# Patient Record
Sex: Female | Born: 1945
Health system: Southern US, Community
[De-identification: ages and names within clinical notes are randomized; demographics above are authoritative.]

## PROBLEM LIST (undated history)

## (undated) DIAGNOSIS — N952 Postmenopausal atrophic vaginitis: Secondary | ICD-10-CM

## (undated) DIAGNOSIS — R8281 Pyuria: Secondary | ICD-10-CM

## (undated) DIAGNOSIS — I10 Essential (primary) hypertension: Secondary | ICD-10-CM

## (undated) DIAGNOSIS — R002 Palpitations: Secondary | ICD-10-CM

## (undated) DIAGNOSIS — D1803 Hemangioma of intra-abdominal structures: Secondary | ICD-10-CM

## (undated) DIAGNOSIS — Z8739 Personal history of other diseases of the musculoskeletal system and connective tissue: Secondary | ICD-10-CM

## (undated) DIAGNOSIS — G4700 Insomnia, unspecified: Secondary | ICD-10-CM

## (undated) DIAGNOSIS — IMO0001 Reserved for inherently not codable concepts without codable children: Secondary | ICD-10-CM

## (undated) DIAGNOSIS — K5790 Diverticulosis of intestine, part unspecified, without perforation or abscess without bleeding: Secondary | ICD-10-CM

## (undated) DIAGNOSIS — N390 Urinary tract infection, site not specified: Secondary | ICD-10-CM

## (undated) DIAGNOSIS — I499 Cardiac arrhythmia, unspecified: Secondary | ICD-10-CM

## (undated) DIAGNOSIS — Z8249 Family history of ischemic heart disease and other diseases of the circulatory system: Secondary | ICD-10-CM

## (undated) DIAGNOSIS — E785 Hyperlipidemia, unspecified: Secondary | ICD-10-CM

## (undated) DIAGNOSIS — I341 Nonrheumatic mitral (valve) prolapse: Secondary | ICD-10-CM

## (undated) DIAGNOSIS — R011 Cardiac murmur, unspecified: Secondary | ICD-10-CM

## (undated) DIAGNOSIS — M1990 Unspecified osteoarthritis, unspecified site: Secondary | ICD-10-CM

## (undated) HISTORY — DX: Palpitations: R00.2

## (undated) HISTORY — DX: Insomnia, unspecified: G47.00

## (undated) HISTORY — DX: Hyperlipidemia, unspecified: E78.5

## (undated) HISTORY — DX: Cardiac arrhythmia, unspecified: I49.9

## (undated) HISTORY — DX: Reserved for inherently not codable concepts without codable children: IMO0001

## (undated) HISTORY — PX: ABDOMINAL HYSTERECTOMY: SHX81

## (undated) HISTORY — DX: Personal history of other diseases of the musculoskeletal system and connective tissue: Z87.39

## (undated) HISTORY — DX: Essential (primary) hypertension: I10

## (undated) HISTORY — PX: PARTIAL HYSTERECTOMY: SHX80

## (undated) HISTORY — DX: Postmenopausal atrophic vaginitis: N95.2

## (undated) HISTORY — DX: Pyuria: R82.81

## (undated) HISTORY — DX: Cardiac murmur, unspecified: R01.1

## (undated) HISTORY — DX: Family history of ischemic heart disease and other diseases of the circulatory system: Z82.49

## (undated) HISTORY — PX: TONSILLECTOMY: SHX5217

## (undated) HISTORY — DX: Nonrheumatic mitral (valve) prolapse: I34.1

## (undated) HISTORY — DX: Hemangioma of intra-abdominal structures: D18.03

## (undated) HISTORY — DX: Urinary tract infection, site not specified: N39.0

---

## 1998-06-07 DIAGNOSIS — N952 Postmenopausal atrophic vaginitis: Secondary | ICD-10-CM | POA: Insufficient documentation

## 1998-07-02 ENCOUNTER — Encounter: Admission: RE | Admit: 1998-07-02 | Discharge: 1998-07-02 | Payer: Self-pay | Admitting: Infectious Diseases

## 1998-07-05 ENCOUNTER — Ambulatory Visit (HOSPITAL_COMMUNITY): Admission: RE | Admit: 1998-07-05 | Discharge: 1998-07-05 | Payer: Self-pay | Admitting: Internal Medicine

## 1998-07-16 ENCOUNTER — Encounter: Admission: RE | Admit: 1998-07-16 | Discharge: 1998-07-16 | Payer: Self-pay | Admitting: Infectious Diseases

## 1998-07-27 ENCOUNTER — Ambulatory Visit (HOSPITAL_COMMUNITY): Admission: RE | Admit: 1998-07-27 | Discharge: 1998-07-27 | Payer: Self-pay | Admitting: Gastroenterology

## 1998-08-06 ENCOUNTER — Encounter: Admission: RE | Admit: 1998-08-06 | Discharge: 1998-08-06 | Payer: Self-pay | Admitting: Infectious Diseases

## 1999-06-16 ENCOUNTER — Encounter: Payer: Self-pay | Admitting: Gastroenterology

## 1999-06-16 ENCOUNTER — Encounter: Admission: RE | Admit: 1999-06-16 | Discharge: 1999-06-16 | Payer: Self-pay | Admitting: Gastroenterology

## 1999-08-04 ENCOUNTER — Other Ambulatory Visit: Admission: RE | Admit: 1999-08-04 | Discharge: 1999-08-04 | Payer: Self-pay | Admitting: Obstetrics and Gynecology

## 2000-07-09 ENCOUNTER — Encounter: Admission: RE | Admit: 2000-07-09 | Discharge: 2000-07-09 | Payer: Self-pay | Admitting: Gastroenterology

## 2000-07-09 ENCOUNTER — Encounter: Payer: Self-pay | Admitting: Gastroenterology

## 2001-07-12 ENCOUNTER — Encounter: Admission: RE | Admit: 2001-07-12 | Discharge: 2001-07-12 | Payer: Self-pay | Admitting: Gastroenterology

## 2001-07-12 ENCOUNTER — Encounter: Payer: Self-pay | Admitting: Gastroenterology

## 2002-07-09 ENCOUNTER — Other Ambulatory Visit: Admission: RE | Admit: 2002-07-09 | Discharge: 2002-07-09 | Payer: Self-pay | Admitting: Obstetrics and Gynecology

## 2002-07-30 ENCOUNTER — Encounter: Payer: Self-pay | Admitting: Gastroenterology

## 2002-07-30 ENCOUNTER — Encounter: Admission: RE | Admit: 2002-07-30 | Discharge: 2002-07-30 | Payer: Self-pay | Admitting: Gastroenterology

## 2003-08-24 ENCOUNTER — Encounter: Admission: RE | Admit: 2003-08-24 | Discharge: 2003-08-24 | Payer: Self-pay | Admitting: Gastroenterology

## 2004-05-01 DIAGNOSIS — IMO0001 Reserved for inherently not codable concepts without codable children: Secondary | ICD-10-CM

## 2004-05-01 HISTORY — DX: Reserved for inherently not codable concepts without codable children: IMO0001

## 2005-11-15 ENCOUNTER — Encounter: Admission: RE | Admit: 2005-11-15 | Discharge: 2005-11-15 | Payer: Self-pay | Admitting: Gastroenterology

## 2008-01-30 ENCOUNTER — Encounter: Admission: RE | Admit: 2008-01-30 | Discharge: 2008-01-30 | Payer: Self-pay | Admitting: Gastroenterology

## 2009-05-27 ENCOUNTER — Encounter: Payer: Self-pay | Admitting: Cardiology

## 2010-01-24 ENCOUNTER — Ambulatory Visit: Payer: Self-pay | Admitting: Cardiology

## 2010-02-02 ENCOUNTER — Ambulatory Visit: Payer: Self-pay | Admitting: Cardiology

## 2010-03-30 ENCOUNTER — Encounter: Admission: RE | Admit: 2010-03-30 | Discharge: 2010-03-30 | Payer: Self-pay | Admitting: Gastroenterology

## 2010-06-14 ENCOUNTER — Ambulatory Visit (INDEPENDENT_AMBULATORY_CARE_PROVIDER_SITE_OTHER): Payer: Medicare Other | Admitting: Cardiology

## 2010-06-14 DIAGNOSIS — Z79899 Other long term (current) drug therapy: Secondary | ICD-10-CM

## 2010-06-14 DIAGNOSIS — E7800 Pure hypercholesterolemia, unspecified: Secondary | ICD-10-CM

## 2010-09-05 ENCOUNTER — Other Ambulatory Visit: Payer: Self-pay | Admitting: Cardiology

## 2010-09-05 DIAGNOSIS — I059 Rheumatic mitral valve disease, unspecified: Secondary | ICD-10-CM

## 2010-09-05 DIAGNOSIS — Z79899 Other long term (current) drug therapy: Secondary | ICD-10-CM

## 2010-09-05 DIAGNOSIS — E7800 Pure hypercholesterolemia, unspecified: Secondary | ICD-10-CM

## 2010-10-12 ENCOUNTER — Encounter: Payer: Self-pay | Admitting: *Deleted

## 2010-10-12 ENCOUNTER — Other Ambulatory Visit: Payer: Self-pay | Admitting: Cardiology

## 2010-10-12 ENCOUNTER — Other Ambulatory Visit (INDEPENDENT_AMBULATORY_CARE_PROVIDER_SITE_OTHER): Payer: Medicare Other | Admitting: *Deleted

## 2010-10-12 DIAGNOSIS — I059 Rheumatic mitral valve disease, unspecified: Secondary | ICD-10-CM

## 2010-10-12 DIAGNOSIS — Z79899 Other long term (current) drug therapy: Secondary | ICD-10-CM

## 2010-10-12 DIAGNOSIS — E7800 Pure hypercholesterolemia, unspecified: Secondary | ICD-10-CM

## 2010-10-12 LAB — LIPID PANEL
Cholesterol: 259 mg/dL — ABNORMAL HIGH (ref 0–200)
HDL: 65.3 mg/dL (ref >39.00)
Total CHOL/HDL Ratio: 4
VLDL: 24.6 mg/dL (ref 0.0–40.0)

## 2010-10-12 LAB — HEPATIC FUNCTION PANEL
ALT: 15 U/L (ref 0–35)
Albumin: 4.7 g/dL (ref 3.5–5.2)
Total Bilirubin: 1.1 mg/dL (ref 0.3–1.2)

## 2010-10-12 LAB — BASIC METABOLIC PANEL
CO2: 31 mEq/L (ref 19–32)
Chloride: 105 mEq/L (ref 96–112)
GFR: 84.82 mL/min (ref >60.00)
Glucose, Bld: 98 mg/dL (ref 70–99)
Sodium: 141 mEq/L (ref 135–145)

## 2010-10-14 ENCOUNTER — Other Ambulatory Visit: Payer: Medicare Other | Admitting: *Deleted

## 2010-10-14 ENCOUNTER — Other Ambulatory Visit: Payer: Self-pay | Admitting: *Deleted

## 2010-10-14 ENCOUNTER — Encounter: Payer: Self-pay | Admitting: Cardiology

## 2010-10-14 ENCOUNTER — Ambulatory Visit (INDEPENDENT_AMBULATORY_CARE_PROVIDER_SITE_OTHER): Payer: Medicare Other | Admitting: Cardiology

## 2010-10-14 DIAGNOSIS — D1803 Hemangioma of intra-abdominal structures: Secondary | ICD-10-CM

## 2010-10-14 DIAGNOSIS — I059 Rheumatic mitral valve disease, unspecified: Secondary | ICD-10-CM

## 2010-10-14 DIAGNOSIS — E7800 Pure hypercholesterolemia, unspecified: Secondary | ICD-10-CM

## 2010-10-14 DIAGNOSIS — I341 Nonrheumatic mitral (valve) prolapse: Secondary | ICD-10-CM

## 2010-10-14 MED ORDER — ATORVASTATIN CALCIUM 10 MG PO TABS: 10.0000 mg | ORAL_TABLET | Freq: Every day | ORAL | Status: DC

## 2010-10-14 NOTE — Progress Notes (Signed)
[name] Date of Birth:  December 14, 1945 [facility] / [facility] [address]   [address] [place], [place]  [address] [phone]           Fax   [phone]  HPI: This pleasant 65 year old woman is seen for a four-month followup office visit.  She has a history of mitral valve prolapse and hypercholesterolemia.  She has not been expressing any symptoms from her mitral valve prolapse.  She denies any palpitations or chest pain.  Her last echocardiogram was 12/02/03 and showed normal left ventricular function and mild mitral valve prolapse.  She does not have any history of ischemic heart disease.  She had a normal treadmill Cardiolite stress test on 12/23/04.She has a history of hypercholesterolemia.  In the past she had been on Lipitor but decided that she did not want to be on it.  She was concerned about possible long-term sequelae.  However now with her LDL cholesterol above 173 she is willing to go back on low dose of Lipitor.  Current Outpatient Prescriptions  Medication Sig Dispense Refill  . Aspirin Buf,CaCarb-MgCarb-MgO, (BUFFERIN PO) Take 1 tablet by mouth as needed.        Marland Kitchen atenolol (TENORMIN) 50 MG tablet Take 25 mg by mouth daily.        Marland Kitchen CALCIUM PO Take 3 tablets by mouth daily. (  Bone Up )       . cetirizine (ZYRTEC) 10 MG tablet Take 10 mg by mouth daily.        Marland Kitchen estradiol (ESTRING) 2 MG vaginal ring Place 2 mg vaginally every 3 (three) months. follow package directions       . multivitamin (THERAGRAN) per tablet Take 1 tablet by mouth daily.        . Nutritional Supplements (MELATONIN PO) Take 2 mg by mouth at bedtime.        Marland Kitchen atorvastatin (LIPITOR) 10 MG tablet Take 1 tablet (10 mg total) by mouth daily. Take 1/2 tablet daily  45 tablet  3    Allergies  Allergen Reactions  . Ceclor (Cefaclor)   . Erythromycin   . Flagyl (Metronidazole Hcl)   . Levaquin   . Restoril     Patient Active Problem List  Diagnoses  . Mitral valve prolapse  .  Hypercholesterolemia  . Hepatic hemangioma    History  Smoking status  . Never Smoker   Smokeless tobacco  . Never Used    History  Alcohol Use No    Family History  Problem Relation Age of Onset  . Arthritis Father   . Cancer Father   . Arthritis Mother   . Cancer Mother     skin  . Hypertension Mother   . Stroke Mother   . Heart attack Brother   . Hypertension Brother   . Hypertension Brother   . Diabetes Brother   . Coronary artery disease Brother     CABG x1  . Multiple sclerosis Brother   . Osteopenia Mother     DDD  . Ulcers Mother   . Hyperlipidemia Mother     Review of Systems: The patient denies any heat or cold intolerance.  No weight gain or weight loss.  The patient denies headaches or blurry vision.  There is no cough or sputum production.  The patient denies dizziness.  There is no hematuria or hematochezia.  The patient denies any muscle aches or arthritis.  The patient denies any rash.  The patient denies  frequent falling or instability.  There is no history of depression or anxiety.  All other systems were reviewed and are negative.   Physical Exam: Filed Vitals:   10/14/10 1352  BP: 130/70  Pulse: 72   The general appearance feels a well-developed well-nourished woman in no distress.The head and neck exam reveals pupils equal and reactive.  Extraocular movements are full.  There is no scleral icterus.  The mouth and pharynx are normal.  The neck is supple.  The carotids reveal no bruits.  The jugular venous pressure is normal.  The  thyroid is not enlarged.  There is no lymphadenopathy.  The chest is clear to percussion and auscultation.  There are no rales or rhonchi.  Expansion of the chest is symmetrical.  The precordium is quiet.  The first heart sound is normal.  The second heart sound is physiologically split.  There is A soft apical mid systolic click with late systolic murmur grade 1/6.  There is no abnormal lift or heave.  The abdomen is soft  and nontender.  The bowel sounds are normal.  The liver and spleen are not enlarged.  There are no abdominal masses.  There are no abdominal bruits.  Extremities reveal good pedal pulses.  There is no phlebitis or edema.  There is no cyanosis or clubbing.  Strength is normal and symmetrical in all extremities.  There is no lateralizing weakness.  There are no sensory deficits.  The skin is warm and dry.  There is no rash.   Assessment / Plan: Restart statin therapy for hypercholesterolemia in the form of Lipitor 10 mg taking one half tablet daily.

## 2010-10-14 NOTE — Assessment & Plan Note (Signed)
The patient has a long history of mitral valve prolapse.  She has been on long-term beta blocker in the form of atenolol 12.5 mg daily.  Her blood pressure tends to run low he has not been expressing any chest pain.  She does not have any history of ischemic heart disease and she had a normal stress Cardiolite in 12/23/04

## 2010-10-14 NOTE — Telephone Encounter (Signed)
Faxed Rx. For Lipitor to [facility]

## 2010-10-14 NOTE — Assessment & Plan Note (Signed)
The patient is not currently on any statin therapy.  Her lipids this time were disappointingly highWith an LDL of 173.She is already watching her diet and exercise.  Because of her family history of coronary disease we will put her on low dose Lipitor in the form of half of a 10 mg tablet daily

## 2010-10-17 ENCOUNTER — Other Ambulatory Visit: Payer: Self-pay | Admitting: *Deleted

## 2010-10-17 DIAGNOSIS — E7800 Pure hypercholesterolemia, unspecified: Secondary | ICD-10-CM

## 2010-10-17 MED ORDER — ATORVASTATIN CALCIUM 10 MG PO TABS: ORAL_TABLET | ORAL | Status: DC

## 2011-02-02 DIAGNOSIS — N765 Ulceration of vagina: Secondary | ICD-10-CM | POA: Insufficient documentation

## 2011-02-09 ENCOUNTER — Ambulatory Visit (INDEPENDENT_AMBULATORY_CARE_PROVIDER_SITE_OTHER): Payer: Medicare Other | Admitting: *Deleted

## 2011-02-09 DIAGNOSIS — E7800 Pure hypercholesterolemia, unspecified: Secondary | ICD-10-CM

## 2011-02-09 LAB — BASIC METABOLIC PANEL
BUN: 17 mg/dL (ref 6–23)
Chloride: 103 mEq/L (ref 96–112)
GFR: 82.13 mL/min (ref >60.00)
Glucose, Bld: 96 mg/dL (ref 70–99)
Potassium: 3.9 mEq/L (ref 3.5–5.1)
Sodium: 139 mEq/L (ref 135–145)

## 2011-02-09 LAB — LIPID PANEL
Cholesterol: 173 mg/dL (ref 0–200)
HDL: 70.1 mg/dL (ref >39.00)
LDL Cholesterol: 81 mg/dL (ref 0–99)
Triglycerides: 109 mg/dL (ref 0.0–149.0)

## 2011-02-09 LAB — HEPATIC FUNCTION PANEL
Albumin: 4.4 g/dL (ref 3.5–5.2)
Alkaline Phosphatase: 53 U/L (ref 39–117)
Total Bilirubin: 0.8 mg/dL (ref 0.3–1.2)

## 2011-02-13 ENCOUNTER — Ambulatory Visit (INDEPENDENT_AMBULATORY_CARE_PROVIDER_SITE_OTHER): Payer: Medicare Other | Admitting: Nurse Practitioner

## 2011-02-13 ENCOUNTER — Encounter: Payer: Self-pay | Admitting: Nurse Practitioner

## 2011-02-13 VITALS — BP 138/80 | HR 66 | Resp 20 | Ht 64.5 in | Wt 127.0 lb

## 2011-02-13 DIAGNOSIS — E785 Hyperlipidemia, unspecified: Secondary | ICD-10-CM

## 2011-02-13 DIAGNOSIS — E7800 Pure hypercholesterolemia, unspecified: Secondary | ICD-10-CM

## 2011-02-13 DIAGNOSIS — I059 Rheumatic mitral valve disease, unspecified: Secondary | ICD-10-CM

## 2011-02-13 DIAGNOSIS — I341 Nonrheumatic mitral (valve) prolapse: Secondary | ICD-10-CM

## 2011-02-13 NOTE — Assessment & Plan Note (Signed)
Has had some palpitations but now doing better since her stress level has come down. She may use extra Atenolol prn. We will see her back in 4 months. Regular activity is encouraged. Patient is agreeable to this plan and will call if any problems develop in the interim.

## 2011-02-13 NOTE — Patient Instructions (Signed)
Stay on the 5 mg of Lipitor. Exercise is encouraged You may use extra Atenolol as needed for palpitations  Call for any problems  We will see you back in 6 months.

## 2011-02-13 NOTE — Assessment & Plan Note (Signed)
She has had a nice response with low dose Lipitor. She is willing to continue. Will recheck in 6 months.

## 2011-02-13 NOTE — Progress Notes (Signed)
Shannon Brennan Date of Birth: 1946/04/24   History of Present Illness: Shannon Brennan is seen today for her 4 month visit. She is seen for Dr. Patty Sermons. She is doing well. No chest pain or shortness of breath. She had noted more in the way of palpitations but it was stress related. She has had to have 2 vaginal biopsies which caused her some anxiety. She is now doing well. Exercises some. Back on low dose Lipitor and is tolerating without problems. Labs were checked recently and are reviewed with her. She has had a nice response with low dose Lipitor.   Current Outpatient Prescriptions on File Prior to Visit  Medication Sig Dispense Refill  . atenolol (TENORMIN) 50 MG tablet Take by mouth daily. Taking only a half of a 25 mg tablet      . atorvastatin (LIPITOR) 10 MG tablet Take 1/2 tablet daily  45 tablet  3  . CALCIUM PO Take 3 tablets by mouth daily. (  Bone Up )       . cetirizine (ZYRTEC) 10 MG tablet Take 10 mg by mouth daily.        . multivitamin (THERAGRAN) per tablet Take 1 tablet by mouth daily.        . Nutritional Supplements (MELATONIN PO) Take 2 mg by mouth at bedtime.        . Aspirin Buf,CaCarb-MgCarb-MgO, (BUFFERIN PO) Take 1 tablet by mouth as needed.        Marland Kitchen atorvastatin (LIPITOR) 10 MG tablet Take 10 mg by mouth. Take 1/2 tablet daily       . DISCONTD: estradiol (ESTRING) 2 MG vaginal ring Place 2 mg vaginally every 3 (three) months. follow package directions         Allergies  Allergen Reactions  . Ceclor (Cefaclor)   . Erythromycin   . Flagyl (Metronidazole Hcl)   . Levaquin   . Restoril     Past Medical History  Diagnosis Date  . Hyperlipidemia     tolerates low dose Lipitor  . Mitral valve prolapse     last echo in 2005 showing mild MVP with normal LV function  . Hepatic hemangioma     history of -- followe by Dr. Matthias Hughs  . Palpitations   . Family history of cardiovascular disease   . Normal nuclear stress test 2006    Past Surgical History    Procedure Date  . Partial hysterectomy   . Tonsillectomy   . Shoulder surgery     left shoulder, by Dr. Darrelyn Hillock    History  Smoking status  . Never Smoker   Smokeless tobacco  . Never Used    History  Alcohol Use No    Family History  Problem Relation Age of Onset  . Arthritis Father   . Cancer Father   . Arthritis Mother   . Cancer Mother     skin  . Hypertension Mother   . Stroke Mother   . Heart attack Brother   . Hypertension Brother   . Hypertension Brother   . Diabetes Brother   . Coronary artery disease Brother     CABG x1  . Multiple sclerosis Brother   . Osteopenia Mother     DDD  . Ulcers Mother   . Hyperlipidemia Mother     Review of Systems: The review of systems is per the HPI.  All other systems were reviewed and are negative.  Physical Exam: BP 138/80  Pulse 66  Resp  20  Ht 5' 4.5" (1.638 m)  Wt 127 lb (57.607 kg)  BMI 21.46 kg/m2 Patient is very pleasant and in no acute distress. Skin is warm and dry. Color is normal.  HEENT is unremarkable. Normocephalic/atraumatic. PERRL. Sclera are nonicteric. Neck is supple. No masses. No JVD. Lungs are clear. Cardiac exam shows a regular rate and rhythm. Abdomen is soft. Extremities are without edema. Gait and ROM are intact. No gross neurologic deficits noted.   LABORATORY DATA:   Assessment / Plan:

## 2011-03-14 ENCOUNTER — Other Ambulatory Visit: Payer: Self-pay | Admitting: Cardiology

## 2011-06-14 ENCOUNTER — Encounter: Payer: Self-pay | Admitting: Cardiology

## 2011-06-14 ENCOUNTER — Telehealth: Payer: Self-pay | Admitting: *Deleted

## 2011-06-14 ENCOUNTER — Ambulatory Visit (INDEPENDENT_AMBULATORY_CARE_PROVIDER_SITE_OTHER): Payer: Medicare Other | Admitting: Cardiology

## 2011-06-14 VITALS — BP 118/70 | HR 75 | Ht 64.0 in | Wt 127.0 lb

## 2011-06-14 DIAGNOSIS — I059 Rheumatic mitral valve disease, unspecified: Secondary | ICD-10-CM

## 2011-06-14 DIAGNOSIS — I341 Nonrheumatic mitral (valve) prolapse: Secondary | ICD-10-CM

## 2011-06-14 DIAGNOSIS — E7800 Pure hypercholesterolemia, unspecified: Secondary | ICD-10-CM

## 2011-06-14 LAB — BASIC METABOLIC PANEL
BUN: 18 mg/dL (ref 6–23)
Calcium: 9.6 mg/dL (ref 8.4–10.5)
Creatinine, Ser: 0.7 mg/dL (ref 0.4–1.2)
GFR: 83.33 mL/min (ref >60.00)
Glucose, Bld: 97 mg/dL (ref 70–99)
Potassium: 4.4 mEq/L (ref 3.5–5.1)
Sodium: 141 mEq/L (ref 135–145)

## 2011-06-14 LAB — LIPID PANEL
Cholesterol: 170 mg/dL (ref 0–200)
LDL Cholesterol: 84 mg/dL (ref 0–99)
Total CHOL/HDL Ratio: 2
Triglycerides: 76 mg/dL (ref 0.0–149.0)

## 2011-06-14 LAB — HEPATIC FUNCTION PANEL
ALT: 23 U/L (ref 0–35)
Total Bilirubin: 1.1 mg/dL (ref 0.3–1.2)

## 2011-06-14 NOTE — Telephone Encounter (Signed)
Message copied by Burnell Blanks on Wed Jun 14, 2011  5:28 PM ------      Message from: Cassell Clement      Created: Wed Jun 14, 2011  5:24 PM       Please report to patient.  The recent labs are stable. Continue same medication and careful diet.

## 2011-06-14 NOTE — Patient Instructions (Signed)
Will obtain labs today and call you with the results Your physician recommends that you schedule a follow-up appointment in: 4 months with fasting labs (lp/bmet/hfp)

## 2011-06-14 NOTE — Telephone Encounter (Signed)
Advised of labs 

## 2011-06-14 NOTE — Progress Notes (Signed)
Shannon Brennan Date of Birth:  1945/05/16 [facility] [address] [place], [place]  [address] [phone]  Fax   [phone]  HPI: This pleasant 66 year old woman is seen for a scheduled followup office visit.  She has a past history of mitral valve prolapse and palpitations.  She also has a past history of hypercholesterolemia and she has a history of hepatic hemangiomas.  His last visit she has been feeling well.  Her weight has been stable.  She has managed to make it through the winter so far without serious upper respiratory illness.  Current Outpatient Prescriptions  Medication Sig Dispense Refill  . atenolol (TENORMIN) 25 MG tablet TAKE 1 TABLET DAILY OR AS DIRECTED  90 tablet  2  . atorvastatin (LIPITOR) 10 MG tablet Take 1/2 tablet daily  45 tablet  3  . CALCIUM PO Take 3 tablets by mouth daily. (  Bone Up )       . cetirizine (ZYRTEC) 10 MG tablet Take 10 mg by mouth daily.        . Estradiol 10 MCG TABS Place vaginally 2 (two) times a week.        . multivitamin (THERAGRAN) per tablet Take 1 tablet by mouth daily.        . Nutritional Supplements (MELATONIN PO) Take 2 mg by mouth at bedtime.        Marland Kitchen atorvastatin (LIPITOR) 10 MG tablet Take 10 mg by mouth. Take 1/2 tablet daily       . DISCONTD: atenolol (TENORMIN) 50 MG tablet Take by mouth daily. Taking only a half of a 25 mg tablet        Allergies  Allergen Reactions  . Ceclor (Cefaclor)   . Erythromycin   . Flagyl (Metronidazole Hcl)   . Levaquin   . Restoril     Patient Active Problem List  Diagnoses  . Mitral valve prolapse  . Hypercholesterolemia  . Hepatic hemangioma    History  Smoking status  . Never Smoker   Smokeless tobacco  . Never Used    History  Alcohol Use No    Family History  Problem Relation Age of Onset  . Arthritis Father   . Cancer Father   . Arthritis Mother   . Cancer Mother     skin  . Hypertension Mother   . Stroke Mother   . Heart attack  Brother   . Hypertension Brother   . Hypertension Brother   . Diabetes Brother   . Coronary artery disease Brother     CABG x1  . Multiple sclerosis Brother   . Osteopenia Mother     DDD  . Ulcers Mother   . Hyperlipidemia Mother     Review of Systems: The patient denies any heat or cold intolerance.  No weight gain or weight loss.  The patient denies headaches or blurry vision.  There is no cough or sputum production.  The patient denies dizziness.  There is no hematuria or hematochezia.  The patient denies any muscle aches or arthritis.  The patient denies any rash.  The patient denies frequent falling or instability.  There is no history of depression or anxiety.  All other systems were reviewed and are negative.   Physical Exam: Filed Vitals:   06/14/11 0919  BP: 118/70  Pulse: 75   General appearance reveals a well-developed well-nourished woman in no distress.Pupils equal and reactive.   Extraocular Movements are full.  There is no scleral  icterus.  The mouth and pharynx are normal.  The neck is supple.  The carotids reveal no bruits.  The jugular venous pressure is normal.  The thyroid is not enlarged.  There is no lymphadenopathy.  The chest is clear to percussion and auscultation. There are no rales or rhonchi. Expansion of the chest is symmetrical.  The precordium is quiet.  The first heart sound is normal.  The second heart sound is physiologically split.  There is no murmur gallop rub and there is a sharp midsystolic click.  There is no abnormal lift or heave. The abdomen is soft and nontender. Bowel sounds are normal. The liver and spleen are not enlarged. There Are no abdominal masses. There are no bruits.  Extremities show no phlebitis or edema.  Pedal pulses are good.Strength is normal and symmetrical in all extremities.  There is no lateralizing weakness.  There are no sensory deficits.  The skin is warm and dry.  There is no rash.     Assessment /  Plan: Continue same medication.  Blood work today pending.  Recheck in 4 months

## 2011-06-14 NOTE — Assessment & Plan Note (Signed)
The patient has a history of hypercholesterolemia.  She is taking low dose atorvastatin and is not having a significant myalgias.  Blood work today is pending.

## 2011-06-14 NOTE — Assessment & Plan Note (Signed)
She is not having symptoms from her mitral valve prolapse.  She has had no recent palpitations.  Normally she only experiences palpitations if she is extremely fatigued.  She has not been expressing any chest pain or angina.  She had a normal nuclear stress test in 2006 because of the strong family history.

## 2011-06-14 NOTE — Progress Notes (Signed)
Quick Note:  Please report to patient. The recent labs are stable. Continue same medication and careful diet. ______ 

## 2011-06-20 DIAGNOSIS — N765 Ulceration of vagina: Secondary | ICD-10-CM

## 2011-06-20 DIAGNOSIS — N952 Postmenopausal atrophic vaginitis: Secondary | ICD-10-CM

## 2011-08-02 ENCOUNTER — Encounter (INDEPENDENT_AMBULATORY_CARE_PROVIDER_SITE_OTHER): Payer: Medicare Other | Admitting: Obstetrics and Gynecology

## 2011-08-02 DIAGNOSIS — N7689 Other specified inflammation of vagina and vulva: Secondary | ICD-10-CM

## 2011-08-02 DIAGNOSIS — N72 Inflammatory disease of cervix uteri: Secondary | ICD-10-CM

## 2011-08-30 ENCOUNTER — Encounter: Payer: Self-pay | Admitting: Cardiology

## 2011-10-02 ENCOUNTER — Other Ambulatory Visit (INDEPENDENT_AMBULATORY_CARE_PROVIDER_SITE_OTHER): Payer: Medicare Other

## 2011-10-02 DIAGNOSIS — E7800 Pure hypercholesterolemia, unspecified: Secondary | ICD-10-CM

## 2011-10-02 LAB — LIPID PANEL
Cholesterol: 171 mg/dL (ref 0–200)
HDL: 61.5 mg/dL (ref >39.00)
LDL Cholesterol: 81 mg/dL (ref 0–99)
Total CHOL/HDL Ratio: 3
Triglycerides: 145 mg/dL (ref 0.0–149.0)

## 2011-10-02 LAB — HEPATIC FUNCTION PANEL
ALT: 24 U/L (ref 0–35)
Total Protein: 7.1 g/dL (ref 6.0–8.3)

## 2011-10-02 LAB — BASIC METABOLIC PANEL: GFR: 90.25 mL/min (ref >60.00)

## 2011-10-02 NOTE — Progress Notes (Signed)
Quick Note:  Please make copy of labs for patient visit. ______ 

## 2011-10-06 ENCOUNTER — Ambulatory Visit (INDEPENDENT_AMBULATORY_CARE_PROVIDER_SITE_OTHER): Payer: Medicare Other | Admitting: Cardiology

## 2011-10-06 ENCOUNTER — Encounter: Payer: Self-pay | Admitting: Cardiology

## 2011-10-06 VITALS — BP 120/70 | HR 66 | Ht 64.0 in | Wt 127.0 lb

## 2011-10-06 DIAGNOSIS — M5430 Sciatica, unspecified side: Secondary | ICD-10-CM

## 2011-10-06 DIAGNOSIS — E7800 Pure hypercholesterolemia, unspecified: Secondary | ICD-10-CM

## 2011-10-06 DIAGNOSIS — I341 Nonrheumatic mitral (valve) prolapse: Secondary | ICD-10-CM

## 2011-10-06 DIAGNOSIS — I059 Rheumatic mitral valve disease, unspecified: Secondary | ICD-10-CM

## 2011-10-06 MED ORDER — ATORVASTATIN CALCIUM 10 MG PO TABS: ORAL_TABLET | ORAL | Status: DC

## 2011-10-06 MED ORDER — ATENOLOL 25 MG PO TABS: ORAL_TABLET | ORAL | Status: DC

## 2011-10-06 NOTE — Assessment & Plan Note (Signed)
No chest pain or palpitations.  No dizziness or syncope.

## 2011-10-06 NOTE — Assessment & Plan Note (Signed)
Because of her back pain she was unable to exercise for the past several weeks and is just now getting back into regular careful walking.  She is avoiding playing volleyball which she previously had been doing

## 2011-10-06 NOTE — Assessment & Plan Note (Signed)
Her lipids are remaining stable on low-dose Lipitor.  She's not having any myalgias.

## 2011-10-06 NOTE — Patient Instructions (Signed)
Your physician recommends that you continue on your current medications as directed. Please refer to the Current Medication list given to you today. Your physician wants you to follow-up in: 4 months with fasting labs You will receive a reminder letter in the mail two months in advance. If you don't receive a letter, please call our office to schedule the follow-up appointment.  

## 2011-10-06 NOTE — Progress Notes (Signed)
Shannon Brennan Date of Birth:  01-01-1946 [facility] [address] [place], [place]  [address] [phone]  Fax   [phone]  HPI: This pleasant 66 year old woman is seen for a four-month followup office visit.  She has a past history of mitral valve prolapse and a history of hypercholesterolemia.  She also has a history of hepatic hemangiomas.  Since we last saw her she has been having a terrible time with painful sciatica.  He was unable to tolerate Flexeril or prednisone.  She finally started seeing a chiropractor who has helped her.  She has not been having any cardiac symptoms.  Current Outpatient Prescriptions  Medication Sig Dispense Refill  . atenolol (TENORMIN) 25 MG tablet One daily or as directed  90 tablet  2  . atorvastatin (LIPITOR) 10 MG tablet Take 1/2 tablet daily or as directed  90 tablet  3  . CALCIUM PO Take 3 tablets by mouth daily. (  Bone Up )       . cetirizine (ZYRTEC) 10 MG tablet Take 10 mg by mouth daily.        . Estradiol 10 MCG TABS Place vaginally 2 (two) times a week.        . multivitamin (THERAGRAN) per tablet Take 1 tablet by mouth daily.        . Nutritional Supplements (MELATONIN PO) Take 2 mg by mouth at bedtime.        Marland Kitchen DISCONTD: atenolol (TENORMIN) 25 MG tablet TAKE 1 TABLET DAILY OR AS DIRECTED  90 tablet  2  . DISCONTD: atorvastatin (LIPITOR) 10 MG tablet Take 1/2 tablet daily  45 tablet  3  . atorvastatin (LIPITOR) 10 MG tablet Take 10 mg by mouth. Take 1/2 tablet daily         Allergies  Allergen Reactions  . Ceclor (Cefaclor)   . Erythromycin   . Flagyl (Metronidazole Hcl)   . Levofloxacin   . Restoril   . Wellbutrin (Bupropion Hcl)     Patient Active Problem List  Diagnoses  . Mitral valve prolapse  . Hypercholesterolemia  . Hepatic hemangioma  . Vaginal atrophy    History  Smoking status  . Never Smoker   Smokeless tobacco  . Never Used    History  Alcohol Use No    Family History    Problem Relation Age of Onset  . Arthritis Father   . Cancer Father   . Arthritis Mother   . Cancer Mother     skin  . Hypertension Mother   . Stroke Mother   . Heart attack Brother   . Hypertension Brother   . Hypertension Brother   . Diabetes Brother   . Coronary artery disease Brother     CABG x1  . Multiple sclerosis Brother   . Osteopenia Mother     DDD  . Ulcers Mother   . Hyperlipidemia Mother     Review of Systems: The patient denies any heat or cold intolerance.  No weight gain or weight loss.  The patient denies headaches or blurry vision.  There is no cough or sputum production.  The patient denies dizziness.  There is no hematuria or hematochezia.  The patient denies any muscle aches or arthritis.  The patient denies any rash.  The patient denies frequent falling or instability.  There is no history of depression or anxiety.  All other systems were reviewed and are negative.   Physical Exam: Filed Vitals:   10/06/11 4010  BP: 120/70  Pulse: 66   appearance reveals a well-developed well-nourished woman in no distress.The head and neck exam reveals pupils equal and reactive.  Extraocular movements are full.  There is no scleral icterus.  The mouth and pharynx are normal.  The neck is supple.  The carotids reveal no bruits.  The jugular venous pressure is normal.  The  thyroid is not enlarged.  There is no lymphadenopathy.  The chest is clear to percussion and auscultation.  There are no rales or rhonchi.  Expansion of the chest is symmetrical.  The precordium is quiet.  The first heart sound is normal.  The second heart sound is physiologically split.  There is no murmur gallop rub.  There is a sharp midsystolic click and apex  There is no abnormal lift or heave.  The abdomen is soft and nontender.  The bowel sounds are normal.  The liver and spleen are not enlarged.  There are no abdominal masses.  There are no abdominal bruits.  Extremities reveal good pedal pulses.  There  is no phlebitis or edema.  There is no cyanosis or clubbing.  Strength is normal and symmetrical in all extremities.  There is no lateralizing weakness.  There are no sensory deficits.  The skin is warm and dry.  There is no rash.      Assessment / Plan: Continue same medication and be rechecked in 4 months for office visit and lab work.

## 2011-10-26 ENCOUNTER — Ambulatory Visit (INDEPENDENT_AMBULATORY_CARE_PROVIDER_SITE_OTHER): Payer: Medicare Other | Admitting: Obstetrics and Gynecology

## 2011-10-26 ENCOUNTER — Encounter: Payer: Self-pay | Admitting: Obstetrics and Gynecology

## 2011-10-26 VITALS — BP 122/64 | Ht 64.5 in | Wt 128.0 lb

## 2011-10-26 DIAGNOSIS — N952 Postmenopausal atrophic vaginitis: Secondary | ICD-10-CM

## 2011-10-26 DIAGNOSIS — Z719 Counseling, unspecified: Secondary | ICD-10-CM

## 2011-10-26 MED ORDER — ESTRADIOL 10 MCG VA TABS: 2.0000 | ORAL_TABLET | VAGINAL | Status: DC

## 2011-10-26 NOTE — Progress Notes (Signed)
Last Pap: 2004 WNL: Yes Regular Periods:no Contraception: Hysterectomy  Monthly Breast exam:yes Tetanus<22yrs:no Nl.Bladder Function:yes Daily BMs:yes Healthy Diet:yes Calcium:yes Mammogram:yes 07/2011 Exercise:yes Seatbelt: yes Abuse at home: no Stressful work:no Sigmoid-colonoscopy: 06/2007 WNL Bone density: 2009 (followed by Dr. Felipa Eth)  Subjective:    Shannon Brennan is a 66 y.o. female G2P0011 who presents for annual exam.  The patient has no complaints today. Specifically, she denies any vaginal bleeding, abnl vaginal discharge, abdominal pain, nausea or vomiting  The following portions of the patient's history were reviewed and updated as appropriate: allergies, current medications, past family history, past medical history, past social history, past surgical history and problem list.  Review of Systems Pertinent items are noted in HPI. Gastrointestinal:No change in bowel habits, no abdominal pain, no rectal bleeding Genitourinary:negative for dysuria, frequency, hematuria, nocturia and urinary incontinence    Objective:     BP 122/64  Ht 5' 4.5" (1.638 m)  Wt 128 lb (58.06 kg)  BMI 21.63 kg/m2  Weight:  Wt Readings from Last 1 Encounters:  10/26/11 128 lb (58.06 kg)     BMI: Body mass index is 21.63 kg/(m^2). General Appearance: Alert, appropriate appearance for age. No acute distress HEENT: Grossly normal Neck / Thyroid: Supple, no masses, nodes or enlargement Lungs: clear to auscultation bilaterally Back: No CVA tenderness Breast Exam: No masses or nodes.No dimpling, nipple retraction or discharge. Cardiovascular: Regular rate and rhythm. S1, S2, no murmur Gastrointestinal: Soft, non-tender, no masses or organomegaly Pelvic Exam: External genitalia: normal general appearance Vaginal: atrophic mucosa, vaginal vault, well suspended.  atrophy improved and no erosion Cervix: removed surgically Adnexa: normal bimanual exam Uterus: removed  surgically Rectovaginal: normal rectal, no masses Lymphatic Exam: Non-palpable nodes in neck, clavicular, axillary, or inguinal regions Skin: no rash or abnormalities Neurologic: Normal gait and speech, no tremor  Psychiatric: Alert and oriented, appropriate affect.    Urinalysis:Not done      Assessment:    Atrpohic vaginitis improved    Plan:    All questions answered. Continue vagifem 2times weekly   Follow-up:  for annual exam

## 2011-11-03 ENCOUNTER — Other Ambulatory Visit: Payer: Self-pay | Admitting: Obstetrics and Gynecology

## 2011-11-07 NOTE — Telephone Encounter (Signed)
vph pt 

## 2011-11-08 ENCOUNTER — Other Ambulatory Visit: Payer: Self-pay | Admitting: Obstetrics and Gynecology

## 2011-11-08 NOTE — Telephone Encounter (Signed)
Chandra/pharm/epic

## 2011-11-08 NOTE — Telephone Encounter (Signed)
Pc to pharm per telephone call. Vagifem 1 pv 2x/weekly #24 with 3rf's verified. Spoke with Kathy(pharm).

## 2011-11-08 NOTE — Telephone Encounter (Signed)
Tc to pt per telephone call. Pt informed rx verified for Vagifem 1 tablet pv 2x/weekly #24 with 3rf's. Pt voices understanding.

## 2011-12-27 ENCOUNTER — Telehealth: Payer: Self-pay | Admitting: *Deleted

## 2011-12-27 NOTE — Telephone Encounter (Signed)
Requested chart from Onalee Hua

## 2011-12-27 NOTE — Telephone Encounter (Signed)
Last EKG?

## 2011-12-28 ENCOUNTER — Other Ambulatory Visit: Payer: Self-pay | Admitting: Neurological Surgery

## 2012-01-02 NOTE — Pre-Procedure Instructions (Signed)
19 Shannon Brennan  01/02/2012   Your procedure is scheduled on:  Thurs, Sept 5 @ 7:30 AM  Report to [facility] at 5:30 AM.  Call this number if you have problems the morning of surgery: [phone]   Remember:   Do not eat food:After Midnight.  Take these medicines the morning of surgery with A SIP OF WATER: Atenolol(Tenormin),Cetirizine(Zyrtec), and Pain Pill(if needed)   Do not wear jewelry, make-up or nail polish.  Do not wear lotions, powders, or perfumes.   Do not shave 48 hours prior to surgery.   Do not bring valuables to the hospital.  Contacts, dentures or bridgework may not be worn into surgery.  Leave suitcase in the car. After surgery it may be brought to your room.  For patients admitted to the hospital, checkout time is 11:00 AM the day of discharge.   Patients discharged the day of surgery will not be allowed to drive home.  Special Instructions: CHG Shower Use Special Wash: 1/2 bottle night before surgery and 1/2 bottle morning of surgery.   Please read over the following fact sheets that you were given: Pain Booklet, Coughing and Deep Breathing, MRSA Information and Surgical Site Infection Prevention

## 2012-01-03 ENCOUNTER — Encounter (HOSPITAL_COMMUNITY): Payer: Self-pay

## 2012-01-03 ENCOUNTER — Encounter (HOSPITAL_COMMUNITY)
Admission: RE | Admit: 2012-01-03 | Discharge: 2012-01-03 | Disposition: A | Discharge status: Home / Self Care | Payer: Medicare Other | Source: Ambulatory Visit | Attending: Neurological Surgery | Admitting: Neurological Surgery

## 2012-01-03 ENCOUNTER — Ambulatory Visit (HOSPITAL_COMMUNITY)
Admission: RE | Admit: 2012-01-03 | Discharge: 2012-01-03 | Disposition: A | Discharge status: Home / Self Care | Payer: Medicare Other | Source: Ambulatory Visit | Attending: Neurological Surgery | Admitting: Neurological Surgery

## 2012-01-03 HISTORY — DX: Unspecified osteoarthritis, unspecified site: M19.90

## 2012-01-03 HISTORY — DX: Diverticulosis of intestine, part unspecified, without perforation or abscess without bleeding: K57.90

## 2012-01-03 LAB — CBC
HCT: 42.2 % (ref 36.0–46.0)
Platelets: 245 10*3/uL (ref 150–400)
RDW: 13.1 % (ref 11.5–15.5)
WBC: 10.3 10*3/uL (ref 4.0–10.5)

## 2012-01-03 LAB — SURGICAL PCR SCREEN
MRSA, PCR: NEGATIVE
Staphylococcus aureus: NEGATIVE

## 2012-01-03 LAB — BASIC METABOLIC PANEL
Chloride: 103 mEq/L (ref 96–112)
Creatinine, Ser: 0.81 mg/dL (ref 0.50–1.10)
GFR calc Af Amer: 85 mL/min — ABNORMAL LOW (ref >90)

## 2012-01-03 NOTE — Progress Notes (Addendum)
4098  Wednesday.. No orders in at this time.Marland Kitchen...will call office to remind...DA HAVE REQUESTED OV NOTES FROM DR. BRACKBILL'S OFFICE .Marland Kitchen.the patient had stress & echo done "quite some time ago" ...both of those came out normal per the patient.  But will try and get a copy...Marland KitchenDA

## 2012-01-04 ENCOUNTER — Encounter (HOSPITAL_COMMUNITY): Payer: Self-pay | Admitting: Anesthesiology

## 2012-01-04 ENCOUNTER — Encounter (HOSPITAL_COMMUNITY)
Admission: RE | Disposition: A | Discharge status: Home / Self Care | Payer: Self-pay | Source: Ambulatory Visit | Attending: Neurological Surgery

## 2012-01-04 ENCOUNTER — Inpatient Hospital Stay (HOSPITAL_COMMUNITY)
Admission: RE | Admit: 2012-01-04 | Discharge: 2012-01-04 | DRG: 491 | Disposition: A | Discharge status: Home / Self Care | Payer: Medicare Other | Source: Ambulatory Visit | Attending: Neurological Surgery | Admitting: Neurological Surgery

## 2012-01-04 ENCOUNTER — Encounter (HOSPITAL_COMMUNITY): Payer: Self-pay | Admitting: *Deleted

## 2012-01-04 ENCOUNTER — Inpatient Hospital Stay (HOSPITAL_COMMUNITY): Payer: Medicare Other | Admitting: Anesthesiology

## 2012-01-04 ENCOUNTER — Inpatient Hospital Stay (HOSPITAL_COMMUNITY): Payer: Medicare Other

## 2012-01-04 DIAGNOSIS — Z888 Allergy status to other drugs, medicaments and biological substances status: Secondary | ICD-10-CM

## 2012-01-04 DIAGNOSIS — I059 Rheumatic mitral valve disease, unspecified: Secondary | ICD-10-CM | POA: Diagnosis present

## 2012-01-04 DIAGNOSIS — M47817 Spondylosis without myelopathy or radiculopathy, lumbosacral region: Secondary | ICD-10-CM | POA: Diagnosis present

## 2012-01-04 DIAGNOSIS — Z01812 Encounter for preprocedural laboratory examination: Secondary | ICD-10-CM

## 2012-01-04 DIAGNOSIS — E7800 Pure hypercholesterolemia, unspecified: Secondary | ICD-10-CM

## 2012-01-04 DIAGNOSIS — Z881 Allergy status to other antibiotic agents status: Secondary | ICD-10-CM

## 2012-01-04 DIAGNOSIS — N952 Postmenopausal atrophic vaginitis: Secondary | ICD-10-CM

## 2012-01-04 DIAGNOSIS — M5126 Other intervertebral disc displacement, lumbar region: Principal | ICD-10-CM | POA: Diagnosis present

## 2012-01-04 DIAGNOSIS — M5127 Other intervertebral disc displacement, lumbosacral region: Secondary | ICD-10-CM

## 2012-01-04 HISTORY — PX: LUMBAR LAMINECTOMY/DECOMPRESSION MICRODISCECTOMY: SHX5026

## 2012-01-04 SURGERY — LUMBAR LAMINECTOMY/DECOMPRESSION MICRODISCECTOMY 1 LEVEL
Anesthesia: General | Site: Back | Laterality: Left | Wound class: Clean

## 2012-01-04 MED ORDER — MIDAZOLAM HCL 5 MG/5ML IJ SOLN
INTRAMUSCULAR | Status: DC | PRN
  Administered 2012-01-04: 2 mg via INTRAVENOUS

## 2012-01-04 MED ORDER — ATENOLOL 12.5 MG HALF TABLET: 12.5000 mg | ORAL_TABLET | Freq: Every day | ORAL | Status: DC

## 2012-01-04 MED ORDER — PHENYLEPHRINE HCL 10 MG/ML IJ SOLN
INTRAMUSCULAR | Status: DC | PRN
  Administered 2012-01-04 (×3): 80 ug via INTRAVENOUS
  Administered 2012-01-04: 40 ug via INTRAVENOUS
  Administered 2012-01-04: 80 ug via INTRAVENOUS

## 2012-01-04 MED ORDER — MORPHINE SULFATE 2 MG/ML IJ SOLN: 1.0000 mg | INTRAMUSCULAR | Status: DC | PRN

## 2012-01-04 MED ORDER — GLYCOPYRROLATE 0.2 MG/ML IJ SOLN
INTRAMUSCULAR | Status: DC | PRN
  Administered 2012-01-04: .4 mg via INTRAVENOUS

## 2012-01-04 MED ORDER — SODIUM CHLORIDE 0.9 % IV SOLN: 250.0000 mL | INTRAVENOUS | Status: DC

## 2012-01-04 MED ORDER — ONDANSETRON HCL 4 MG/2ML IJ SOLN
INTRAMUSCULAR | Status: DC | PRN
  Administered 2012-01-04: 4 mg via INTRAVENOUS

## 2012-01-04 MED ORDER — ACETAMINOPHEN 10 MG/ML IV SOLN
INTRAVENOUS | Status: AC
  Administered 2012-01-04: 1000 mg via INTRAVENOUS
  Filled 2012-01-04: qty 100

## 2012-01-04 MED ORDER — LIDOCAINE-EPINEPHRINE 1 %-1:100000 IJ SOLN
INTRAMUSCULAR | Status: DC | PRN
  Administered 2012-01-04: 5 mL via INTRADERMAL

## 2012-01-04 MED ORDER — SODIUM CHLORIDE 0.9 % IV SOLN
INTRAVENOUS | Status: AC
  Filled 2012-01-04: qty 500

## 2012-01-04 MED ORDER — HEMOSTATIC AGENTS (NO CHARGE) OPTIME
TOPICAL | Status: DC | PRN
  Administered 2012-01-04: 1 via TOPICAL

## 2012-01-04 MED ORDER — ACETAMINOPHEN 650 MG RE SUPP: 650.0000 mg | RECTAL | Status: DC | PRN

## 2012-01-04 MED ORDER — LACTATED RINGERS IV SOLN
INTRAVENOUS | Status: DC | PRN
  Administered 2012-01-04 (×2): via INTRAVENOUS

## 2012-01-04 MED ORDER — THROMBIN 5000 UNITS EX SOLR
CUTANEOUS | Status: DC | PRN
  Administered 2012-01-04 (×2): 5000 [IU] via TOPICAL

## 2012-01-04 MED ORDER — PROPOFOL 10 MG/ML IV EMUL
INTRAVENOUS | Status: DC | PRN
  Administered 2012-01-04: 160 mg via INTRAVENOUS

## 2012-01-04 MED ORDER — HYDROMORPHONE HCL PF 1 MG/ML IJ SOLN: 0.2500 mg | INTRAMUSCULAR | Status: DC | PRN

## 2012-01-04 MED ORDER — MENTHOL 3 MG MT LOZG
1.0000 | LOZENGE | OROMUCOSAL | Status: DC | PRN
  Filled 2012-01-04: qty 9

## 2012-01-04 MED ORDER — FENTANYL CITRATE 0.05 MG/ML IJ SOLN
INTRAMUSCULAR | Status: DC | PRN
  Administered 2012-01-04: 150 ug via INTRAVENOUS

## 2012-01-04 MED ORDER — BACITRACIN 50000 UNITS IM SOLR
INTRAMUSCULAR | Status: AC
  Filled 2012-01-04: qty 1

## 2012-01-04 MED ORDER — 0.9 % SODIUM CHLORIDE (POUR BTL) OPTIME
TOPICAL | Status: DC | PRN
  Administered 2012-01-04: 1000 mL

## 2012-01-04 MED ORDER — PHENOL 1.4 % MT LIQD: 1.0000 | OROMUCOSAL | Status: DC | PRN

## 2012-01-04 MED ORDER — ONDANSETRON HCL 4 MG/2ML IJ SOLN: 4.0000 mg | INTRAMUSCULAR | Status: DC | PRN

## 2012-01-04 MED ORDER — BUPIVACAINE HCL (PF) 0.5 % IJ SOLN
INTRAMUSCULAR | Status: DC | PRN
  Administered 2012-01-04: 5 mL
  Administered 2012-01-04: 10 mL

## 2012-01-04 MED ORDER — ROCURONIUM BROMIDE 100 MG/10ML IV SOLN
INTRAVENOUS | Status: DC | PRN
  Administered 2012-01-04: 40 mg via INTRAVENOUS

## 2012-01-04 MED ORDER — ACETAMINOPHEN 325 MG PO TABS: 650.0000 mg | ORAL_TABLET | ORAL | Status: DC | PRN

## 2012-01-04 MED ORDER — VANCOMYCIN HCL IN DEXTROSE 1-5 GM/200ML-% IV SOLN
INTRAVENOUS | Status: AC
  Administered 2012-01-04: 1000 mg via INTRAVENOUS
  Filled 2012-01-04: qty 200

## 2012-01-04 MED ORDER — HYDROCODONE-ACETAMINOPHEN 5-325 MG PO TABS
1.0000 | ORAL_TABLET | ORAL | Status: DC | PRN
  Administered 2012-01-04: 1 via ORAL
  Filled 2012-01-04: qty 1

## 2012-01-04 MED ORDER — SODIUM CHLORIDE 0.9 % IR SOLN
Status: DC | PRN
  Administered 2012-01-04: 09:00:00

## 2012-01-04 MED ORDER — ATORVASTATIN CALCIUM 10 MG PO TABS
5.0000 mg | ORAL_TABLET | Freq: Every day | ORAL | Status: DC
  Filled 2012-01-04: qty 0.5

## 2012-01-04 MED ORDER — NEOSTIGMINE METHYLSULFATE 1 MG/ML IJ SOLN
INTRAMUSCULAR | Status: DC | PRN
  Administered 2012-01-04: 3 mg via INTRAVENOUS

## 2012-01-04 MED ORDER — DIAZEPAM 5 MG PO TABS: 5.0000 mg | ORAL_TABLET | Freq: Four times a day (QID) | ORAL | Status: AC | PRN

## 2012-01-04 MED ORDER — SODIUM CHLORIDE 0.9 % IJ SOLN: 3.0000 mL | INTRAMUSCULAR | Status: DC | PRN

## 2012-01-04 MED ORDER — KETOROLAC TROMETHAMINE 15 MG/ML IJ SOLN
15.0000 mg | Freq: Four times a day (QID) | INTRAMUSCULAR | Status: DC
  Administered 2012-01-04: 15 mg via INTRAVENOUS
  Filled 2012-01-04: qty 1

## 2012-01-04 MED ORDER — SODIUM CHLORIDE 0.9 % IJ SOLN
3.0000 mL | Freq: Two times a day (BID) | INTRAMUSCULAR | Status: DC
  Administered 2012-01-04: 3 mL via INTRAVENOUS

## 2012-01-04 MED ORDER — LIDOCAINE HCL (CARDIAC) 20 MG/ML IV SOLN
INTRAVENOUS | Status: DC | PRN
  Administered 2012-01-04: 80 mg via INTRAVENOUS

## 2012-01-04 MED ORDER — DOCUSATE SODIUM 100 MG PO CAPS
100.0000 mg | ORAL_CAPSULE | Freq: Two times a day (BID) | ORAL | Status: DC
  Administered 2012-01-04: 100 mg via ORAL
  Filled 2012-01-04: qty 1

## 2012-01-04 MED ORDER — EPHEDRINE SULFATE 50 MG/ML IJ SOLN
INTRAMUSCULAR | Status: DC | PRN
  Administered 2012-01-04: 10 mg via INTRAVENOUS

## 2012-01-04 MED ORDER — DIAZEPAM 5 MG PO TABS: 5.0000 mg | ORAL_TABLET | Freq: Four times a day (QID) | ORAL | Status: DC | PRN

## 2012-01-04 SURGICAL SUPPLY — 52 items
ADH SKN CLS APL DERMABOND .7 (GAUZE/BANDAGES/DRESSINGS) ×1
BAG DECANTER FOR FLEXI CONT (MISCELLANEOUS) ×2 IMPLANT
BLADE SURG ROTATE 9660 (MISCELLANEOUS) IMPLANT
BUR ACORN 6.0 (BURR) IMPLANT
BUR MATCHSTICK NEURO 3.0 LAGG (BURR) ×2 IMPLANT
CANISTER SUCTION 2500CC (MISCELLANEOUS) ×2 IMPLANT
CLOTH BEACON ORANGE TIMEOUT ST (SAFETY) ×2 IMPLANT
CONT SPEC 4OZ CLIKSEAL STRL BL (MISCELLANEOUS) ×2 IMPLANT
DECANTER SPIKE VIAL GLASS SM (MISCELLANEOUS) ×2 IMPLANT
DERMABOND ADVANCED (GAUZE/BANDAGES/DRESSINGS) ×1
DERMABOND ADVANCED .7 DNX12 (GAUZE/BANDAGES/DRESSINGS) ×1 IMPLANT
DRAPE MICROSCOPE LEICA (MISCELLANEOUS) ×1 IMPLANT
DRAPE PED LAPAROTOMY (DRAPES) ×2 IMPLANT
DRAPE POUCH INSTRU U-SHP 10X18 (DRAPES) ×2 IMPLANT
DRAPE PROXIMA HALF (DRAPES) ×1 IMPLANT
DURAPREP 26ML APPLICATOR (WOUND CARE) ×2 IMPLANT
ELECT REM PT RETURN 9FT ADLT (ELECTROSURGICAL) ×2
ELECTRODE REM PT RTRN 9FT ADLT (ELECTROSURGICAL) ×1 IMPLANT
GAUZE SPONGE 4X4 16PLY XRAY LF (GAUZE/BANDAGES/DRESSINGS) IMPLANT
GLOVE BIOGEL PI IND STRL 8.5 (GLOVE) ×1 IMPLANT
GLOVE BIOGEL PI INDICATOR 8.5 (GLOVE) ×2
GLOVE ECLIPSE 8.5 STRL (GLOVE) ×2 IMPLANT
GLOVE EXAM NITRILE LRG STRL (GLOVE) IMPLANT
GLOVE EXAM NITRILE MD LF STRL (GLOVE) IMPLANT
GLOVE EXAM NITRILE XL STR (GLOVE) IMPLANT
GLOVE EXAM NITRILE XS STR PU (GLOVE) IMPLANT
GLOVE SS BIOGEL STRL SZ 8 (GLOVE) IMPLANT
GLOVE SUPERSENSE BIOGEL SZ 8 (GLOVE) ×1
GLOVE SURG SS PI 8.0 STRL IVOR (GLOVE) ×1 IMPLANT
GOWN BRE IMP SLV AUR LG STRL (GOWN DISPOSABLE) IMPLANT
GOWN BRE IMP SLV AUR XL STRL (GOWN DISPOSABLE) ×1 IMPLANT
GOWN STRL REIN 2XL LVL4 (GOWN DISPOSABLE) ×3 IMPLANT
KIT BASIN OR (CUSTOM PROCEDURE TRAY) ×2 IMPLANT
KIT ROOM TURNOVER OR (KITS) ×2 IMPLANT
NDL SPNL 20GX3.5 QUINCKE YW (NEEDLE) IMPLANT
NEEDLE HYPO 22GX1.5 SAFETY (NEEDLE) ×2 IMPLANT
NEEDLE SPNL 20GX3.5 QUINCKE YW (NEEDLE) ×2 IMPLANT
NS IRRIG 1000ML POUR BTL (IV SOLUTION) ×2 IMPLANT
PACK LAMINECTOMY NEURO (CUSTOM PROCEDURE TRAY) ×2 IMPLANT
PAD ARMBOARD 7.5X6 YLW CONV (MISCELLANEOUS) ×6 IMPLANT
PATTIES SURGICAL .5 X1 (DISPOSABLE) ×2 IMPLANT
RUBBERBAND STERILE (MISCELLANEOUS) ×2 IMPLANT
SPONGE GAUZE 4X4 12PLY (GAUZE/BANDAGES/DRESSINGS) ×2 IMPLANT
SPONGE SURGIFOAM ABS GEL SZ50 (HEMOSTASIS) ×2 IMPLANT
SUT VIC AB 1 CT1 18XBRD ANBCTR (SUTURE) ×1 IMPLANT
SUT VIC AB 1 CT1 8-18 (SUTURE) ×2
SUT VIC AB 2-0 CP2 18 (SUTURE) ×2 IMPLANT
SUT VIC AB 3-0 SH 8-18 (SUTURE) ×2 IMPLANT
SYR 20ML ECCENTRIC (SYRINGE) ×2 IMPLANT
TOWEL OR 17X24 6PK STRL BLUE (TOWEL DISPOSABLE) ×2 IMPLANT
TOWEL OR 17X26 10 PK STRL BLUE (TOWEL DISPOSABLE) ×2 IMPLANT
WATER STERILE IRR 1000ML POUR (IV SOLUTION) ×2 IMPLANT

## 2012-01-04 NOTE — Progress Notes (Signed)
Patient ID: Shannon Brennan, female   DOB: 1946/02/02, 66 y.o.   MRN: [id number] Incision is clean and dry patient feels well. Motor strength appears normal and distal left lower extremity both and gastroc and tibialis anterior. Patient notes some residual discomfort in the buttock and thigh on left side. She has been ambulatory and is voiding. Plan discharge home.

## 2012-01-04 NOTE — Progress Notes (Signed)
Pt given D/C instructions with Rx, verbal understanding. Pt D/C'd home @ 1740 per MD order. Rema Fendt, RN

## 2012-01-04 NOTE — Preoperative (Signed)
Beta Blockers   Reason not to administer Beta Blockers:Not Applicable 

## 2012-01-04 NOTE — Op Note (Signed)
Preoperative diagnosis: L5-S1 herniated nucleus pulposus left with left lumbar radiculopathy, and spondylolisthesis L5 Postoperative diagnosis: L5-S1 herniated nucleus pulposus with left lumbar radiculopathy, and spondylolisthesis L5 Procedure: Record discectomy L5-S1 left with decompression of left L5 and S1 nerve root using operating microscope microdissection technique Surgeon: Barnett Abu M.D. Assistant: Delma Officer M.D. Indications: Patient is a 66 year old individual who has had significant stent severe left ocular leg pain for the past couple of months it started acutely. The patient has not had previous significant back problems. She has evidence of a herniated nucleus pulposus L5-S1 on the left side and she's been advised regarding surgical decompression.  Procedure: The patient was brought to the operating room supine on a stretcher. After the smooth induction of general endotracheal anesthesia patient was carefully turned to the prone position with the bony prominences being appropriately padded and protected. The lower lumbar spine was prepped with alcohol and DuraPrep and then draped in a sterile fashion. The needle was used to localize the area of L5-S1 and a radiograph was obtained demonstrating a needle at the level of L5-S1. Skin was infiltrated with 10 cc of lidocaine 1-100,000 epinephrine mixed 50-50 with half percent Marcaine. An incision was made and carried down to the lumbar dorsal fascia the fascia was opened on the left side of the midline and a subperiosteal dissection was performed in the interlaminar space of L5-S1. The loose laminar arch of L5 was encountered identifying this area positively. A self-retaining retractor was then placed into the wound. The yellow ligament was taken up from the interlaminar space at 5 S1 and the inferior margin of the lamina of L5 was removed and a partial facetectomy was performed at L5-S1. The common dural tube was explored and the take off of  the S1 nerve root was identified and gradually mobilized. A substantial amount of free fragment was encountered at L5 S1 and this was removed. The disc space was noted to be significantly closed. He could not be entered easily. Once the S1 nerve root was freed attention was turned to the lateral recess the path of the L5 nerve root was also found to be free and clear.   After adequate decompression was obtained hemostasis and the soft tissues was verified retractor was removed the operating microscope was removed from the field and the lumbar dorsal fascia was closed with #1 and 2-0 Vicryl in interrupted fashion. 3-0 Vicryl was used to close subcuticular skin. Blood loss was estimated at less than 20 cc.

## 2012-01-04 NOTE — H&P (Signed)
CHIEF COMPLAINT:   Left buttock and left pain which started acutely in April.  HISTORY OF PRESENT ILLNESS:  Shannon Brennan is a 66 year old right-handed individual who noted that rather suddenly in April she started to develop pain in the buttock and left lower extremity.  She was hoping that it would go away but after several weeks it got so severe that she saw Dr. Vicente Males physician assistant and ultimately had some plain x-rays and was told she had a spondylolisthesis.  She continued to have pain radiating down to the foot and ultimately she was told to see an orthopedist but prior to this, she had seen Dr. Stephannie Peters, a chiropractor here in town.  She had treatments on a daily basis for several weeks and though she got some relief with the treatments, relief was not long lasting.  She ultimately saw Dr. Magnus Ivan and an MRI was ordered.  This was performed on 12/13/2011 and is reviewed in the office.  The study demonstrates the patient has a Grade 1 spondylolisthesis at the L5-S1 level and is approximately 3 mm in slip.  She had evidence of a herniated nucleus pulposus on the left side at L5-S1 which elevates and compresses the S1 nerve root.  She has been dealing with the pain and notes that recently she was started on Hydrocodone for the past three weeks and she has been using this in increasing amounts as she notes that the initial pain relief was about six hours in duration and now is only about 3 hours in duration.  In addition, she has been using about 9-12 Ibuprofen a day and notes that this is barely containing the pain.    The patient notes she has not had any weakness in the leg although initially she had been walking with a limp.  She feels now that she is walking somewhat better.  When the pain is more severe, she feels that she may indeed have a limp.    PAST MEDICAL HISTORY:  Her general health has been excellent.  She reports no significant medical issues other than a mitral valve prolapse.  She takes  some Atenolol 12.5/25 mg. q.d.  She is on Atorvastatin 5 mg. q.d., Vagifem, Zyrtec, Melatonin, Shaklee Vitamin Supplements and mineral supplements.  She has been using Hydrocodone and Ibuprofen for pain control and also uses a stool softener to combat some mild constipation.    ALLERGIES:    She notes intolerances of a number of medications including Flagyl, Levaquin, Erythromycin and Wellbutrin.    PREVIOUS SURGERIES:   Tonsillectomy, hysterectomy, breast biopsy and C-section in the past.  SOCIAL HISTORY:    She does not smoke.  She drinks alcohol on rare social occasions.  Height and weight have been stable at 5' 4.5" and 129 pounds.  REVIEW OF SYSTEMS:   Notable for wearing glasses, irregular pulse, heart murmur, high cholesterol, a number of hemangioma of the liver noted, mild change in bowel habits, history of back pain and leg pain and some nasal and inhalant allergies.     PHYSICAL EXAMINATION:  Her physical exam reveals that she stands straight and erect. She can flex forward a maximum of about 30 degrees before she experiences significant pulling in the back and left leg.  Her motor strength appears intact in the iliopsoas, quadriceps, tibialis anterior and gastrocs. On confrontational testing, her DTR's are 2+ in the patellae, trace in the left Achilles, 2+ in the right Achilles.  Straight leg raising is markedly positive at 30  degrees in the seated position.    IMPRESSION:    The patient has evidence of Grade 1 spondylolisthesis.  Today in the office to further her workup, I obtained flexion and extension and lateral lumbar spine x-rays.  This demonstrates that she has approximately 3 mm of anterolisthesis at L5 on S1 but does not appear to change between flexion and extension.    She has evidence of a herniated nucleus pulposus at L5-S1 and the vertebrae that has spondylolisthesis.  I indicated that because of the stability of the process at L5-S1, I suggest that we do a simple diskectomy  to decompress the existing S1 nerve root.  I indicated that this should help alleviate the worst of the radicular pain.  Concerns about a simple microdiskectomy are that it does not stabilize the joint which has some proneness to instability.  However at this point with the patient being 66 years old with congenital spondylolisthesis, I note she has very little to no movement between flexion and extension, I am hopeful that we can do the surgery without disrupting the joint and causing any further instability.  Concern is that over time, she could rerupture the disc.  I indicated that the chance of this occurring is about 15%.  This is regardless of whether the patient has surgery.  I noted that the major risks of the surgery include the potential for infection, the possibility of spinal cord leakage, and the fact that this nerve has to heal itself after surgery.  On rare occasions we find that patient actually do have exacerbation of pain but most times I find that they get fairly prompt relief.  Recovery from surgery takes a total of about 6-8 weeks.  During this time, however, the patient should be ambulatory and able to be up and about.  I am hopeful that she will do well with a simple diskectomy and given the duration of her symptoms and lack of improvement despite the passage of time and conservative efforts, I do not believe that she will respond well to an epidural steroid injection.  I would advise simply decompressing this process at L5-S1 on the left side.  We will plan on schedule her at the earliest convenience.

## 2012-01-04 NOTE — Transfer of Care (Signed)
Immediate Anesthesia Transfer of Care Note  Patient: Shannon Brennan  Procedure(s) Performed: Procedure(s) (LRB) with comments: LUMBAR LAMINECTOMY/DECOMPRESSION MICRODISCECTOMY 1 LEVEL (Left) - Left Lumbar Five-Sacral One Microdiskectomy  Patient Location: PACU  Anesthesia Type: General  Level of Consciousness: awake, alert  and oriented  Airway & Oxygen Therapy: Patient Spontanous Breathing and Patient connected to face mask oxygen  Post-op Assessment: Report given to PACU RN  Post vital signs: Reviewed and stable  Complications: No apparent anesthesia complications

## 2012-01-04 NOTE — Discharge Summary (Signed)
  Admitting diagnosis: Herniated nucleus pulposus L5-S1 left with left lumbar radiculopathy, spondylolysis Discharge and final diagnosis: Herniated nucleus pulposus L5-S1 left with left lumbar radiculopathy, spondylolisis Condition on discharge: Improved Prescriptions: Valium 5 mg #30 no refills Major operation: Microdiscectomy L5-S1 left with operating microscope microdissection technique

## 2012-01-04 NOTE — Anesthesia Postprocedure Evaluation (Signed)
  Anesthesia Post-op Note  Patient: Shannon Brennan  Procedure(s) Performed: Procedure(s) (LRB) with comments: LUMBAR LAMINECTOMY/DECOMPRESSION MICRODISCECTOMY 1 LEVEL (Left) - Left Lumbar Five-Sacral One Microdiskectomy  Patient Location: PACU  Anesthesia Type: General  Level of Consciousness: awake  Airway and Oxygen Therapy: Patient Spontanous Breathing  Post-op Pain: mild  Post-op Assessment: Post-op Vital signs reviewed  Post-op Vital Signs: Reviewed  Complications: No apparent anesthesia complications

## 2012-01-04 NOTE — Anesthesia Preprocedure Evaluation (Addendum)
Anesthesia Evaluation  Patient identified by MRN, date of birth, ID band Patient awake    Reviewed: Allergy & Precautions, H&P , NPO status , Patient's Chart, lab work & pertinent test results, reviewed documented beta blocker date and time   Airway Mallampati: II TM Distance: >3 FB     Dental  (+) Teeth Intact   Pulmonary neg pulmonary ROS,    Pulmonary exam normal       Cardiovascular hypertension, Pt. on medications and Pt. on home beta blockers  History of mitral valve prolapse. Work up negative in the past. CE   Neuro/Psych    GI/Hepatic negative GI ROS, Neg liver ROS,   Endo/Other    Renal/GU negative Renal ROS     Musculoskeletal   Abdominal Normal abdominal exam  (+)   Peds  Hematology negative hematology ROS (+)   Anesthesia Other Findings   Reproductive/Obstetrics                         Anesthesia Physical Anesthesia Plan  ASA: II  Anesthesia Plan: General   Post-op Pain Management:    Induction: Intravenous  Airway Management Planned: Oral ETT  Additional Equipment:   Intra-op Plan:   Post-operative Plan: Extubation in OR  Informed Consent: I have reviewed the patients History and Physical, chart, labs and discussed the procedure including the risks, benefits and alternatives for the proposed anesthesia with the patient or authorized representative who has indicated his/her understanding and acceptance.   Dental advisory given  Plan Discussed with: CRNA, Anesthesiologist and Surgeon  Anesthesia Plan Comments:         Anesthesia Quick Evaluation

## 2012-01-04 NOTE — Progress Notes (Signed)
Vitals done @ 1200 (noon)

## 2012-01-05 ENCOUNTER — Encounter (HOSPITAL_COMMUNITY): Payer: Self-pay | Admitting: Neurological Surgery

## 2012-01-05 NOTE — Telephone Encounter (Signed)
Requested chart again.

## 2012-01-12 NOTE — Telephone Encounter (Signed)
Left message to call back  

## 2012-01-15 NOTE — Telephone Encounter (Signed)
Spoke with patient and she has already had procedure

## 2012-01-23 ENCOUNTER — Encounter: Payer: Self-pay | Admitting: Cardiology

## 2012-01-24 ENCOUNTER — Encounter: Payer: Self-pay | Admitting: Cardiology

## 2012-02-01 ENCOUNTER — Other Ambulatory Visit (INDEPENDENT_AMBULATORY_CARE_PROVIDER_SITE_OTHER): Payer: Medicare Other

## 2012-02-01 ENCOUNTER — Ambulatory Visit (INDEPENDENT_AMBULATORY_CARE_PROVIDER_SITE_OTHER): Payer: Medicare Other | Admitting: Cardiology

## 2012-02-01 ENCOUNTER — Encounter: Payer: Self-pay | Admitting: Cardiology

## 2012-02-01 VITALS — BP 115/66 | HR 63 | Ht 64.0 in | Wt 129.1 lb

## 2012-02-01 DIAGNOSIS — E7800 Pure hypercholesterolemia, unspecified: Secondary | ICD-10-CM

## 2012-02-01 DIAGNOSIS — I059 Rheumatic mitral valve disease, unspecified: Secondary | ICD-10-CM

## 2012-02-01 DIAGNOSIS — I341 Nonrheumatic mitral (valve) prolapse: Secondary | ICD-10-CM

## 2012-02-01 DIAGNOSIS — D1803 Hemangioma of intra-abdominal structures: Secondary | ICD-10-CM

## 2012-02-01 LAB — HEPATIC FUNCTION PANEL
Alkaline Phosphatase: 53 U/L (ref 39–117)
Bilirubin, Direct: 0.1 mg/dL (ref 0.0–0.3)
Total Bilirubin: 1 mg/dL (ref 0.3–1.2)

## 2012-02-01 LAB — BASIC METABOLIC PANEL
BUN: 18 mg/dL (ref 6–23)
CO2: 29 mEq/L (ref 19–32)
Chloride: 106 mEq/L (ref 96–112)
Creatinine, Ser: 0.7 mg/dL (ref 0.4–1.2)

## 2012-02-01 LAB — LIPID PANEL
LDL Cholesterol: 84 mg/dL (ref 0–99)
Total CHOL/HDL Ratio: 3

## 2012-02-01 NOTE — Progress Notes (Signed)
Quick Note:  Please report to patient. The recent labs are stable. Continue same medication and careful diet. ______ 

## 2012-02-01 NOTE — Progress Notes (Signed)
[name] Date of Birth:  Feb 22, 1946 [facility] [address] [place], [place]  [address] [phone]  Fax   [phone]  HPI: This pleasant 66 year old woman is seen for a four-month followup office visit. She has a past history of mitral valve prolapse and a history of hypercholesterolemia. She also has a history of hepatic hemangiomas.  Since we last saw her she had successful surgery for a ruptured disc which was causing painful sciatica.  Dr. Danielle Dess is her neurosurgeon.  After 4 months of severe pain she was able to have her surgery and to walk unaided out of the hospital later that same afternoon.   Current Outpatient Prescriptions  Medication Sig Dispense Refill  . atenolol (TENORMIN) 25 MG tablet Take 12.5-25 mg by mouth daily.      Marland Kitchen atorvastatin (LIPITOR) 10 MG tablet Take 1/2 tablet daily or as directed  90 tablet  3  . CALCIUM PO Take 1-2 tablets by mouth 2 (two) times daily. Bone Up. Takes 1 tablet in am and 2 tablets in pm      . cetirizine (ZYRTEC) 10 MG tablet Take 5 mg by mouth daily.       . Estradiol 10 MCG TABS Place 2 tablets (20 mcg total) vaginally 2 (two) times a week.  24 tablet  3  . Nutritional Supplements (MELATONIN PO) Take 3 mg by mouth at bedtime.       Marland Kitchen OVER THE COUNTER MEDICATION Take 1 tablet by mouth 2 (two) times daily. Takes Nutri-Feron      . OVER THE COUNTER MEDICATION Take 1 tablet by mouth daily. Takes Vita-Lea Gold, Vitamin K formula      . PREBIOTIC PRODUCT PO Take 1 scoop by mouth daily. Mixes with 4 ounces of H2O      . DISCONTD: atorvastatin (LIPITOR) 10 MG tablet Take 5 mg by mouth daily.         Allergies  Allergen Reactions  . Erythromycin Diarrhea  . Flagyl (Metronidazole Hcl) Nausea Only  . Levofloxacin Other (See Comments)    Headache  . Wellbutrin (Bupropion Hcl) Other (See Comments)    Dizziness  . Restoril Palpitations    Patient Active Problem List  Diagnosis  . Mitral valve prolapse  .  Hypercholesterolemia  . Hepatic hemangioma  . Vaginal atrophy  . Sciatica    History  Smoking status  . Never Smoker   Smokeless tobacco  . Never Used    History  Alcohol Use No    Family History  Problem Relation Age of Onset  . Arthritis Father   . Cancer Father   . Arthritis Mother   . Cancer Mother     skin  . Hypertension Mother   . Stroke Mother   . Heart attack Brother   . Hypertension Brother   . Hypertension Brother   . Diabetes Brother   . Coronary artery disease Brother     CABG x1  . Multiple sclerosis Brother   . Osteopenia Mother     DDD  . Ulcers Mother   . Hyperlipidemia Mother     Review of Systems: The patient denies any heat or cold intolerance.  No weight gain or weight loss.  The patient denies headaches or blurry vision.  There is no cough or sputum production.  The patient denies dizziness.  There is no hematuria or hematochezia.  The patient denies any muscle aches or arthritis.  The patient denies any rash.  The patient  denies frequent falling or instability.  There is no history of depression or anxiety.  All other systems were reviewed and are negative.   Physical Exam: Filed Vitals:   02/01/12 0847  BP: 115/66  Pulse: 63   the general appearance reveals a well-developed well-nourished woman in no distress.The head and neck exam reveals pupils equal and reactive.  Extraocular movements are full.  There is no scleral icterus.  The mouth and pharynx are normal.  The neck is supple.  The carotids reveal no bruits.  The jugular venous pressure is normal.  The  thyroid is not enlarged.  There is no lymphadenopathy.  The chest is clear to percussion and auscultation.  There are no rales or rhonchi.  Expansion of the chest is symmetrical.  The precordium is quiet.  The first heart sound is normal.  The second heart sound is physiologically split.  There is a faint systolic murmur along the left sternal edge.  No diastolic murmur  There is no  abnormal lift or heave.  The abdomen is soft and nontender.  The bowel sounds are normal.  The liver and spleen are not enlarged.  There are no abdominal masses.  There are no abdominal bruits.  Extremities reveal good pedal pulses.  There is no phlebitis or edema.  There is no cyanosis or clubbing.  Strength is normal and symmetrical in all extremities.  There is no lateralizing weakness.  There are no sensory deficits.  The skin is warm and dry.  There is no rash.      Assessment / Plan: Continue on same medication.  Lab work today.  Recheck in 4 months for office visit and fasting lab work.

## 2012-02-01 NOTE — Assessment & Plan Note (Signed)
The patient has a history of hypercholesterolemia.  She has a deceased brother died of sudden cardiac death from coronary disease.  The patient is on Lipitor 10 mg daily and is tolerating it without side effects.

## 2012-02-01 NOTE — Assessment & Plan Note (Signed)
The patient has a past history of mitral valve prolapse.  She has not been experiencing any palpitations or chest pain.  She does not have any history of ischemic heart disease.  She has had a remote nuclear stress test which was negative.

## 2012-02-01 NOTE — Assessment & Plan Note (Signed)
The patient is not having any significant right upper quadrant discomfort from her known hepatic hemangioma

## 2012-02-01 NOTE — Patient Instructions (Addendum)
Fasting lab work today   Your physician recommends that you schedule a follow-up appointment in: 4 months    Continue same medication

## 2012-05-07 ENCOUNTER — Telehealth: Payer: Self-pay | Admitting: Cardiology

## 2012-05-07 MED ORDER — ATENOLOL 25 MG PO TABS: 12.5000 mg | ORAL_TABLET | Freq: Every day | ORAL | Status: DC

## 2012-05-07 NOTE — Telephone Encounter (Signed)
Pt needs Atenolol 25 mg 90 day supply called to [facility]

## 2012-05-07 NOTE — Telephone Encounter (Signed)
Refill completed.

## 2012-05-29 ENCOUNTER — Other Ambulatory Visit: Payer: Self-pay | Admitting: Gastroenterology

## 2012-05-29 DIAGNOSIS — K769 Liver disease, unspecified: Secondary | ICD-10-CM

## 2012-06-03 ENCOUNTER — Other Ambulatory Visit (INDEPENDENT_AMBULATORY_CARE_PROVIDER_SITE_OTHER): Payer: Medicare Other

## 2012-06-03 DIAGNOSIS — E7800 Pure hypercholesterolemia, unspecified: Secondary | ICD-10-CM

## 2012-06-03 DIAGNOSIS — I341 Nonrheumatic mitral (valve) prolapse: Secondary | ICD-10-CM

## 2012-06-03 DIAGNOSIS — I059 Rheumatic mitral valve disease, unspecified: Secondary | ICD-10-CM

## 2012-06-03 LAB — BASIC METABOLIC PANEL
BUN: 15 mg/dL (ref 6–23)
CO2: 31 mEq/L (ref 19–32)
Calcium: 9.5 mg/dL (ref 8.4–10.5)
GFR: 73.8 mL/min (ref >60.00)
Glucose, Bld: 100 mg/dL — ABNORMAL HIGH (ref 70–99)

## 2012-06-03 LAB — LIPID PANEL
Total CHOL/HDL Ratio: 3
VLDL: 19.4 mg/dL (ref 0.0–40.0)

## 2012-06-03 LAB — HEPATIC FUNCTION PANEL
ALT: 19 U/L (ref 0–35)
Bilirubin, Direct: 0 mg/dL (ref 0.0–0.3)
Total Bilirubin: 0.7 mg/dL (ref 0.3–1.2)

## 2012-06-03 NOTE — Progress Notes (Signed)
Quick Note:  Please make copy of labs for patient visit. ______ 

## 2012-06-05 ENCOUNTER — Encounter: Payer: Self-pay | Admitting: Cardiology

## 2012-06-05 ENCOUNTER — Ambulatory Visit (INDEPENDENT_AMBULATORY_CARE_PROVIDER_SITE_OTHER): Payer: Medicare Other | Admitting: Cardiology

## 2012-06-05 VITALS — BP 116/70 | HR 60 | Ht 64.5 in | Wt 127.0 lb

## 2012-06-05 DIAGNOSIS — I341 Nonrheumatic mitral (valve) prolapse: Secondary | ICD-10-CM

## 2012-06-05 DIAGNOSIS — I059 Rheumatic mitral valve disease, unspecified: Secondary | ICD-10-CM

## 2012-06-05 DIAGNOSIS — E7800 Pure hypercholesterolemia, unspecified: Secondary | ICD-10-CM

## 2012-06-05 NOTE — Patient Instructions (Addendum)
Your physician recommends that you continue on your current medications as directed. Please refer to the Current Medication list given to you today.  Your physician recommends that you schedule a follow-up appointment in: 4 months with fasting labs (lp/bmet/hfp)  

## 2012-06-05 NOTE — Assessment & Plan Note (Signed)
Blood work is satisfactory on current therapy.  She brought up the pros and cons of statin therapy.  Because of her strong family history of sudden cardiac death I think that she should continue to take her low dose of Lipitor.  She is tolerating it without any symptoms.

## 2012-06-05 NOTE — Assessment & Plan Note (Signed)
The patient has not been having any symptoms referable to her mitral valve prolapse.  No chest pain shortness of breath or palpitations.

## 2012-06-05 NOTE — Progress Notes (Signed)
Shannon Brennan Date of Birth:  May 22, 1945 [facility] [address] [place], [place]  [address] [phone]  Fax   [phone]  HPI: This pleasant 67 year old woman is seen for a four-month followup office visit. She has a past history of mitral valve prolapse and a history of hypercholesterolemia. She also has a history of hepatic hemangiomas. Since we last saw her she had successful surgery for a ruptured disc which was causing painful sciatica. Dr. Danielle Dess is her neurosurgeon. After 4 months of severe pain she was able to have her surgery and to walk unaided out of the hospital later that same afternoon.  Her back continues to do well.  She has not been walking as much however because of the bad weather.   Current Outpatient Prescriptions  Medication Sig Dispense Refill  . atenolol (TENORMIN) 25 MG tablet Take 0.5-1 tablets (12.5-25 mg total) by mouth daily.  90 tablet  3  . atorvastatin (LIPITOR) 10 MG tablet Take 1/2 tablet daily or as directed  90 tablet  3  . CALCIUM PO Take 1-2 tablets by mouth 2 (two) times daily. Bone Up. Takes 1 tablet in am and 2 tablets in pm      . cetirizine (ZYRTEC) 10 MG tablet Take 5 mg by mouth daily.       . Estradiol 10 MCG TABS Place 1 tablet vaginally 2 (two) times a week.      . Nutritional Supplements (MELATONIN PO) Take 3 mg by mouth at bedtime.       Marland Kitchen OVER THE COUNTER MEDICATION Take 1 tablet by mouth 2 (two) times daily. Takes Nutri-Feron      . OVER THE COUNTER MEDICATION Take 1 tablet by mouth daily. Takes Vita-Lea Gold, Vitamin K formula      . PREBIOTIC PRODUCT PO Take 1 scoop by mouth daily. Mixes with 4 ounces of H2O        Allergies  Allergen Reactions  . Erythromycin Diarrhea  . Flagyl (Metronidazole Hcl) Nausea Only  . Levofloxacin Other (See Comments)    Headache  . Wellbutrin (Bupropion Hcl) Other (See Comments)    Dizziness  . Restoril Palpitations    Patient Active Problem List  Diagnosis  . Mitral  valve prolapse  . Hypercholesterolemia  . Hepatic hemangioma  . Vaginal atrophy  . Sciatica    History  Smoking status  . Never Smoker   Smokeless tobacco  . Never Used    History  Alcohol Use No    Family History  Problem Relation Age of Onset  . Arthritis Father   . Cancer Father   . Arthritis Mother   . Cancer Mother     skin  . Hypertension Mother   . Stroke Mother   . Heart attack Brother   . Hypertension Brother   . Hypertension Brother   . Diabetes Brother   . Coronary artery disease Brother     CABG x1  . Multiple sclerosis Brother   . Osteopenia Mother     DDD  . Ulcers Mother   . Hyperlipidemia Mother     Review of Systems: The patient denies any heat or cold intolerance.  No weight gain or weight loss.  The patient denies headaches or blurry vision.  There is no cough or sputum production.  The patient denies dizziness.  There is no hematuria or hematochezia.  The patient denies any muscle aches or arthritis.  The patient denies any rash.  The patient denies  frequent falling or instability.  There is no history of depression or anxiety.  All other systems were reviewed and are negative.   Physical Exam: Filed Vitals:   06/05/12 0932  BP: 116/70  Pulse: 60   the general appearance reveals a well-developed well-nourished woman in no distress.The head and neck exam reveals pupils equal and reactive.  Extraocular movements are full.  There is no scleral icterus.  The mouth and pharynx are normal.  The neck is supple.  The carotids reveal no bruits.  The jugular venous pressure is normal.  The  thyroid is not enlarged.  There is no lymphadenopathy.  The chest is clear to percussion and auscultation.  There are no rales or rhonchi.  Expansion of the chest is symmetrical.  The precordium is quiet.  The first heart sound is normal.  The second heart sound is physiologically split.  There is no murmur gallop rub.  There is a sharp midsystolic click. There is no  abnormal lift or heave.  The abdomen is soft and nontender.  The bowel sounds are normal.  The liver and spleen are not enlarged.  There are no abdominal masses.  There are no abdominal bruits.  Extremities reveal good pedal pulses.  There is no phlebitis or edema.  There is no cyanosis or clubbing.  Strength is normal and symmetrical in all extremities.  There is no lateralizing weakness.  There are no sensory deficits.  The skin is warm and dry.  There is no rash.      Assessment / Plan: Continue same medication.  Recheck in 4 months for followup office visit lipid panel hepatic function panel and basal metabolic panel.  Continue careful diet.  Her weight is down 2 pounds since last visit.

## 2012-06-19 ENCOUNTER — Ambulatory Visit
Admission: RE | Admit: 2012-06-19 | Discharge: 2012-06-19 | Disposition: A | Discharge status: Home / Self Care | Payer: Medicare Other | Source: Ambulatory Visit | Attending: Gastroenterology | Admitting: Gastroenterology

## 2012-06-19 DIAGNOSIS — K769 Liver disease, unspecified: Secondary | ICD-10-CM

## 2012-10-03 ENCOUNTER — Other Ambulatory Visit (INDEPENDENT_AMBULATORY_CARE_PROVIDER_SITE_OTHER): Payer: Medicare Other

## 2012-10-03 DIAGNOSIS — E7800 Pure hypercholesterolemia, unspecified: Secondary | ICD-10-CM

## 2012-10-03 LAB — LIPID PANEL
LDL Cholesterol: 86 mg/dL (ref 0–99)
Total CHOL/HDL Ratio: 3
Triglycerides: 95 mg/dL (ref 0.0–149.0)
VLDL: 19 mg/dL (ref 0.0–40.0)

## 2012-10-03 LAB — HEPATIC FUNCTION PANEL
Albumin: 4.2 g/dL (ref 3.5–5.2)
Alkaline Phosphatase: 50 U/L (ref 39–117)
Total Protein: 6.9 g/dL (ref 6.0–8.3)

## 2012-10-03 LAB — BASIC METABOLIC PANEL
CO2: 27 mEq/L (ref 19–32)
Chloride: 105 mEq/L (ref 96–112)
Creatinine, Ser: 0.8 mg/dL (ref 0.4–1.2)
Potassium: 3.9 mEq/L (ref 3.5–5.1)
Sodium: 140 mEq/L (ref 135–145)

## 2012-10-08 ENCOUNTER — Ambulatory Visit (INDEPENDENT_AMBULATORY_CARE_PROVIDER_SITE_OTHER): Payer: Medicare Other | Admitting: Cardiology

## 2012-10-08 ENCOUNTER — Encounter: Payer: Self-pay | Admitting: Cardiology

## 2012-10-08 VITALS — BP 104/68 | HR 64 | Ht 64.0 in | Wt 127.8 lb

## 2012-10-08 DIAGNOSIS — I341 Nonrheumatic mitral (valve) prolapse: Secondary | ICD-10-CM

## 2012-10-08 DIAGNOSIS — E7800 Pure hypercholesterolemia, unspecified: Secondary | ICD-10-CM

## 2012-10-08 DIAGNOSIS — I059 Rheumatic mitral valve disease, unspecified: Secondary | ICD-10-CM

## 2012-10-08 DIAGNOSIS — I951 Orthostatic hypotension: Secondary | ICD-10-CM

## 2012-10-08 MED ORDER — METOPROLOL SUCCINATE ER 25 MG PO TB24: ORAL_TABLET | ORAL | Status: DC

## 2012-10-08 NOTE — Assessment & Plan Note (Signed)
The patient has low blood pressure in the 80s or 90s at times.  This often occurs shortly after she takes her morning dose of atenolol.  We are going to stop atenolol and switch her to long-acting generic Toprol 25 mg taking one half tablet daily.  If that dose is too strong she will cut it into fourths.  She should also increase dietary salt.

## 2012-10-08 NOTE — Patient Instructions (Signed)
START TOPROL XL 25 MG 1/2 TABLET DAILY   STOP ATENOLOL   Increase dietary salt slightly   Your physician wants you to follow-up in: 4 months with fasting labs (lp/bmet/hfp) and EKG You will receive a reminder letter in the mail two months in advance. If you don't receive a letter, please call our office to schedule the follow-up appointment.

## 2012-10-08 NOTE — Progress Notes (Signed)
Shannon Brennan Date of Birth:  1946/03/29 [facility] [address] [place], [place]  [address] [phone]  Fax   [phone]  HPI: This pleasant 67 year old woman is seen for a four-month followup office visit. She has a past history of mitral valve prolapse and a history of hypercholesterolemia. She also has a history of hepatic hemangiomas. Since we last saw her she had successful surgery for a ruptured disc which was causing painful sciatica. Dr. Danielle Dess is her neurosurgeon. After 4 months of severe pain she was able to have her surgery and to walk unaided out of the hospital later that same afternoon. Her back continues to do well.  Since last visit she has not felt quite as well.  Her blood pressure has been too low at times.  Occasionally she will feel her heart racing into the 80s or 90s.   Current Outpatient Prescriptions  Medication Sig Dispense Refill  . atorvastatin (LIPITOR) 10 MG tablet Take 1/2 tablet daily or as directed  90 tablet  3  . CALCIUM PO Take 1-2 tablets by mouth 2 (two) times daily. Bone Up. Takes 1 tablet in am and 2 tablets in pm      . cetirizine (ZYRTEC) 10 MG tablet Take 5 mg by mouth daily.       . Estradiol 10 MCG TABS Place 1 tablet vaginally 2 (two) times a week.      . Nutritional Supplements (MELATONIN PO) Take 3 mg by mouth at bedtime.       Marland Kitchen OVER THE COUNTER MEDICATION Take 1 tablet by mouth 2 (two) times daily. Takes Nutri-Feron      . OVER THE COUNTER MEDICATION Take 1 tablet by mouth daily. Takes Vita-Lea Gold, Vitamin K formula      . OVER THE COUNTER MEDICATION Doculax 1 tab every night      . PREBIOTIC PRODUCT PO Take 1 scoop by mouth daily. Mixes with 4 ounces of H2O      . metoprolol succinate (TOPROL XL) 25 MG 24 hr tablet 1/2 tablet daily  30 tablet  5   No current facility-administered medications for this visit.    Allergies  Allergen Reactions  . Erythromycin Diarrhea  . Flagyl (Metronidazole Hcl) Nausea  Only  . Levofloxacin Other (See Comments)    Headache  . Wellbutrin (Bupropion Hcl) Other (See Comments)    Dizziness  . Restoril Palpitations    Patient Active Problem List   Diagnosis Date Noted  . Sciatica 10/06/2011  . Mitral valve prolapse 10/14/2010  . Hypercholesterolemia 10/14/2010  . Hepatic hemangioma 10/14/2010  . Vaginal atrophy 06/07/1998    Class: History of    History  Smoking status  . Never Smoker   Smokeless tobacco  . Never Used    History  Alcohol Use No    Family History  Problem Relation Age of Onset  . Arthritis Father   . Cancer Father   . Arthritis Mother   . Cancer Mother     skin  . Hypertension Mother   . Stroke Mother   . Heart attack Brother   . Hypertension Brother   . Hypertension Brother   . Diabetes Brother   . Coronary artery disease Brother     CABG x1  . Multiple sclerosis Brother   . Osteopenia Mother     DDD  . Ulcers Mother   . Hyperlipidemia Mother     Review of Systems: The patient denies any heat or cold  intolerance.  No weight gain or weight loss.  The patient denies headaches or blurry vision.  There is no cough or sputum production.  The patient denies dizziness.  There is no hematuria or hematochezia.  The patient denies any muscle aches or arthritis.  The patient denies any rash.  The patient denies frequent falling or instability.  There is no history of depression or anxiety.  All other systems were reviewed and are negative.   Physical Exam: Filed Vitals:   10/08/12 0914  BP: 104/68  Pulse: 64   general appearance feels a well-developed well-nourished woman in no distress.The head and neck exam reveals pupils equal and reactive.  Extraocular movements are full.  There is no scleral icterus.  The mouth and pharynx are normal.  The neck is supple.  The carotids reveal no bruits.  The jugular venous pressure is normal.  The  thyroid is not enlarged.  There is no lymphadenopathy.  The chest is clear to  percussion and auscultation.  There are no rales or rhonchi.  Expansion of the chest is symmetrical.  The precordium is quiet.  The first heart sound is normal.  The second heart sound is physiologically split.  There is no murmur gallop rub and there is isolated midsystolic click.  There is no abnormal lift or heave.  The abdomen is soft and nontender.  The bowel sounds are normal.  The liver and spleen are not enlarged.  There are no abdominal masses.  There are no abdominal bruits.  Extremities reveal good pedal pulses.  There is no phlebitis or edema.  There is no cyanosis or clubbing.  Strength is normal and symmetrical in all extremities.  There is no lateralizing weakness.  There are no sensory deficits.  The skin is warm and dry.  There is no rash.     Assessment / Plan: Switch to Toprol 12.5 mg daily.  Increase dietary salt. Recheck in 4 months for followup office visit EKG lipid panel hepatic function panel and basal metabolic panel

## 2012-10-08 NOTE — Assessment & Plan Note (Signed)
The patient has mitral valve prolapse.  She has been on a very small dose of atenolol 12.5 mg tablet daily.  At times this dose appears to be too low.

## 2012-10-08 NOTE — Assessment & Plan Note (Signed)
The patient has a history of hypercholesterolemia.  She is on low-dose atorvastatin 5 mg daily.  Blood work is satisfactory and she is not having a myalgias.  Liver function studies remain normal

## 2012-10-18 ENCOUNTER — Other Ambulatory Visit: Payer: Self-pay | Admitting: *Deleted

## 2012-10-18 ENCOUNTER — Telehealth: Payer: Self-pay | Admitting: Cardiology

## 2012-10-18 MED ORDER — METOPROLOL SUCCINATE ER 25 MG PO TB24: 25.0000 mg | ORAL_TABLET | Freq: Every day | ORAL | Status: DC

## 2012-10-18 NOTE — Telephone Encounter (Signed)
New problem   Pt is not adjusting very well to metropolol extended release 25 mg 1/2 tablet 1x daily- pt states her heart seems to beat faster all the time-more like pounding when she walks up stairs

## 2012-10-18 NOTE — Telephone Encounter (Signed)
Pt states that since stopping atenolol she feels her heart racing more often and her bp has been elevated.  Per Norma Fredrickson, pt advised to take a whole 25mg  metoprolol at bedtime daily.  Pt agreed to plan.

## 2012-10-23 ENCOUNTER — Telehealth: Payer: Self-pay | Admitting: Cardiology

## 2012-10-23 NOTE — Telephone Encounter (Signed)
Takes the Metoprolol 25 mg at night and heart rate goes down into the 70's but when she is up moving around it goes into the 90's. Patient states she is just not feeling as well on this dose. Patient blood pressure has gone up to 120's-130's systolic. Discussed with Lawson Fiscal NP and will have her go back on her Atenolol 25 mg  1/2 tablet daily and discuss further with  Dr. Patty Sermons next week. Patient will continue to monitor her blood pressure

## 2012-10-23 NOTE — Telephone Encounter (Signed)
Follow-up:    Patient is calling to follow-up on her call from 10/18/12.  Patient states that she still does not feel good despite the suggested medication change and would like to know how to proceed. Please call back.

## 2012-10-27 NOTE — Telephone Encounter (Signed)
Agree with advice given.  We will see how she is doing back on the atenolol.

## 2012-10-28 NOTE — Telephone Encounter (Signed)
Left message to call back  

## 2012-10-28 NOTE — Telephone Encounter (Signed)
Spoke with patient and she is feeling much better, lowest blood pressure systolic once was 103 all other times in the 120's and heart rate back to 60's-70's. Patient will continue to monitor and call is systolic less than 100

## 2012-12-09 ENCOUNTER — Encounter: Payer: Self-pay | Admitting: Cardiology

## 2013-02-04 ENCOUNTER — Ambulatory Visit: Payer: Medicare Other | Admitting: Cardiology

## 2013-02-05 ENCOUNTER — Encounter: Payer: Self-pay | Admitting: Cardiology

## 2013-02-05 ENCOUNTER — Ambulatory Visit (INDEPENDENT_AMBULATORY_CARE_PROVIDER_SITE_OTHER): Payer: Medicare Other | Admitting: Cardiology

## 2013-02-05 VITALS — BP 128/82 | HR 64 | Ht 64.0 in | Wt 121.8 lb

## 2013-02-05 DIAGNOSIS — I341 Nonrheumatic mitral (valve) prolapse: Secondary | ICD-10-CM

## 2013-02-05 DIAGNOSIS — E7800 Pure hypercholesterolemia, unspecified: Secondary | ICD-10-CM

## 2013-02-05 DIAGNOSIS — I059 Rheumatic mitral valve disease, unspecified: Secondary | ICD-10-CM

## 2013-02-05 DIAGNOSIS — G4700 Insomnia, unspecified: Secondary | ICD-10-CM

## 2013-02-05 LAB — BASIC METABOLIC PANEL
BUN: 17 mg/dL (ref 6–23)
CO2: 28 mEq/L (ref 19–32)
Chloride: 102 mEq/L (ref 96–112)
Creatinine, Ser: 0.8 mg/dL (ref 0.4–1.2)
GFR: 81.64 mL/min (ref >60.00)
Glucose, Bld: 90 mg/dL (ref 70–99)
Sodium: 140 mEq/L (ref 135–145)

## 2013-02-05 LAB — HEPATIC FUNCTION PANEL
ALT: 23 U/L (ref 0–35)
AST: 18 U/L (ref 0–37)
Albumin: 4.7 g/dL (ref 3.5–5.2)
Alkaline Phosphatase: 49 U/L (ref 39–117)
Bilirubin, Direct: 0.1 mg/dL (ref 0.0–0.3)
Total Bilirubin: 1.6 mg/dL — ABNORMAL HIGH (ref 0.3–1.2)
Total Protein: 7.4 g/dL (ref 6.0–8.3)

## 2013-02-05 LAB — LIPID PANEL
Cholesterol: 205 mg/dL — ABNORMAL HIGH (ref 0–200)
HDL: 62.9 mg/dL
Total CHOL/HDL Ratio: 3
Triglycerides: 78 mg/dL (ref 0.0–149.0)
VLDL: 15.6 mg/dL (ref 0.0–40.0)

## 2013-02-05 MED ORDER — SERTRALINE HCL 25 MG PO TABS: 25.0000 mg | ORAL_TABLET | Freq: Every day | ORAL | Status: DC

## 2013-02-05 NOTE — Assessment & Plan Note (Signed)
The patient has a history of mitral valve prolapse.  She has not been having any recent palpitations or chest pain.  She remains on atenolol 25 mg daily

## 2013-02-05 NOTE — Patient Instructions (Signed)
Will obtain labs today and call you with the results (lp/bmet/hfp)  RX FOR ZOLOFT 25 MG AT BEDTIME HAS BEEN SENT TO [facility]  Your physician wants you to follow-up in: 4 months with fasting labs (lp/bmet/hfp)  You will receive a reminder letter in the mail two months in advance. If you don't receive a letter, please call our office to schedule the follow-up appointment.

## 2013-02-05 NOTE — Progress Notes (Signed)
Shannon Brennan Date of Birth:  [birth year] [facility] [address] [place], [place]  [address] [phone]  Fax   [phone]  HPI: This pleasant 67 year old woman is seen for a four-month followup office visit. She has a past history of mitral valve prolapse and a history of hypercholesterolemia. She also has a history of hepatic hemangiomas.  Previously she has had successful surgery for a ruptured disc which was causing painful sciatica. Dr. Danielle Dess is her neurosurgeon. After 4 months of severe pain she was able to have her surgery and to walk unaided out of the hospital later that same afternoon. Her back continues to do well.  Since last visit she has not been doing well.  She has been under a lot more stress regarding family problems.  She is not sleeping well at night.  Current Outpatient Prescriptions  Medication Sig Dispense Refill  . atenolol (TENORMIN) 25 MG tablet Take 25 mg by mouth daily.      Marland Kitchen CALCIUM PO Take 1-2 tablets by mouth 2 (two) times daily. Bone Up. Takes 1 tablet in am and 2 tablets in pm      . cetirizine (ZYRTEC) 10 MG tablet Take 5 mg by mouth as needed.       . Estradiol 10 MCG TABS Place 1 tablet vaginally 2 (two) times a week.      . NON FORMULARY ISAGENIX      . Nutritional Supplements (MELATONIN PO) Take 3 mg by mouth at bedtime.       Marland Kitchen PREBIOTIC PRODUCT PO Take 1 scoop by mouth daily. Mixes with 4 ounces of H2O      . sertraline (ZOLOFT) 25 MG tablet Take 1 tablet (25 mg total) by mouth at bedtime.  30 tablet  5   No current facility-administered medications for this visit.    Allergies  Allergen Reactions  . Erythromycin Diarrhea  . Flagyl [Metronidazole Hcl] Nausea Only  . Levofloxacin Other (See Comments)    Headache  . Wellbutrin [Bupropion Hcl] Other (See Comments)    Dizziness  . Restoril Palpitations    Patient Active Problem List   Diagnosis Date Noted  . Orthostatic hypotension 10/08/2012  . Sciatica  10/06/2011  . Mitral valve prolapse 10/14/2010  . Hypercholesterolemia 10/14/2010  . Hepatic hemangioma 10/14/2010  . Vaginal atrophy 06/07/1998    Class: History of    History  Smoking status  . Never Smoker   Smokeless tobacco  . Never Used    History  Alcohol Use No    Family History  Problem Relation Age of Onset  . Arthritis Father   . Cancer Father   . Arthritis Mother   . Cancer Mother     skin  . Hypertension Mother   . Stroke Mother   . Heart attack Brother   . Hypertension Brother   . Hypertension Brother   . Diabetes Brother   . Coronary artery disease Brother     CABG x1  . Multiple sclerosis Brother   . Osteopenia Mother     DDD  . Ulcers Mother   . Hyperlipidemia Mother     Review of Systems: The patient denies any heat or cold intolerance.  No weight gain or weight loss.  The patient denies headaches or blurry vision.  There is no cough or sputum production.  The patient denies dizziness.  There is no hematuria or hematochezia.  The patient denies any muscle aches or arthritis.  The patient denies  any rash.  The patient denies frequent falling or instability.  There is no history of depression or anxiety.  All other systems were reviewed and are negative.   Physical Exam: Filed Vitals:   02/05/13 1417  BP: 128/82  Pulse: 64   general appearance feels a well-developed well-nourished woman in no distress.The head and neck exam reveals pupils equal and reactive.  Extraocular movements are full.  There is no scleral icterus.  The mouth and pharynx are normal.  The neck is supple.  The carotids reveal no bruits.  The jugular venous pressure is normal.  The  thyroid is not enlarged.  There is no lymphadenopathy.  The chest is clear to percussion and auscultation.  There are no rales or rhonchi.  Expansion of the chest is symmetrical.  The precordium is quiet.  The first heart sound is normal.  The second heart sound is physiologically split.  There is no  murmur gallop rub and there is isolated midsystolic click.  There is no abnormal lift or heave.  The abdomen is soft and nontender.  The bowel sounds are normal.  The liver and spleen are not enlarged.  There are no abdominal masses.  There are no abdominal bruits.  Extremities reveal good pedal pulses.  There is no phlebitis or edema.  There is no cyanosis or clubbing.  Strength is normal and symmetrical in all extremities.  There is no lateralizing weakness.  There are no sensory deficits.  The skin is warm and dry.  There is no rash.  EKG today shows normal sinus rhythm and is within normal limits   Assessment / Plan: Continue same medication.  Trial of Zoloft generic 25 mg at bedtime #30 for insomnia. Lab work today pending. Recheck in 4 months for office visit lipid panel hepatic function panel and basal metabolic panel

## 2013-02-05 NOTE — Assessment & Plan Note (Signed)
Patient has a history of hypercholesterolemia.  She has been concerned about potential statins side effects and so has been off her Lipitor for the past 7 weeks.  We are checking blood work today.  She states she would be willing to go back on a statin if her lipids are bad.  She has lost significant weight since last visit which may help her lipid panel status.

## 2013-02-05 NOTE — Assessment & Plan Note (Signed)
The patient is having difficulty with insomnia.  In the past she has tried various hypnotics which have not helped her.  We talked about alternative such as Ativan or Xanax which she did not wish to try nor did she want to try Ambien.  We will try very low dose Zoloft 25 mg at bedtime and see if this may help.  If it does not help we could also consider alternatives such as trazodone.

## 2013-02-09 NOTE — Progress Notes (Signed)
Quick Note:  Please report to patient. The recent labs are stable. Continue same medication and careful diet.Cholesterol 205 higher but HDL is better so she does not have to go back on statins yet. LFTs okay. ______

## 2013-02-11 ENCOUNTER — Telehealth: Payer: Self-pay | Admitting: *Deleted

## 2013-02-11 NOTE — Telephone Encounter (Signed)
Message copied by Burnell Blanks on Tue Feb 11, 2013 10:42 AM ------      Message from: Cassell Clement      Created: Sun Feb 09, 2013 10:11 PM       Please report to patient.  The recent labs are stable. Continue same medication and careful diet.Cholesterol 205 higher but HDL is better so she does not have to go back on statins yet. LFTs okay. ------

## 2013-02-11 NOTE — Telephone Encounter (Signed)
Advised patient of lab results  

## 2013-02-13 ENCOUNTER — Telehealth: Payer: Self-pay | Admitting: *Deleted

## 2013-02-13 DIAGNOSIS — G4700 Insomnia, unspecified: Secondary | ICD-10-CM

## 2013-02-13 MED ORDER — TRAZODONE HCL 50 MG PO TABS: ORAL_TABLET | ORAL | Status: DC

## 2013-02-13 NOTE — Telephone Encounter (Signed)
Spoke with patient and she did not tolerate zoloft. Discussed with  Dr. Patty Sermons and will have her try Trazodone 25 mg at bedtime as needed. Rx sent to pharmacy, advised patient.

## 2013-04-29 ENCOUNTER — Telehealth: Payer: Self-pay | Admitting: *Deleted

## 2013-04-29 NOTE — Telephone Encounter (Signed)
Message copied by Carmela Hurt on Tue Apr 29, 2013  8:23 AM ------      Message from: Regis Bill B      Created: Wed Apr 23, 2013 12:34 PM      Regarding: RE: lipitor       Meant to respond sooner, sorry. Last labs stated she did not need to go back on statins yet      Shannon Brennan       ----- Message -----         From: Carmela Hurt, RN         Sent: 04/17/2013   3:01 PM           To: Burnell Blanks      Subject: lipitor                                                  isnt on med list but [facility] request refill?       ------

## 2013-06-06 ENCOUNTER — Other Ambulatory Visit (INDEPENDENT_AMBULATORY_CARE_PROVIDER_SITE_OTHER): Payer: Medicare Other

## 2013-06-06 DIAGNOSIS — E7800 Pure hypercholesterolemia, unspecified: Secondary | ICD-10-CM

## 2013-06-09 LAB — LIPID PANEL
CHOL/HDL RATIO: 2
CHOLESTEROL: 166 mg/dL (ref 0–200)
HDL: 69 mg/dL (ref >39.00)
LDL Cholesterol: 82 mg/dL (ref 0–99)
TRIGLYCERIDES: 77 mg/dL (ref 0.0–149.0)
VLDL: 15.4 mg/dL (ref 0.0–40.0)

## 2013-06-09 LAB — BASIC METABOLIC PANEL
BUN: 16 mg/dL (ref 6–23)
CO2: 29 mEq/L (ref 19–32)
CREATININE: 0.7 mg/dL (ref 0.4–1.2)
Calcium: 9.5 mg/dL (ref 8.4–10.5)
Chloride: 105 mEq/L (ref 96–112)
GFR: 86.88 mL/min (ref >60.00)
Glucose, Bld: 91 mg/dL (ref 70–99)
Potassium: 4.3 mEq/L (ref 3.5–5.1)
SODIUM: 141 meq/L (ref 135–145)

## 2013-06-09 LAB — HEPATIC FUNCTION PANEL
ALBUMIN: 4.4 g/dL (ref 3.5–5.2)
ALT: 27 U/L (ref 0–35)
AST: 22 U/L (ref 0–37)
Alkaline Phosphatase: 50 U/L (ref 39–117)
Bilirubin, Direct: 0.1 mg/dL (ref 0.0–0.3)
Total Bilirubin: 1 mg/dL (ref 0.3–1.2)
Total Protein: 7 g/dL (ref 6.0–8.3)

## 2013-06-09 NOTE — Progress Notes (Signed)
Quick Note:  Please make copy of labs for patient visit. ______ 

## 2013-06-11 ENCOUNTER — Encounter (INDEPENDENT_AMBULATORY_CARE_PROVIDER_SITE_OTHER): Payer: Self-pay

## 2013-06-11 ENCOUNTER — Ambulatory Visit (INDEPENDENT_AMBULATORY_CARE_PROVIDER_SITE_OTHER): Payer: Medicare Other | Admitting: Cardiology

## 2013-06-11 ENCOUNTER — Encounter: Payer: Self-pay | Admitting: Cardiology

## 2013-06-11 VITALS — BP 108/76 | HR 64 | Ht 64.0 in | Wt 122.0 lb

## 2013-06-11 DIAGNOSIS — E7800 Pure hypercholesterolemia, unspecified: Secondary | ICD-10-CM

## 2013-06-11 DIAGNOSIS — I341 Nonrheumatic mitral (valve) prolapse: Secondary | ICD-10-CM

## 2013-06-11 DIAGNOSIS — I059 Rheumatic mitral valve disease, unspecified: Secondary | ICD-10-CM

## 2013-06-11 DIAGNOSIS — R609 Edema, unspecified: Secondary | ICD-10-CM

## 2013-06-11 DIAGNOSIS — I951 Orthostatic hypotension: Secondary | ICD-10-CM

## 2013-06-11 MED ORDER — FUROSEMIDE 20 MG PO TABS: 20.0000 mg | ORAL_TABLET | Freq: Every day | ORAL | Status: DC | PRN

## 2013-06-11 NOTE — Progress Notes (Signed)
Shannon Brennan Date of Birth:  Feb 20, 1946 [address] [place], [place]  [address] [phone]  Fax   [phone]  HPI: This pleasant 68 year old woman is seen for a four-month followup office visit. She has a past history of mitral valve prolapse and a history of hypercholesterolemia. She also has a history of hepatic hemangiomas.  Previously she has had successful surgery for a ruptured disc which was causing painful sciatica. Dr. Ellene Route is her neurosurgeon. After 4 months of severe pain she was able to have her surgery and to walk unaided out of the hospital later that same afternoon. Her back continues to do well.  Since last visit she has not been doing well.  She has been under a lot more stress regarding family problems.  She and her husband has now moved in with her mother. She previously had a problem with not sleeping well but this has improved with trazodone 50 mg at bedtime.  Current Outpatient Prescriptions  Medication Sig Dispense Refill  . atenolol (TENORMIN) 25 MG tablet Take 25 mg by mouth daily. Take 1/2 tab      . atorvastatin (LIPITOR) 10 MG tablet Take 10 mg by mouth daily. 1/2 tab at bedtime      . CALCIUM PO Take 1-2 tablets by mouth 2 (two) times daily. Bone Up. Takes 1 tablet in am and 2 tablets in pm      . cetirizine (ZYRTEC) 10 MG tablet Take 5 mg by mouth as needed.       . Estradiol 10 MCG TABS Place 1 tablet vaginally 2 (two) times a week.      . NON FORMULARY ISAGENIX      . Nutritional Supplements (MELATONIN PO) Take 3 mg by mouth at bedtime.       Marland Kitchen PREBIOTIC PRODUCT PO Take 1 scoop by mouth daily. Mixes with 4 ounces of H2O      . traZODone (DESYREL) 50 MG tablet 1 tablet at bedtime as needed      . furosemide (LASIX) 20 MG tablet Take 1 tablet (20 mg total) by mouth daily as needed (for swelling).  30 tablet  3   No current facility-administered medications for this visit.    Allergies  Allergen Reactions  . Erythromycin Diarrhea  .  Flagyl [Metronidazole Hcl] Nausea Only  . Levofloxacin Other (See Comments)    Headache  . Wellbutrin [Bupropion Hcl] Other (See Comments)    Dizziness  . Zoloft [Sertraline Hcl]     [employer]   . Restoril Palpitations    Patient Active Problem List   Diagnosis Date Noted  . Peripheral edema 06/11/2013  . Insomnia 02/05/2013  . Orthostatic hypotension 10/08/2012  . Sciatica 10/06/2011  . Mitral valve prolapse 10/14/2010  . Hypercholesterolemia 10/14/2010  . Hepatic hemangioma 10/14/2010  . Vaginal atrophy 06/07/1998    Class: History of    History  Smoking status  . Never Smoker   Smokeless tobacco  . Never Used    History  Alcohol Use No    Family History  Problem Relation Age of Onset  . Arthritis Father   . Cancer Father   . Arthritis Mother   . Cancer Mother     skin  . Hypertension Mother   . Stroke Mother   . Heart attack Brother   . Hypertension Brother   . Hypertension Brother   . Diabetes Brother   . Coronary artery disease Brother     CABG x1  .  Multiple sclerosis Brother   . Osteopenia Mother     DDD  . Ulcers Mother   . Hyperlipidemia Mother     Review of Systems: The patient denies any heat or cold intolerance.  No weight gain or weight loss.  The patient denies headaches or blurry vision.  There is no cough or sputum production.  The patient denies dizziness.  There is no hematuria or hematochezia.  The patient denies any muscle aches or arthritis.  The patient denies any rash.  The patient denies frequent falling or instability.  There is no history of depression or anxiety.  All other systems were reviewed and are negative.   Physical Exam: Filed Vitals:   06/11/13 0819  BP: 108/76  Pulse: 64   general appearance feels a well-developed well-nourished woman in no distress.The head and neck exam reveals pupils equal and reactive.  Extraocular movements are full.  There is no scleral icterus.  The mouth and pharynx are normal.  The neck is  supple.  The carotids reveal no bruits.  The jugular venous pressure is normal.  The  thyroid is not enlarged.  There is no lymphadenopathy.  The chest is clear to percussion and auscultation.  There are no rales or rhonchi.  Expansion of the chest is symmetrical.  The precordium is quiet.  The first heart sound is normal.  The second heart sound is physiologically split.  There is no murmur gallop rub and there is isolated midsystolic click.  There is no abnormal lift or heave.  The abdomen is soft and nontender.  The bowel sounds are normal.  The liver and spleen are not enlarged.  There are no abdominal masses.  There are no abdominal bruits.  Extremities reveal good pedal pulses.  There is no phlebitis or edema noted today.  There is no cyanosis or clubbing.  Strength is normal and symmetrical in all extremities.  There is no lateralizing weakness.  There are no sensory deficits.  The skin is warm and dry.  There is no rash.     Assessment / Plan: Continue same medication.   Recheck in 4 months for office visit lipid panel hepatic function panel and basal metabolic panel Trial of furosemide 20 mg tablets initially taking just half a tablet at bedtime as needed for occasional peripheral edema.

## 2013-06-11 NOTE — Assessment & Plan Note (Signed)
The patient is back on a small dose of Lipitor 5 mg at bedtime and her lipids are satisfactory today

## 2013-06-11 NOTE — Assessment & Plan Note (Signed)
No chest pain or shortness of breath.  No recent palpitations.  She has been so busy moving that she has not had time to do her regular walking exercise.  She does get exercise walking up and down the stairs at her mother's home

## 2013-06-11 NOTE — Assessment & Plan Note (Signed)
The patient has not had any recent problems with orthostatic hypotension.  She has had some problems with peripheral edema at the end of the day.  We will give her a small dose of Lasix to use occasionally.  We've prescribed Lasix 20 mg tablets and she will start with just a half tablet in order to avoid orthostatic hypotension

## 2013-06-13 ENCOUNTER — Other Ambulatory Visit: Payer: Self-pay

## 2013-06-13 MED ORDER — ATORVASTATIN CALCIUM 10 MG PO TABS: ORAL_TABLET | ORAL | Status: DC

## 2013-10-09 ENCOUNTER — Ambulatory Visit (INDEPENDENT_AMBULATORY_CARE_PROVIDER_SITE_OTHER): Payer: Medicare Other | Admitting: Cardiology

## 2013-10-09 ENCOUNTER — Encounter: Payer: Self-pay | Admitting: Cardiology

## 2013-10-09 VITALS — BP 116/64 | HR 59 | Ht 64.0 in | Wt 124.0 lb

## 2013-10-09 DIAGNOSIS — I341 Nonrheumatic mitral (valve) prolapse: Secondary | ICD-10-CM

## 2013-10-09 DIAGNOSIS — I951 Orthostatic hypotension: Secondary | ICD-10-CM

## 2013-10-09 DIAGNOSIS — E7800 Pure hypercholesterolemia, unspecified: Secondary | ICD-10-CM

## 2013-10-09 DIAGNOSIS — G4700 Insomnia, unspecified: Secondary | ICD-10-CM

## 2013-10-09 DIAGNOSIS — I059 Rheumatic mitral valve disease, unspecified: Secondary | ICD-10-CM

## 2013-10-09 NOTE — Progress Notes (Signed)
Shannon Brennan Date of Birth:  [birth year] [facility] [address] [place], [place]  [address] [phone]  Fax   [phone]  HPI: This pleasant 68 year old woman is seen for a four-month followup office visit. She has a past history of mitral valve prolapse and a history of hypercholesterolemia. She also has a history of hepatic hemangiomas. Previously she has had successful surgery for a ruptured disc which was causing painful sciatica. Dr. Ellene Route is her neurosurgeon. After 4 months of severe pain she was able to have her surgery and to walk unaided out of the hospital later that same afternoon. Her back continues to do well. Since last visit she has not been doing well. She has been under a lot more stress regarding family problems. She and her husband has now moved in with her mother. She previously had a problem with not sleeping well but this has improved with trazodone 50 mg at bedtime.    Current Outpatient Prescriptions  Medication Sig Dispense Refill  . atenolol (TENORMIN) 25 MG tablet Take 25 mg by mouth daily. Take 1/2 tab      . atorvastatin (LIPITOR) 10 MG tablet 1/2 tab at bedtime  90 tablet  3  . CALCIUM PO Take 1-2 tablets by mouth 2 (two) times daily. Bone Up. Takes 1 tablet in am and 2 tablets in pm      . cetirizine (ZYRTEC) 10 MG tablet Take 5 mg by mouth as needed.       . Estradiol 10 MCG TABS Place 1 tablet vaginally 2 (two) times a week.      . furosemide (LASIX) 20 MG tablet Take 1 tablet (20 mg total) by mouth daily as needed (for swelling).  30 tablet  3  . NON FORMULARY ISAGENIX      . Nutritional Supplements (MELATONIN PO) Take 3 mg by mouth at bedtime.       Marland Kitchen PREBIOTIC PRODUCT PO Take 1 scoop by mouth daily. Mixes with 4 ounces of H2O      . traZODone (DESYREL) 50 MG tablet 1 tablet at bedtime as needed       No current facility-administered medications for this visit.    Allergies  Allergen Reactions  . Erythromycin Diarrhea    . Flagyl [Metronidazole Hcl] Nausea Only  . Levofloxacin Other (See Comments)    Headache  . Wellbutrin [Bupropion Hcl] Other (See Comments)    Dizziness  . Zoloft [Sertraline Hcl]     [employer]   . Restoril Palpitations    Patient Active Problem List   Diagnosis Date Noted  . Peripheral edema 06/11/2013  . Insomnia 02/05/2013  . Orthostatic hypotension 10/08/2012  . Sciatica 10/06/2011  . Mitral valve prolapse 10/14/2010  . Hypercholesterolemia 10/14/2010  . Hepatic hemangioma 10/14/2010  . Vaginal atrophy 06/07/1998    Class: History of    History  Smoking status  . Never Smoker   Smokeless tobacco  . Never Used    History  Alcohol Use No    Family History  Problem Relation Age of Onset  . Arthritis Father   . Cancer Father   . Arthritis Mother   . Cancer Mother     skin  . Hypertension Mother   . Stroke Mother   . Heart attack Brother   . Hypertension Brother   . Hypertension Brother   . Diabetes Brother   . Coronary artery disease Brother     CABG x1  . Multiple  sclerosis Brother   . Osteopenia Mother     DDD  . Ulcers Mother   . Hyperlipidemia Mother     Review of Systems: The patient denies any heat or cold intolerance.  No weight gain or weight loss.  The patient denies headaches or blurry vision.  There is no cough or sputum production.  The patient denies dizziness.  There is no hematuria or hematochezia.  The patient denies any muscle aches or arthritis.  The patient denies any rash.  The patient denies frequent falling or instability.  There is no history of depression or anxiety.  All other systems were reviewed and are negative.   Physical Exam: Filed Vitals:   10/09/13 0901  BP: 116/64  Pulse: 59   the general appearance reveals a well-developed well-nourished woman in no distress.The head and neck exam reveals pupils equal and reactive.  Extraocular movements are full.  There is no scleral icterus.  The mouth and pharynx are normal.   The neck is supple.  The carotids reveal no bruits.  The jugular venous pressure is normal.  The  thyroid is not enlarged.  There is no lymphadenopathy.  The chest is clear to percussion and auscultation.  There are no rales or rhonchi.  Expansion of the chest is symmetrical.  The precordium is quiet.  The first heart sound is normal.  The second heart sound is physiologically split.  There is a short midsystolic click.  There is no abnormal lift or heave.  The abdomen is soft and nontender.  The bowel sounds are normal.  The liver and spleen are not enlarged.  There are no abdominal masses.  There are no abdominal bruits.  Extremities reveal good pedal pulses.  There is no phlebitis or edema.  There is no cyanosis or clubbing.  Strength is normal and symmetrical in all extremities.  There is no lateralizing weakness.  There are no sensory deficits.  The skin is warm and dry.  There is no rash.     Assessment / Plan:  1.  Mitral valve prolapse 2. Hypercholesterolemia 3. orthostatic hypotension, improved 4. history of hepatic hemangiomas, stable  Plan: Continue same medication.  Recheck in 4 months for office visit lipid panel hepatic function panel and basal metabolic panel.

## 2013-10-09 NOTE — Assessment & Plan Note (Signed)
She has not been having any recent symptoms of orthostatic hypotension

## 2013-10-09 NOTE — Patient Instructions (Signed)

## 2013-10-09 NOTE — Assessment & Plan Note (Signed)
The patient has had no problems with her mitral valve prolapse.  No palpitations.  No chest pain or shortness of breath.  She has not been getting as much regular walking exercise.

## 2013-10-09 NOTE — Assessment & Plan Note (Signed)
She has a history of hypercholesterolemia.  We are checking lab work today.

## 2013-10-10 ENCOUNTER — Other Ambulatory Visit (INDEPENDENT_AMBULATORY_CARE_PROVIDER_SITE_OTHER): Payer: Medicare Other

## 2013-10-10 DIAGNOSIS — E7800 Pure hypercholesterolemia, unspecified: Secondary | ICD-10-CM

## 2013-10-10 LAB — LIPID PANEL
CHOL/HDL RATIO: 2
Cholesterol: 159 mg/dL (ref 0–200)
HDL: 67.7 mg/dL (ref >39.00)
LDL Cholesterol: 77 mg/dL (ref 0–99)
NONHDL: 91.3
Triglycerides: 70 mg/dL (ref 0.0–149.0)
VLDL: 14 mg/dL (ref 0.0–40.0)

## 2013-10-10 LAB — BASIC METABOLIC PANEL
BUN: 19 mg/dL (ref 6–23)
CHLORIDE: 106 meq/L (ref 96–112)
CO2: 29 mEq/L (ref 19–32)
Calcium: 9.4 mg/dL (ref 8.4–10.5)
Creatinine, Ser: 0.8 mg/dL (ref 0.4–1.2)
GFR: 75.63 mL/min (ref >60.00)
GLUCOSE: 96 mg/dL (ref 70–99)
POTASSIUM: 4 meq/L (ref 3.5–5.1)
Sodium: 141 mEq/L (ref 135–145)

## 2013-10-10 LAB — HEPATIC FUNCTION PANEL
ALT: 29 U/L (ref 0–35)
AST: 23 U/L (ref 0–37)
Albumin: 4.2 g/dL (ref 3.5–5.2)
Alkaline Phosphatase: 46 U/L (ref 39–117)
BILIRUBIN DIRECT: 0.1 mg/dL (ref 0.0–0.3)
BILIRUBIN TOTAL: 1 mg/dL (ref 0.2–1.2)
Total Protein: 6.9 g/dL (ref 6.0–8.3)

## 2013-10-10 NOTE — Progress Notes (Signed)
Quick Note:  Please report to patient. The recent labs are stable. Continue same medication and careful diet. ______ 

## 2013-12-04 ENCOUNTER — Other Ambulatory Visit: Payer: Self-pay | Admitting: *Deleted

## 2013-12-04 MED ORDER — ATENOLOL 25 MG PO TABS: ORAL_TABLET | ORAL | Status: DC

## 2014-02-05 ENCOUNTER — Other Ambulatory Visit (INDEPENDENT_AMBULATORY_CARE_PROVIDER_SITE_OTHER): Payer: Medicare Other | Admitting: *Deleted

## 2014-02-05 DIAGNOSIS — E7800 Pure hypercholesterolemia, unspecified: Secondary | ICD-10-CM

## 2014-02-05 LAB — BASIC METABOLIC PANEL
BUN: 17 mg/dL (ref 6–23)
CALCIUM: 9.3 mg/dL (ref 8.4–10.5)
CO2: 32 meq/L (ref 19–32)
Chloride: 103 mEq/L (ref 96–112)
Creatinine, Ser: 0.8 mg/dL (ref 0.4–1.2)
GFR: 72.42 mL/min (ref >60.00)
Glucose, Bld: 95 mg/dL (ref 70–99)
Potassium: 4 mEq/L (ref 3.5–5.1)
SODIUM: 139 meq/L (ref 135–145)

## 2014-02-05 LAB — HEPATIC FUNCTION PANEL
ALT: 23 U/L (ref 0–35)
AST: 19 U/L (ref 0–37)
Albumin: 3.9 g/dL (ref 3.5–5.2)
Alkaline Phosphatase: 46 U/L (ref 39–117)
Bilirubin, Direct: 0.1 mg/dL (ref 0.0–0.3)
Total Bilirubin: 1 mg/dL (ref 0.2–1.2)
Total Protein: 7.1 g/dL (ref 6.0–8.3)

## 2014-02-05 LAB — LIPID PANEL
Cholesterol: 158 mg/dL (ref 0–200)
HDL: 60.6 mg/dL (ref >39.00)
LDL Cholesterol: 83 mg/dL (ref 0–99)
NONHDL: 97.4
Total CHOL/HDL Ratio: 3
Triglycerides: 72 mg/dL (ref 0.0–149.0)
VLDL: 14.4 mg/dL (ref 0.0–40.0)

## 2014-02-05 NOTE — Progress Notes (Signed)
Quick Note:  Please make copy of labs for patient visit. ______ 

## 2014-02-10 ENCOUNTER — Encounter: Payer: Self-pay | Admitting: Cardiology

## 2014-02-10 ENCOUNTER — Ambulatory Visit (INDEPENDENT_AMBULATORY_CARE_PROVIDER_SITE_OTHER): Payer: Medicare Other | Admitting: Cardiology

## 2014-02-10 VITALS — BP 132/68 | HR 60 | Ht 64.0 in | Wt 126.0 lb

## 2014-02-10 DIAGNOSIS — I951 Orthostatic hypotension: Secondary | ICD-10-CM

## 2014-02-10 DIAGNOSIS — I341 Nonrheumatic mitral (valve) prolapse: Secondary | ICD-10-CM

## 2014-02-10 DIAGNOSIS — E7800 Pure hypercholesterolemia, unspecified: Secondary | ICD-10-CM

## 2014-02-10 DIAGNOSIS — G4700 Insomnia, unspecified: Secondary | ICD-10-CM

## 2014-02-10 NOTE — Progress Notes (Signed)
Shannon Brennan Date of Birth:  June 28, 1945 [facility] [address] [place], [place]  [address] [phone]  Fax   [phone]  HPI: This pleasant 68 year old woman is seen for a four-month followup office visit. She has a past history of mitral valve prolapse and a history of hypercholesterolemia. She also has a history of hepatic hemangiomas. Previously she has had successful surgery for a ruptured disc which was causing painful sciatica. Dr. Ellene Brennan is her neurosurgeon. After 4 months of severe pain she was able to have her surgery and to walk unaided out of the hospital later that same afternoon. Her back continues to do well. Since last visit she has not been doing well. She has been under a lot more stress regarding family problems. She and her husband has now moved in with her mother. She previously had a problem with not sleeping well but this has improved with trazodone 50 mg at bedtime.  She also uses a melatonin spray   Current Outpatient Prescriptions  Medication Sig Dispense Refill  . atenolol (TENORMIN) 25 MG tablet Take one tablet daily or as directed  90 tablet  0  . atorvastatin (LIPITOR) 10 MG tablet 1/2 tab at bedtime  90 tablet  3  . CALCIUM PO Take 1-2 tablets by mouth 2 (two) times daily. Bone Up. Takes 1 tablet in am and 2 tablets in pm      . cetirizine (ZYRTEC) 10 MG tablet Take 5 mg by mouth as needed.       . Estradiol 10 MCG TABS Place 1 tablet vaginally 2 (two) times a week.      . furosemide (LASIX) 20 MG tablet Take 1 tablet (20 mg total) by mouth daily as needed (for swelling).  30 tablet  3  . NON FORMULARY ISAGENIX      . Nutritional Supplements (MELATONIN PO) Take 3 mg by mouth at bedtime.       Marland Kitchen PREBIOTIC PRODUCT PO Take 1 scoop by mouth daily. Mixes with 4 ounces of H2O      . traZODone (DESYREL) 50 MG tablet 1 tablet at bedtime as needed       No current facility-administered medications for this visit.    Allergies    Allergen Reactions  . Erythromycin Diarrhea  . Flagyl [Metronidazole Hcl] Nausea Only  . Levofloxacin Other (See Comments)    Headache  . Wellbutrin [Bupropion Hcl] Other (See Comments)    Dizziness  . Zoloft [Sertraline Hcl]     [employer]   . Restoril Palpitations    Patient Active Problem List   Diagnosis Date Noted  . Peripheral edema 06/11/2013  . Insomnia 02/05/2013  . Orthostatic hypotension 10/08/2012  . Sciatica 10/06/2011  . Mitral valve prolapse 10/14/2010  . Hypercholesterolemia 10/14/2010  . Hepatic hemangioma 10/14/2010  . Vaginal atrophy 06/07/1998    Class: History of    History  Smoking status  . Never Smoker   Smokeless tobacco  . Never Used    History  Alcohol Use No    Family History  Problem Relation Age of Onset  . Arthritis Father   . Cancer Father   . Arthritis Mother   . Cancer Mother     skin  . Hypertension Mother   . Stroke Mother   . Heart attack Brother   . Hypertension Brother   . Hypertension Brother   . Diabetes Brother   . Coronary artery disease Brother  CABG x1  . Multiple sclerosis Brother   . Osteopenia Mother     DDD  . Ulcers Mother   . Hyperlipidemia Mother     Review of Systems: The patient denies any heat or cold intolerance.  No weight gain or weight loss.  The patient denies headaches or blurry vision.  There is no cough or sputum production.  The patient denies dizziness.  There is no hematuria or hematochezia.  The patient denies any muscle aches or arthritis.  The patient denies any rash.  The patient denies frequent falling or instability.  There is no history of depression or anxiety.  All other systems were reviewed and are negative.   Physical Exam: Filed Vitals:   02/10/14 1506  BP: 132/68  Pulse: 60   the general appearance reveals a well-developed well-nourished woman in no distress.The head and neck exam reveals pupils equal and reactive.  Extraocular movements are full.  There is no scleral  icterus.  The mouth and pharynx are normal.  The neck is supple.  The carotids reveal no bruits.  The jugular venous pressure is normal.  The  thyroid is not enlarged.  There is no lymphadenopathy.  The chest is clear to percussion and auscultation.  There are no rales or rhonchi.  Expansion of the chest is symmetrical.  The precordium is quiet.  The first heart sound is normal.  The second heart sound is physiologically split.  There is a short midsystolic click.  There is no abnormal lift or heave.  The abdomen is soft and nontender.  The bowel sounds are normal.  The liver and spleen are not enlarged.  There are no abdominal masses.  There are no abdominal bruits.  Extremities reveal good pedal pulses.  There is no phlebitis or edema.  There is no cyanosis or clubbing.  Strength is normal and symmetrical in all extremities.  There is no lateralizing weakness.  There are no sensory deficits.  The skin is warm and dry.  There is no rash.  EKG shows normal sinus rhythm with occasional PAC  Assessment / Plan:  1.  Mitral valve prolapse 2. Hypercholesterolemia 3. orthostatic hypotension, improved 4. history of hepatic hemangiomas, stable 5. premature atrial contraction  Plan: Continue same medication.  Recheck in 4 months for office visit lipid panel hepatic function panel and basal metabolic panel.

## 2014-02-10 NOTE — Assessment & Plan Note (Signed)
She has not been having any symptoms of orthostatic hypotension

## 2014-02-10 NOTE — Patient Instructions (Signed)
Your physician recommends that you continue on your current medications as directed. Please refer to the Current Medication list given to you today.  Your physician wants you to follow-up in: 4 months with fasting labs (lp/bmet/hfp) You will receive a reminder letter in the mail two months in advance. If you don't receive a letter, please call our office to schedule the follow-up appointment.  

## 2014-02-10 NOTE — Assessment & Plan Note (Signed)
The patient continues to tolerate low-dose atorvastatin for hypercholesterolemia.  No myalgias.

## 2014-02-10 NOTE — Assessment & Plan Note (Signed)
She has occasional fleeting chest pain.  Nothing to suggest angina pectoris.  She does have mitral valve prolapse.

## 2014-03-02 ENCOUNTER — Encounter: Payer: Self-pay | Admitting: Cardiology

## 2014-04-15 ENCOUNTER — Encounter: Payer: Self-pay | Admitting: Cardiology

## 2014-04-27 ENCOUNTER — Telehealth: Payer: Self-pay | Admitting: Cardiology

## 2014-04-27 NOTE — Telephone Encounter (Signed)
Records received on 12.23.15 from [facility] for an appointment with Dr. Mare Ferrari on 2.11.16 at 9:45 am: Given to the CPT on 12.28.15:djc

## 2014-06-05 ENCOUNTER — Other Ambulatory Visit (INDEPENDENT_AMBULATORY_CARE_PROVIDER_SITE_OTHER): Payer: Medicare Other | Admitting: *Deleted

## 2014-06-05 DIAGNOSIS — E7800 Pure hypercholesterolemia, unspecified: Secondary | ICD-10-CM

## 2014-06-05 LAB — HEPATIC FUNCTION PANEL
ALT: 18 U/L (ref 0–35)
AST: 18 U/L (ref 0–37)
Albumin: 4.3 g/dL (ref 3.5–5.2)
Alkaline Phosphatase: 50 U/L (ref 39–117)
Bilirubin, Direct: 0.2 mg/dL (ref 0.0–0.3)
TOTAL PROTEIN: 6.8 g/dL (ref 6.0–8.3)
Total Bilirubin: 0.8 mg/dL (ref 0.2–1.2)

## 2014-06-05 LAB — LIPID PANEL
Cholesterol: 145 mg/dL (ref 0–200)
HDL: 64.1 mg/dL (ref >39.00)
LDL CALC: 65 mg/dL (ref 0–99)
NONHDL: 80.9
Total CHOL/HDL Ratio: 2
Triglycerides: 79 mg/dL (ref 0.0–149.0)
VLDL: 15.8 mg/dL (ref 0.0–40.0)

## 2014-06-05 LAB — BASIC METABOLIC PANEL
BUN: 20 mg/dL (ref 6–23)
CALCIUM: 9.5 mg/dL (ref 8.4–10.5)
CO2: 30 mEq/L (ref 19–32)
Chloride: 105 mEq/L (ref 96–112)
Creatinine, Ser: 0.85 mg/dL (ref 0.40–1.20)
GFR: 70.39 mL/min (ref >60.00)
Glucose, Bld: 98 mg/dL (ref 70–99)
Potassium: 3.8 mEq/L (ref 3.5–5.1)
Sodium: 140 mEq/L (ref 135–145)

## 2014-06-05 NOTE — Progress Notes (Signed)
Quick Note:  Please make copy of labs for patient visit. ______ 

## 2014-06-08 ENCOUNTER — Encounter: Payer: Self-pay | Admitting: Cardiology

## 2014-06-08 ENCOUNTER — Ambulatory Visit (INDEPENDENT_AMBULATORY_CARE_PROVIDER_SITE_OTHER): Payer: Medicare Other | Admitting: Cardiology

## 2014-06-08 VITALS — BP 122/70 | HR 70 | Ht 64.0 in | Wt 127.8 lb

## 2014-06-08 DIAGNOSIS — I341 Nonrheumatic mitral (valve) prolapse: Secondary | ICD-10-CM

## 2014-06-08 DIAGNOSIS — E7800 Pure hypercholesterolemia, unspecified: Secondary | ICD-10-CM

## 2014-06-08 DIAGNOSIS — R609 Edema, unspecified: Secondary | ICD-10-CM

## 2014-06-08 DIAGNOSIS — G4700 Insomnia, unspecified: Secondary | ICD-10-CM

## 2014-06-08 NOTE — Progress Notes (Signed)
Cardiology Office Note   Date:  06/08/2014   ID:  Shannon, Brennan 02/15/1946, MRN [id number]  PCP:  Tivis Ringer, MD  Cardiologist:   Darlin Coco, MD   No chief complaint on file.     History of Present Illness: Shannon Brennan is a 69 y.o. female who presents for follow-up office visit. This pleasant 69 year old woman is seen for a four-month followup office visit. She has a past history of mitral valve prolapse and a history of hypercholesterolemia. She also has a history of hepatic hemangiomas. Previously she has had successful surgery for a ruptured disc which was causing painful sciatica. Dr. Ellene Route is her neurosurgeon. After 4 months of severe pain she was able to have her surgery and to walk unaided out of the hospital later that same afternoon. Her back continues to do well.  She is sleeping better now with a combination of trazodone 75 mg and melatonin. She has not been having any chest pain.  Her PVCs and palpitations have improved. We reviewed her lab work today which is excellent.  Past Medical History  Diagnosis Date  . Hyperlipidemia     tolerates low dose Lipitor  . Mitral valve prolapse     last echo in 2005 showing mild MVP with normal LV function  . Hepatic hemangioma     history of -- followe by Dr. Cristina Gong  . Palpitations   . Family history of cardiovascular disease   . Normal nuclear stress test 2006  . Hx of osteopenia   . Allergic rhinitis   . Insomnia   . Pyuria   . Vaginal atrophy   . UTI (urinary tract infection)   . Diverticulosis   . Arthritis     mild in right thumb  . Hypertension     controlled with atenolol    Past Surgical History  Procedure Laterality Date  . Partial hysterectomy    . Tonsillectomy    . Abdominal hysterectomy    . Lumbar laminectomy/decompression microdiscectomy  01/04/2012    Procedure: LUMBAR LAMINECTOMY/DECOMPRESSION MICRODISCECTOMY 1 LEVEL;  Surgeon: Kristeen Miss, MD;  Location: [facility];   Service: Neurosurgery;  Laterality: Left;  Left Lumbar Five-Sacral One Microdiskectomy     Current Outpatient Prescriptions  Medication Sig Dispense Refill  . atenolol (TENORMIN) 25 MG tablet Take one tablet daily or as directed 90 tablet 0  . atorvastatin (LIPITOR) 10 MG tablet 1/2 tab at bedtime 90 tablet 3  . CALCIUM PO Take 1-2 tablets by mouth 2 (two) times daily. Bone Up. Takes 1 tablet in am and 2 tablets in pm    . cetirizine (ZYRTEC) 10 MG tablet Take 5 mg by mouth as needed.     . Estradiol 10 MCG TABS Place 1 tablet vaginally 2 (two) times a week.    . furosemide (LASIX) 20 MG tablet Take 1 tablet (20 mg total) by mouth daily as needed (for swelling). 30 tablet 3  . NON FORMULARY ISAGENIX    . Nutritional Supplements (MELATONIN PO) Take 3 mg by mouth at bedtime.     Marland Kitchen PREBIOTIC PRODUCT PO Take 1 scoop by mouth daily. Mixes with 4 ounces of H2O    . traZODone (DESYREL) 50 MG tablet 75 mg. 1 tablet at bedtime as needed     No current facility-administered medications for this visit.    Allergies:   Erythromycin; Flagyl; Levofloxacin; Wellbutrin; Zoloft; and Restoril    Social History:  The patient  reports that she  has never smoked. She has never used smokeless tobacco. She reports that she does not drink alcohol or use illicit drugs.   Family History:  The patient's family history includes Arthritis in her father and mother; Cancer in her father and mother; Coronary artery disease in her brother; Diabetes in her brother; Heart attack in her brother; Hyperlipidemia in her mother; Hypertension in her brother, brother, and mother; Multiple sclerosis in her brother; Osteopenia in her mother; Stroke in her mother; Ulcers in her mother.    ROS:  Please see the history of present illness.   Otherwise, review of systems are positive for none.   All other systems are reviewed and negative.    PHYSICAL EXAM: VS:  BP 122/70 mmHg  Pulse 70  Ht 5\' 4"  (1.626 m)  Wt 127 lb 12.8 oz  (57.97 kg)  BMI 21.93 kg/m2 , BMI Body mass index is 21.93 kg/(m^2). GEN: Well nourished, well developed, in no acute distress HEENT: normal Neck: no JVD, carotid bruits, or masses Cardiac: RRR; no murmurs, rubs, or gallops,no edema  Respiratory:  clear to auscultation bilaterally, normal work of breathing GI: soft, nontender, nondistended, + BS MS: no deformity or atrophy Skin: warm and dry, no rash Neuro:  Strength and sensation are intact Psych: euthymic mood, full affect   EKG:  EKG is not ordered today.    Recent Labs: 06/05/2014: ALT 18; BUN 20; Creatinine 0.85; Potassium 3.8; Sodium 140    Lipid Panel    Component Value Date/Time   CHOL 145 06/05/2014 0922   TRIG 79.0 06/05/2014 0922   HDL 64.10 06/05/2014 0922   CHOLHDL 2 06/05/2014 0922   VLDL 15.8 06/05/2014 0922   LDLCALC 65 06/05/2014 0922   LDLDIRECT 133.0 02/05/2013 1512      Wt Readings from Last 3 Encounters:  06/08/14 127 lb 12.8 oz (57.97 kg)  02/10/14 126 lb (57.153 kg)  10/09/13 124 lb (56.246 kg)      ASSESSMENT AND PLAN:  1. Mitral valve prolapse 2. Hypercholesterolemia 3. orthostatic hypotension, improved 4. history of hepatic hemangiomas, stable 5. premature atrial contraction, less frequent, and occurring mainly when she is overtired 6.  Situational stress and insomnia, improved on trazodone and melatonin   Current medicines are reviewed at length with the patient today.  The patient does not have concerns regarding medicines.  The following changes have been made:  no change  Labs/ tests ordered today include: None   Orders Placed This Encounter  Procedures  . Lipid panel  . Hepatic function panel  . Basic metabolic panel     Disposition:   FU with Dr. Mare Ferrari in 4 months for office visit and fasting lab work.   Signed, Darlin Coco, MD  06/08/2014 5:10 PM    [facility] [place], [place], [place]  [address] Phone: [phone]; Fax:  [phone]

## 2014-06-08 NOTE — Patient Instructions (Addendum)
Your physician recommends that you continue on your current medications as directed. Please refer to the Current Medication list given to you today.  Your physician recommends that you schedule a follow-up appointment in: 4 months with fasting labs (lp/bmet/hfp)  

## 2014-06-10 ENCOUNTER — Other Ambulatory Visit: Payer: Self-pay

## 2014-06-10 MED ORDER — ATENOLOL 25 MG PO TABS: ORAL_TABLET | ORAL | Status: DC

## 2014-06-11 ENCOUNTER — Ambulatory Visit: Payer: Medicare Other | Admitting: Cardiology

## 2014-06-12 ENCOUNTER — Ambulatory Visit: Payer: Self-pay | Admitting: Cardiology

## 2014-09-30 ENCOUNTER — Other Ambulatory Visit (INDEPENDENT_AMBULATORY_CARE_PROVIDER_SITE_OTHER): Payer: Medicare Other | Admitting: *Deleted

## 2014-09-30 DIAGNOSIS — E7800 Pure hypercholesterolemia, unspecified: Secondary | ICD-10-CM

## 2014-09-30 LAB — BASIC METABOLIC PANEL
BUN: 17 mg/dL (ref 6–23)
CHLORIDE: 104 meq/L (ref 96–112)
CO2: 29 meq/L (ref 19–32)
Calcium: 9.7 mg/dL (ref 8.4–10.5)
Creatinine, Ser: 0.79 mg/dL (ref 0.40–1.20)
GFR: 76.52 mL/min (ref >60.00)
GLUCOSE: 103 mg/dL — AB (ref 70–99)
Potassium: 4.5 mEq/L (ref 3.5–5.1)
SODIUM: 138 meq/L (ref 135–145)

## 2014-09-30 LAB — HEPATIC FUNCTION PANEL
ALBUMIN: 4.6 g/dL (ref 3.5–5.2)
ALT: 17 U/L (ref 0–35)
AST: 16 U/L (ref 0–37)
Alkaline Phosphatase: 48 U/L (ref 39–117)
BILIRUBIN DIRECT: 0.1 mg/dL (ref 0.0–0.3)
Total Bilirubin: 0.8 mg/dL (ref 0.2–1.2)
Total Protein: 7.2 g/dL (ref 6.0–8.3)

## 2014-09-30 LAB — LIPID PANEL
CHOL/HDL RATIO: 3
Cholesterol: 165 mg/dL (ref 0–200)
HDL: 63.5 mg/dL (ref >39.00)
LDL CALC: 81 mg/dL (ref 0–99)
NonHDL: 101.5
Triglycerides: 103 mg/dL (ref 0.0–149.0)
VLDL: 20.6 mg/dL (ref 0.0–40.0)

## 2014-09-30 NOTE — Addendum Note (Signed)
Addended by: Leanord Asal T on: 09/30/2014 10:32 AM   Modules accepted: Orders

## 2014-10-01 NOTE — Progress Notes (Signed)
Quick Note:  Please make copy of labs for patient visit. ______ 

## 2014-10-07 ENCOUNTER — Ambulatory Visit (INDEPENDENT_AMBULATORY_CARE_PROVIDER_SITE_OTHER): Payer: Medicare Other | Admitting: Cardiology

## 2014-10-07 ENCOUNTER — Encounter: Payer: Self-pay | Admitting: Cardiology

## 2014-10-07 VITALS — BP 120/60 | HR 58 | Ht 64.0 in | Wt 126.4 lb

## 2014-10-07 DIAGNOSIS — I951 Orthostatic hypotension: Secondary | ICD-10-CM

## 2014-10-07 DIAGNOSIS — I341 Nonrheumatic mitral (valve) prolapse: Secondary | ICD-10-CM

## 2014-10-07 DIAGNOSIS — E7800 Pure hypercholesterolemia, unspecified: Secondary | ICD-10-CM

## 2014-10-07 NOTE — Progress Notes (Signed)
Cardiology Office Note   Date:  10/07/2014   ID:  Shannon, Brennan Oct 28, 1945, MRN [id number]  PCP:  Tivis Ringer, MD  Cardiologist: Darlin Coco MD  No chief complaint on file.     History of Present Illness: Shannon Brennan is a 69 y.o. female who presents for scheduled four-month follow-up visit.  This pleasant 69 year old woman is seen for a four-month followup office visit. She has a past history of mitral valve prolapse and a history of hypercholesterolemia. She also has a history of hepatic hemangiomas. Previously she has had successful surgery for a ruptured disc which was causing painful sciatica. Dr. Ellene Route is her neurosurgeon. After 4 months of severe pain she was able to have her surgery and to walk unaided out of the hospital later that same afternoon. Her back continues to do well.  She is still having difficulty falling asleep despite using melatonin and trazodone.  She does not wish to try any Ambien or other medication at this time however.  She just has some many things in her life is so busy that it is hard for her to settle down at night.  He is the primary caregiver for her elderly mother.  Right now she is also babysitting for her grandchildren ages 44 and 29 while the parents are away on vacation. Her blood pressure has been low most of the time and she has had to skip taking her atenolol frequently. She has had some muscle aches and has left off her Lipitor and plans to leave it off for about a month.  She has also had problems with varicose veins in her legs.  Past Medical History  Diagnosis Date  . Hyperlipidemia     tolerates low dose Lipitor  . Mitral valve prolapse     last echo in 2005 showing mild MVP with normal LV function  . Hepatic hemangioma     history of -- followe by Dr. Cristina Gong  . Palpitations   . Family history of cardiovascular disease   . Normal nuclear stress test 2006  . Hx of osteopenia   . Allergic rhinitis   . Insomnia     . Pyuria   . Vaginal atrophy   . UTI (urinary tract infection)   . Diverticulosis   . Arthritis     mild in right thumb  . Hypertension     controlled with atenolol    Past Surgical History  Procedure Laterality Date  . Partial hysterectomy    . Tonsillectomy    . Abdominal hysterectomy    . Lumbar laminectomy/decompression microdiscectomy  01/04/2012    Procedure: LUMBAR LAMINECTOMY/DECOMPRESSION MICRODISCECTOMY 1 LEVEL;  Surgeon: Kristeen Miss, MD;  Location: [facility];  Service: Neurosurgery;  Laterality: Left;  Left Lumbar Five-Sacral One Microdiskectomy     Current Outpatient Prescriptions  Medication Sig Dispense Refill  . atenolol (TENORMIN) 25 MG tablet Take 25 mg by mouth as directed. 1/2 TABLET BY MOUTH AS NEEDED FOR PALPITATIONS    . atorvastatin (LIPITOR) 10 MG tablet 1/2 tab at bedtime (Patient not taking: Reported on 10/07/2014) 90 tablet 3  . CALCIUM PO Take 1-2 tablets by mouth 2 (two) times daily. Bone Up. Takes 1 tablet in am and 2 tablets in pm    . Estradiol 10 MCG TABS Place 1 tablet vaginally 2 (two) times a week.    . furosemide (LASIX) 20 MG tablet Take 1 tablet (20 mg total) by mouth daily as needed (for swelling).  30 tablet 3  . NON FORMULARY ISAGENIX    . Nutritional Supplements (MELATONIN PO) Take 3 mg by mouth at bedtime.     Marland Kitchen PREBIOTIC PRODUCT PO Take 1 scoop by mouth daily. Mixes with 4 ounces of H2O    . traZODone (DESYREL) 50 MG tablet Take 50 mg by mouth daily. 1 tablet at bedtime as needed     No current facility-administered medications for this visit.    Allergies:   Erythromycin; Flagyl; Levofloxacin; Wellbutrin; Zoloft; and Restoril    Social History:  The patient  reports that she has never smoked. She has never used smokeless tobacco. She reports that she does not drink alcohol or use illicit drugs.   Family History:  The patient's family history includes Arthritis in her father and mother; Cancer in her father and mother; Coronary  artery disease in her brother; Diabetes in her brother; Heart attack in her brother; Hyperlipidemia in her mother; Hypertension in her brother, brother, and mother; Multiple sclerosis in her brother; Osteopenia in her mother; Stroke in her mother; Ulcers in her mother.    ROS:  Please see the history of present illness.   Otherwise, review of systems are positive for none.   All other systems are reviewed and negative.    PHYSICAL EXAM: VS:  BP 120/60 mmHg  Pulse 58  Ht 5\' 4"  (1.626 m)  Wt 126 lb 6.4 oz (57.335 kg)  BMI 21.69 kg/m2 , BMI Body mass index is 21.69 kg/(m^2). GEN: Well nourished, well developed, in no acute distress HEENT: normal Neck: no JVD, carotid bruits, or masses Cardiac: RRR; no murmurs, rubs, or gallops,no edema .  There is a prominent midsystolic click Respiratory:  clear to auscultation bilaterally, normal work of breathing GI: soft, nontender, nondistended, + BS MS: no deformity or atrophy Skin: warm and dry, no rash Neuro:  Strength and sensation are intact Psych: euthymic mood, full affect   EKG:  EKG is not ordered today.    Recent Labs: 09/30/2014: ALT 17; BUN 17; Creatinine, Ser 0.79; Potassium 4.5; Sodium 138    Lipid Panel    Component Value Date/Time   CHOL 165 09/30/2014 1033   TRIG 103.0 09/30/2014 1033   HDL 63.50 09/30/2014 1033   CHOLHDL 3 09/30/2014 1033   VLDL 20.6 09/30/2014 1033   LDLCALC 81 09/30/2014 1033   LDLDIRECT 133.0 02/05/2013 1512      Wt Readings from Last 3 Encounters:  10/07/14 126 lb 6.4 oz (57.335 kg)  06/08/14 127 lb 12.8 oz (57.97 kg)  02/10/14 126 lb (57.153 kg)       ASSESSMENT AND PLAN:  1. Mitral valve prolapse 2. Hypercholesterolemia 3.  Low blood pressure 4. history of hepatic hemangiomas, stable 5. premature atrial contraction, less frequent, and occurring mainly when she is overtired 6. Situational stress and insomnia, stable on trazodone and melatonin 7.  Varicose veins worse on the right  leg.  This may be causing some aching in her legs.  Current medicines are reviewed at length with the patient today.  The patient does not have concerns regarding medicines.  The following changes have been made:  We will stop her daily and atenolol.  She does not need it for her blood pressure which is low.  She will have it on hand to use for symptomatic palpitations however.  Labs/ tests ordered today include:  No orders of the defined types were placed in this encounter.    Recheck in 4 months for office  visit lipid panel hepatic function panel and basal metabolic panel.  Berna Spare MD 10/07/2014 10:43 AM    [address], [place], [place]  [address] Phone: [phone]; Fax: [phone]

## 2014-10-07 NOTE — Patient Instructions (Signed)
Medication Instructions:  CHANGE YOUR ATENOLOL TO 1/2 TABLET AS NEED FOR PALPITATIONS  Labwork: NONE  Testing/Procedures: NONE  Follow-Up: Your physician wants you to follow-up in: 4 months with fasting labs (lp/bmet/hfp)  You will receive a reminder letter in the mail two months in advance. If you don't receive a letter, please call our office to schedule the follow-up appointment.

## 2014-11-04 ENCOUNTER — Other Ambulatory Visit: Payer: Self-pay | Admitting: *Deleted

## 2014-11-04 DIAGNOSIS — E7800 Pure hypercholesterolemia, unspecified: Secondary | ICD-10-CM

## 2014-11-27 IMAGING — US US ABDOMEN LIMITED
1 series · 14 of 25 positions shown · non-contrast
Comparison: 03/30/2010.

CLINICAL DATA: Follow up liver lesions.

LIMITED ABDOMINAL ULTRASOUND - RIGHT UPPER QUADRANT

[Series 1: us abdomen limited · 0.20mm/px · 14 of 50 slices shown]
[im 1/50]
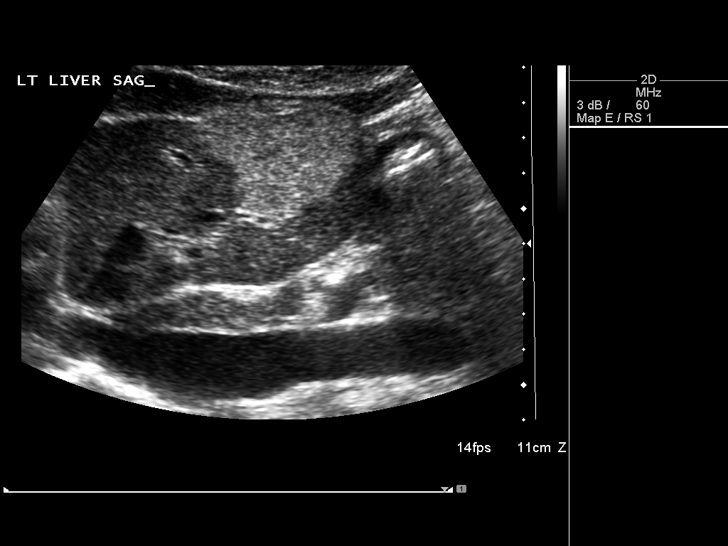
[im 5/50]
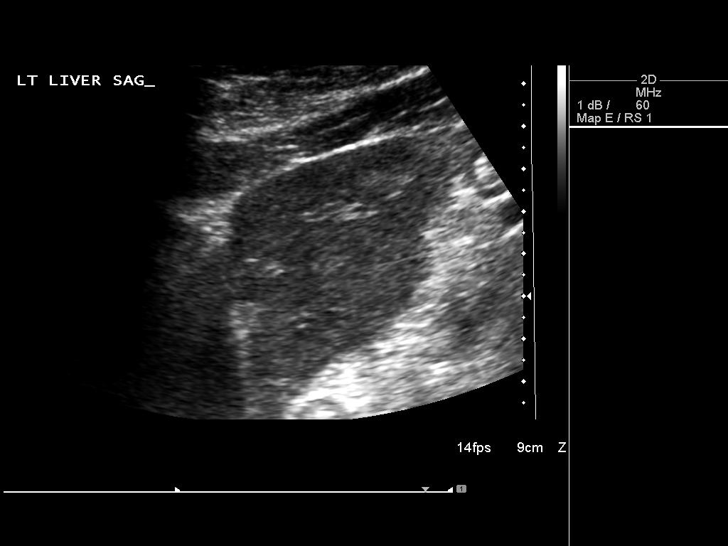
[im 9/50]
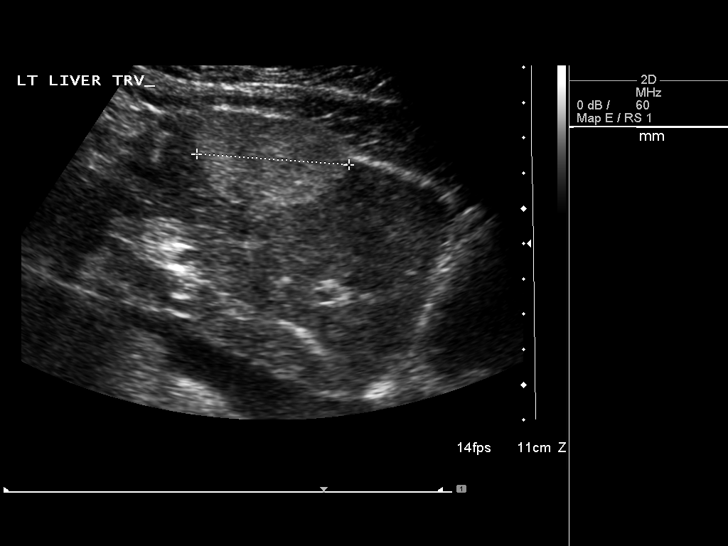
[im 13/50]
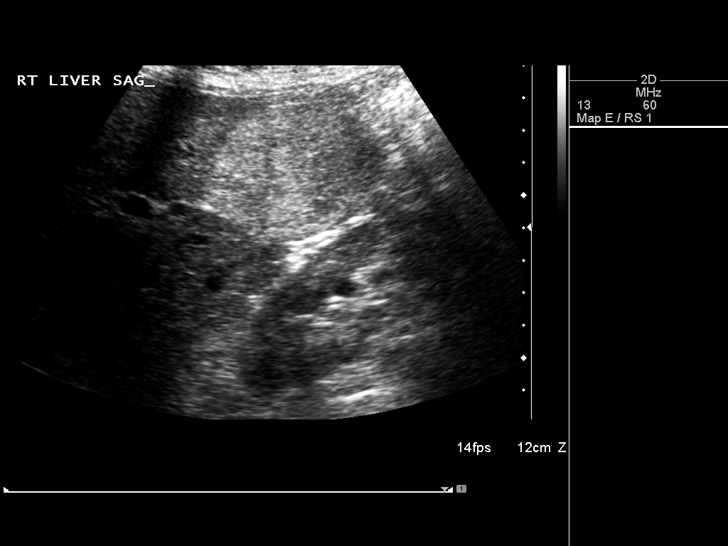
[im 17/50]
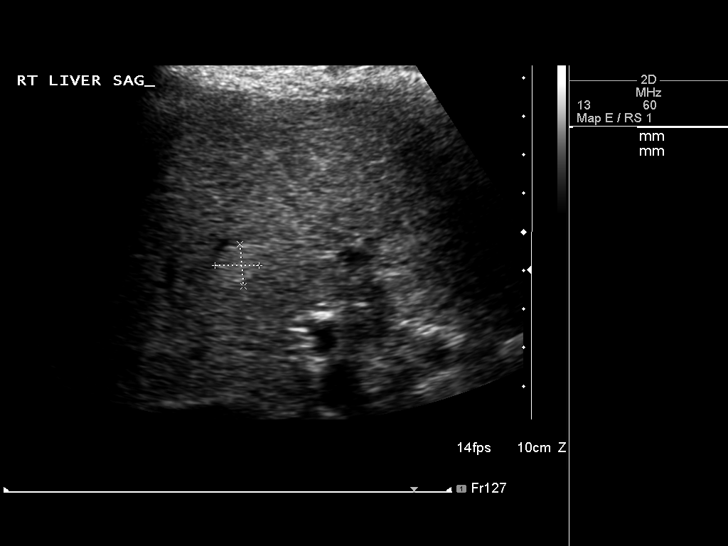
[im 19/50]
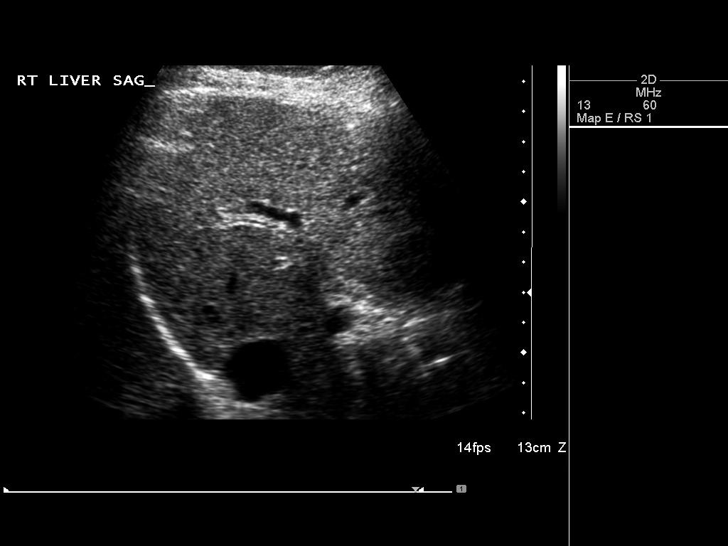
[im 23/50]
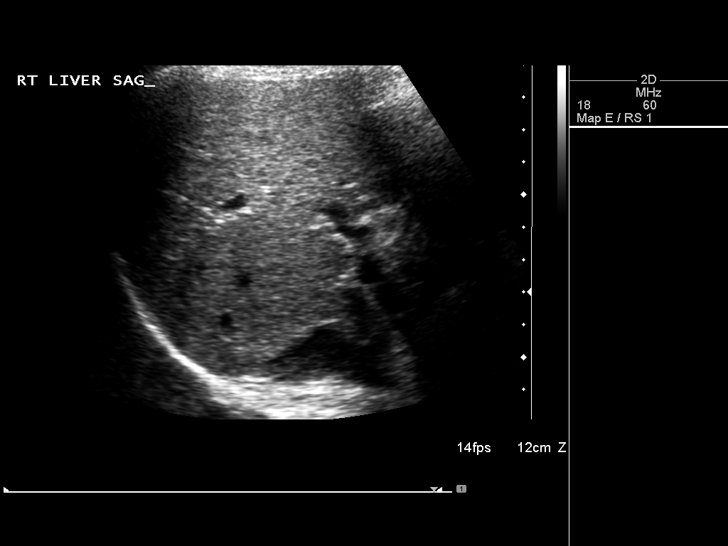
[im 27/50]
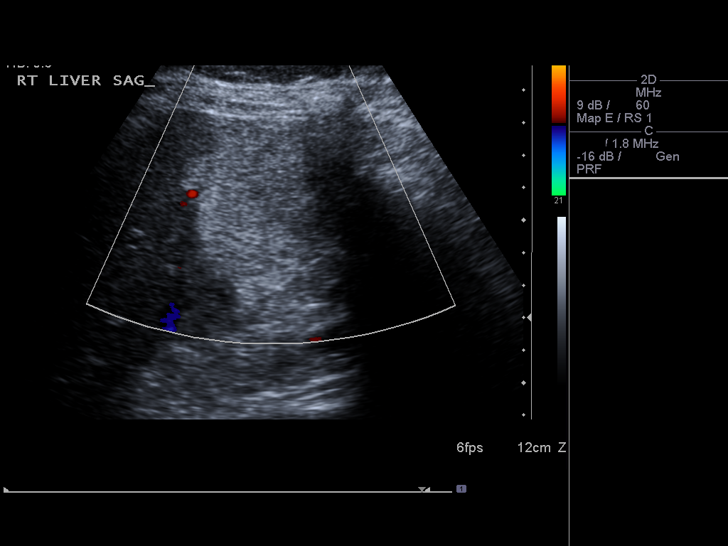
[im 31/50]
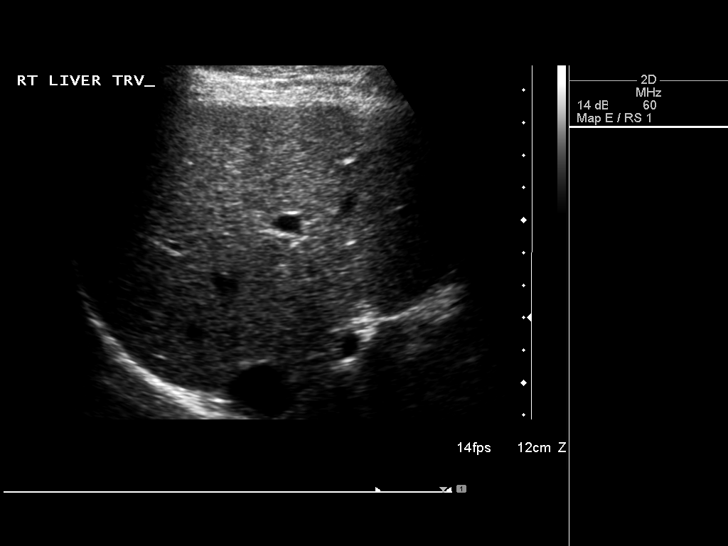
[im 33/50]
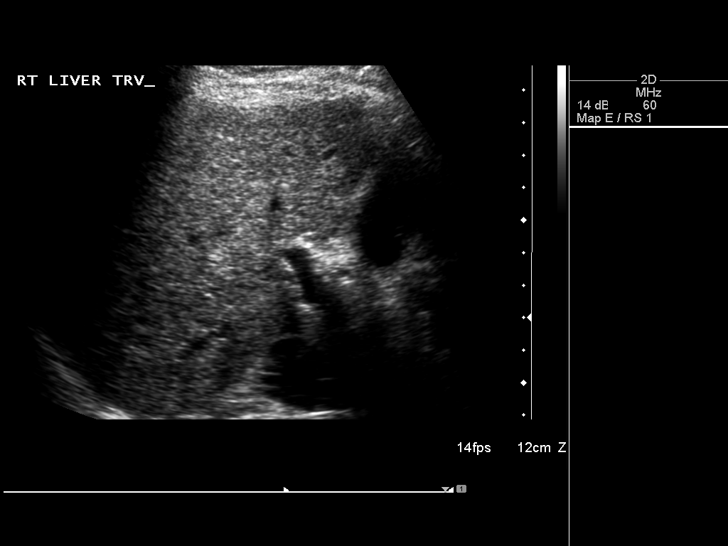
[im 37/50]
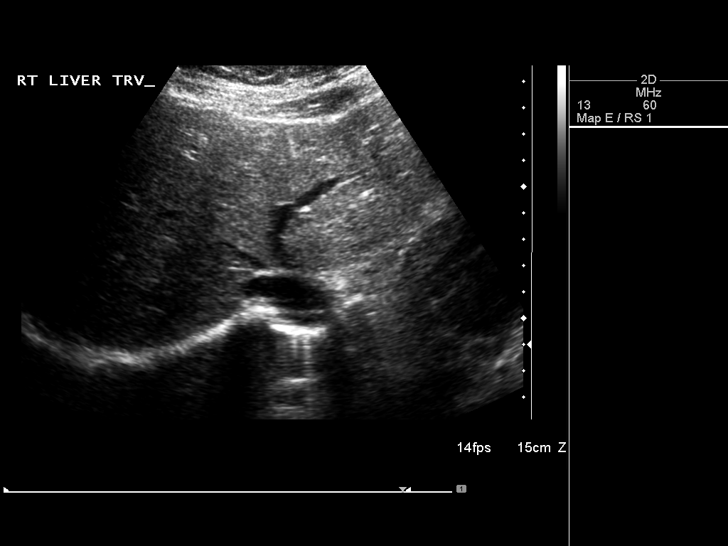
[im 41/50]
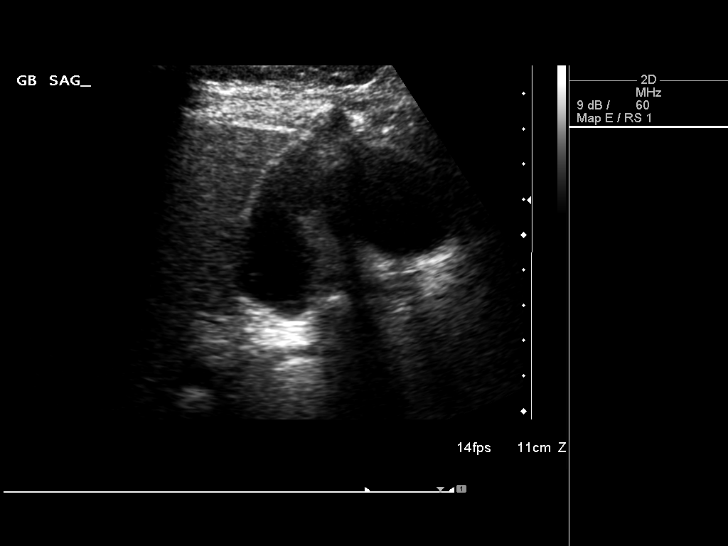
[im 45/50]
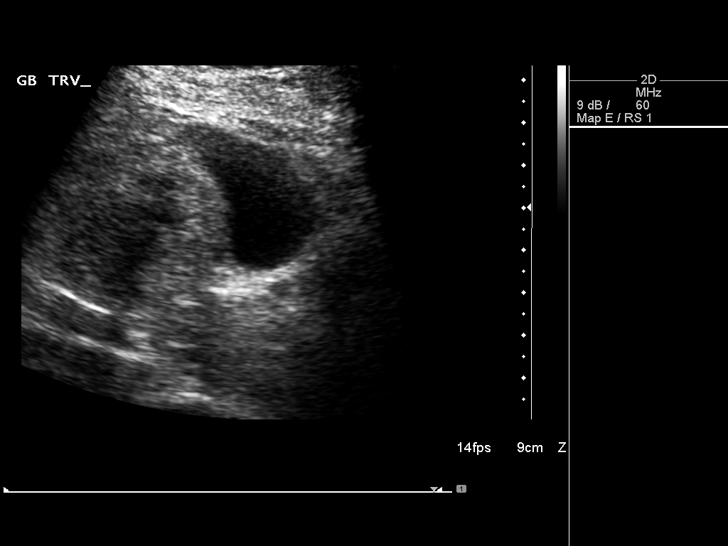
[im 50/50]
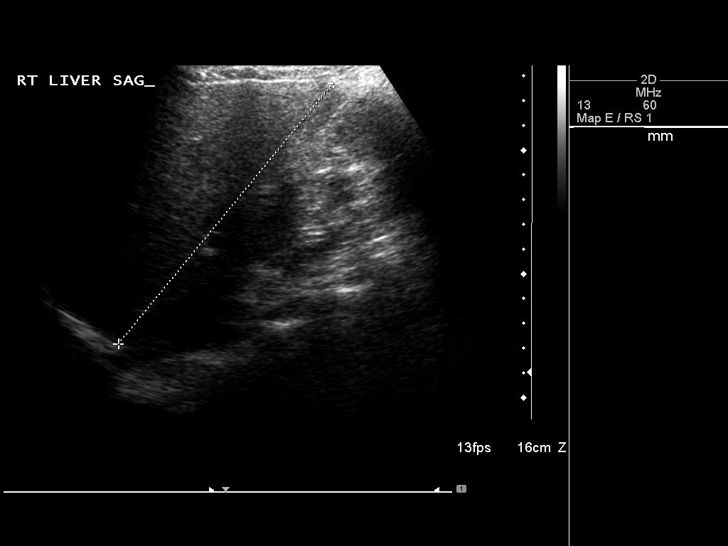

[14 of 25 positions shown; findings below may reference images not displayed]

FINDINGS: Gallbladder:  Normal.  No stones, sludge, pericholecystic fluid or
wall thickening.  No sonographic Murphy's sign.

Common bile duct:  Normal at 5 mm.

Liver:  Several hyperechoic liver lesions are present which appears
similar to the prior exam.
1.  46 mm x 35 mm x 43 mm hyper lesion is present in the left
hepatic lobe.
2.  6 mm x 8 mm x 5 mm hyperechoic lesion in the left hepatic lobe.
3.  56 mm x 44 mm x 48 mm hyperechoic lesion in the right hepatic
lobe.
4.  11 mm x 11 mm x 11 mm hyperechoic lesion in the right hepatic
lobe.
IMPRESSION: Stable hepatic hyperechoic lesions, consistent with a benign
hepatic hemangiomata.

## 2015-01-27 ENCOUNTER — Other Ambulatory Visit: Payer: Medicare Other

## 2015-01-29 ENCOUNTER — Other Ambulatory Visit (INDEPENDENT_AMBULATORY_CARE_PROVIDER_SITE_OTHER): Payer: Medicare Other | Admitting: *Deleted

## 2015-01-29 DIAGNOSIS — E7800 Pure hypercholesterolemia, unspecified: Secondary | ICD-10-CM

## 2015-01-29 LAB — LIPID PANEL
CHOLESTEROL: 182 mg/dL (ref 0–200)
HDL: 52.6 mg/dL (ref >39.00)
LDL CALC: 102 mg/dL — AB (ref 0–99)
NonHDL: 129.09
Total CHOL/HDL Ratio: 3
Triglycerides: 137 mg/dL (ref 0.0–149.0)
VLDL: 27.4 mg/dL (ref 0.0–40.0)

## 2015-01-29 LAB — BASIC METABOLIC PANEL
BUN: 14 mg/dL (ref 6–23)
CALCIUM: 9.5 mg/dL (ref 8.4–10.5)
CO2: 30 mEq/L (ref 19–32)
Chloride: 103 mEq/L (ref 96–112)
Creatinine, Ser: 0.81 mg/dL (ref 0.40–1.20)
GFR: 74.27 mL/min (ref >60.00)
GLUCOSE: 104 mg/dL — AB (ref 70–99)
POTASSIUM: 3.9 meq/L (ref 3.5–5.1)
SODIUM: 139 meq/L (ref 135–145)

## 2015-01-29 LAB — HEPATIC FUNCTION PANEL
ALBUMIN: 4.3 g/dL (ref 3.5–5.2)
ALK PHOS: 56 U/L (ref 39–117)
ALT: 29 U/L (ref 0–35)
AST: 19 U/L (ref 0–37)
Bilirubin, Direct: 0.1 mg/dL (ref 0.0–0.3)
Total Bilirubin: 0.7 mg/dL (ref 0.2–1.2)
Total Protein: 7.1 g/dL (ref 6.0–8.3)

## 2015-02-03 ENCOUNTER — Ambulatory Visit (INDEPENDENT_AMBULATORY_CARE_PROVIDER_SITE_OTHER): Payer: Medicare Other | Admitting: Cardiology

## 2015-02-03 ENCOUNTER — Encounter: Payer: Self-pay | Admitting: Cardiology

## 2015-02-03 VITALS — BP 140/80 | HR 80 | Ht 64.5 in | Wt 125.8 lb

## 2015-02-03 DIAGNOSIS — E7800 Pure hypercholesterolemia, unspecified: Secondary | ICD-10-CM | POA: Diagnosis not present

## 2015-02-03 DIAGNOSIS — I341 Nonrheumatic mitral (valve) prolapse: Secondary | ICD-10-CM

## 2015-02-03 DIAGNOSIS — G4700 Insomnia, unspecified: Secondary | ICD-10-CM | POA: Diagnosis not present

## 2015-02-03 NOTE — Patient Instructions (Signed)
Medication Instructions:  Your physician recommends that you continue on your current medications as directed. Please refer to the Current Medication list given to you today.  Labwork: NONE  Testing/Procedures: NONE  Follow-Up: Your physician recommends that you schedule a follow-up appointment in: [place] [facility]

## 2015-02-03 NOTE — Progress Notes (Signed)
Cardiology Office Note   Date:  02/03/2015   ID:  Alec, Mcphee Aug 06, 1945, MRN [id number]  PCP:  Tivis Ringer, MD  Cardiologist: Darlin Coco MD  No chief complaint on file.     History of Present Illness:  Shannon Brennan is a 69 y.o. female who presents for followup office visit.  This pleasant 69 year old woman is seen for a four-month followup office visit. She has a past history of mitral valve prolapse and a history of hypercholesterolemia. She also has a history of hepatic hemangiomas. Previously she has had successful surgery for a ruptured disc which was causing painful sciatica. Dr. Ellene Route is her neurosurgeon.  . She just has some many things in her life is so busy that it is hard for her to settle down at night. He is the primary caregiver for her elderly mother.  Her blood pressure has been low most of the time and she has had to skip taking her atenolol frequently. The patient has not been able to get any regular exercise schedule in place. She has been taking one fourth of a 25 mg atenolol each morning instead of just as needed.  Occasionally she will take a second fourth in the evening if her heart rate is elevated. She had a bad respiratory virus last week.  She was febrile for 5 days.  She called her PCP who called her in a Z-Pak  Past Medical History  Diagnosis Date  . Hyperlipidemia     tolerates low dose Lipitor  . Mitral valve prolapse     last echo in 2005 showing mild MVP with normal LV function  . Hepatic hemangioma     history of -- followe by Dr. Cristina Gong  . Palpitations   . Family history of cardiovascular disease   . Normal nuclear stress test 2006  . Hx of osteopenia   . Allergic rhinitis   . Insomnia   . Pyuria   . Vaginal atrophy   . UTI (urinary tract infection)   . Diverticulosis   . Arthritis     mild in right thumb  . Hypertension     controlled with atenolol    Past Surgical History  Procedure Laterality Date    . Partial hysterectomy    . Tonsillectomy    . Abdominal hysterectomy    . Lumbar laminectomy/decompression microdiscectomy  01/04/2012    Procedure: LUMBAR LAMINECTOMY/DECOMPRESSION MICRODISCECTOMY 1 LEVEL;  Surgeon: Kristeen Miss, MD;  Location: [facility];  Service: Neurosurgery;  Laterality: Left;  Left Lumbar Five-Sacral One Microdiskectomy     Current Outpatient Prescriptions  Medication Sig Dispense Refill  . atenolol (TENORMIN) 25 MG tablet Take by mouth daily. Take 1/4 tablet by mouth daily for palpitations    . CALCIUM PO Take 1-2 tablets by mouth 2 (two) times daily. Bone Up. Takes 1 tablet in am and 2 tablets in pm    . Estradiol 10 MCG TABS Place 1 tablet vaginally 2 (two) times a week.    . NON FORMULARY ISAGENIX  Drink 1 shake daily    . Nutritional Supplements (MELATONIN PO) Take 3 mg by mouth at bedtime.     Marland Kitchen PREBIOTIC PRODUCT PO Take 1 scoop by mouth daily. Mixes with 4 ounces of H2O    . traZODone (DESYREL) 50 MG tablet Take 75 mg by mouth daily. Take 1 and 1/2 tablets by mouth daily     No current facility-administered medications for this visit.  Allergies:   Erythromycin; Flagyl; Levofloxacin; Wellbutrin; Zoloft; and Restoril    Social History:  The patient  reports that she has never smoked. She has never used smokeless tobacco. She reports that she does not drink alcohol or use illicit drugs.   Family History:  The patient's family history includes Arthritis in her father and mother; Cancer in her father and mother; Coronary artery disease in her brother; Diabetes in her brother; Heart attack in her brother; Hyperlipidemia in her mother; Hypertension in her brother, brother, and mother; Multiple sclerosis in her brother; Osteopenia in her mother; Stroke in her mother; Ulcers in her mother.    ROS:  Please see the history of present illness.   Otherwise, review of systems are positive for none.   All other systems are reviewed and negative.    PHYSICAL  EXAM: VS:  BP 140/80 mmHg  Pulse 80  Ht 5' 4.5" (1.638 m)  Wt 125 lb 12.8 oz (57.063 kg)  BMI 21.27 kg/m2 , BMI Body mass index is 21.27 kg/(m^2). GEN: Well nourished, well developed, in no acute distress HEENT: normal Neck: no JVD, carotid bruits, or masses Cardiac: RRR; no murmurs, rubs, or gallops,no edema .  There is a midsystolic click Respiratory:  clear to auscultation bilaterally, normal work of breathing GI: soft, nontender, nondistended, + BS MS: no deformity or atrophy Skin: warm and dry, no rash Neuro:  Strength and sensation are intact Psych: euthymic mood, full affect   EKG:  EKG is not ordered today.    Recent Labs: 01/29/2015: ALT 29; BUN 14; Creatinine, Ser 0.81; Potassium 3.9; Sodium 139    Lipid Panel    Component Value Date/Time   CHOL 182 01/29/2015 1033   TRIG 137.0 01/29/2015 1033   HDL 52.60 01/29/2015 1033   CHOLHDL 3 01/29/2015 1033   VLDL 27.4 01/29/2015 1033   LDLCALC 102* 01/29/2015 1033   LDLDIRECT 133.0 02/05/2013 1512      Wt Readings from Last 3 Encounters:  02/03/15 125 lb 12.8 oz (57.063 kg)  10/07/14 126 lb 6.4 oz (57.335 kg)  06/08/14 127 lb 12.8 oz (57.97 kg)        ASSESSMENT AND PLAN:  1. Mitral valve prolapse 2. Hypercholesterolemia.  She has been off her statin for the last 4 months and her lipids remain acceptable.  She will continue off statins. 3. Low blood pressure 4. history of hepatic hemangiomas, stable 5. premature atrial contraction, less frequent, and occurring mainly when she is overtired 6. Situational stress and insomnia, stable on trazodone and melatonin 7. Varicose veins worse on the right leg. This may be causing some aching in her legs.   Current medicines are reviewed at length with the patient today.  The patient does not have concerns regarding medicines.  The following changes have been made:  no change  Labs/ tests ordered today include:  No orders of the defined types were placed in  this encounter.    Disposition: She will continue current medication.  Recheck in 4 months for office visit.   Berna Spare MD 02/03/2015 5:22 PM    [facility] [place], [place], [place]  [address] Phone: [phone]; Fax: [phone]

## 2015-06-09 ENCOUNTER — Encounter: Payer: Self-pay | Admitting: Nurse Practitioner

## 2015-06-09 ENCOUNTER — Ambulatory Visit (INDEPENDENT_AMBULATORY_CARE_PROVIDER_SITE_OTHER): Payer: Medicare Other | Admitting: Nurse Practitioner

## 2015-06-09 VITALS — BP 140/80 | HR 59 | Ht 64.0 in | Wt 126.0 lb

## 2015-06-09 DIAGNOSIS — E7800 Pure hypercholesterolemia, unspecified: Secondary | ICD-10-CM | POA: Diagnosis not present

## 2015-06-09 DIAGNOSIS — I341 Nonrheumatic mitral (valve) prolapse: Secondary | ICD-10-CM

## 2015-06-09 MED ORDER — ATORVASTATIN CALCIUM 10 MG PO TABS: 5.0000 mg | ORAL_TABLET | ORAL | Status: DC

## 2015-06-09 NOTE — Progress Notes (Signed)
CARDIOLOGY OFFICE NOTE  Date:  06/09/2015    Shannon Brennan Date of Birth: 1946-04-28 Medical Record [id number]  PCP:  Tivis Ringer, MD  Cardiologist:  Mare Ferrari - following with me going forward.   Chief Complaint  Patient presents with  . Cardiac Valve Problem  . Hyperlipidemia    Follow up visit - seen for Dr. Mare Ferrari    History of Present Illness: Shannon Brennan is a 70 y.o. female who presents today for a 4 month check. Seen for Dr. Mare Ferrari.   She has a history of MVP, HLD, and a history of hepatic hemangiomas. Last echo dates back to 2005 - had MVP with mild MR noted. She does not have known CAD.   She was last seen in October - felt to be doing ok. She was taking one fourth of a 25 mg atenolol each morning instead of just as needed. Occasionally she would take a second fourth in the evening if her heart rate was elevated.  Comes back today. Here alone. She is not doing as well as she normally does. BP running a little higher. Little more short of breath. Palpitations seem at baseline. Using just a 1/4 of her Atenolol. Had recent labs with Dr. Dagmar Hait last month - cholesterol back up - he wanted her back on Lipitor - she has had trouble tolerating in the past. She is interested in 3 days a week therapy. No chest pain. Not dizzy or lightheaded. She is the primary care giver for her mother - Shannon Brennan - who will be turning 98 this week. Lots of stress with this role.    Past Medical History  Diagnosis Date  . Hyperlipidemia     tolerates low dose Lipitor  . Mitral valve prolapse     last echo in 2005 showing mild MVP with normal LV function  . Hepatic hemangioma     history of -- followe by Dr. Cristina Gong  . Palpitations   . Family history of cardiovascular disease   . Normal nuclear stress test 2006  . Hx of osteopenia   . Allergic rhinitis   . Insomnia   . Pyuria   . Vaginal atrophy   . UTI (urinary tract infection)   . Diverticulosis   .  Arthritis     mild in right thumb  . Hypertension     controlled with atenolol    Past Surgical History  Procedure Laterality Date  . Partial hysterectomy    . Tonsillectomy    . Abdominal hysterectomy    . Lumbar laminectomy/decompression microdiscectomy  01/04/2012    Procedure: LUMBAR LAMINECTOMY/DECOMPRESSION MICRODISCECTOMY 1 LEVEL;  Surgeon: Kristeen Miss, MD;  Location: [facility];  Service: Neurosurgery;  Laterality: Left;  Left Lumbar Five-Sacral One Microdiskectomy     Medications: Current Outpatient Prescriptions  Medication Sig Dispense Refill  . atenolol (TENORMIN) 25 MG tablet Take 12.5 mg by mouth daily.    Marland Kitchen CALCIUM PO Take 1-2 tablets by mouth 2 (two) times daily. Bone Up. Takes 1 tablet in am and 2 tablets in pm    . Estradiol 10 MCG TABS Place 1 tablet vaginally 2 (two) times a week.    . NON FORMULARY ISAGENIX  Drink 1 shake daily    . Nutritional Supplements (MELATONIN PO) Take 3 mg by mouth at bedtime.     . traZODone (DESYREL) 50 MG tablet Take 75 mg by mouth daily. Take 1 and 1/2 tablets by mouth daily    .  atorvastatin (LIPITOR) 10 MG tablet Take 0.5 tablets (5 mg total) by mouth 3 (three) times a week.     No current facility-administered medications for this visit.    Allergies: Allergies  Allergen Reactions  . Erythromycin Diarrhea  . Flagyl [Metronidazole Hcl] Nausea Only  . Levofloxacin Other (See Comments)    Headache  . Wellbutrin [Bupropion Hcl] Other (See Comments)    Dizziness  . Zoloft [Sertraline Hcl]     [employer]   . Restoril Palpitations    Social History: The patient  reports that she has never smoked. She has never used smokeless tobacco. She reports that she does not drink alcohol or use illicit drugs.   Family History: The patient's family history includes Arthritis in her father and mother; Cancer in her father and mother; Coronary artery disease in her brother; Diabetes in her brother; Heart attack in her brother;  Hyperlipidemia in her mother; Hypertension in her brother, brother, and mother; Multiple sclerosis in her brother; Osteopenia in her mother; Stroke in her mother; Ulcers in her mother.   Review of Systems: Please see the history of present illness.   Otherwise, the review of systems is positive for none.   All other systems are reviewed and negative.   Physical Exam: VS:  BP 140/80 mmHg  Pulse 59  Ht 5\' 4"  (1.626 m)  Wt 126 lb (57.153 kg)  BMI 21.62 kg/m2 .  BMI Body mass index is 21.62 kg/(m^2).  Wt Readings from Last 3 Encounters:  06/09/15 126 lb (57.153 kg)  02/03/15 125 lb 12.8 oz (57.063 kg)  10/07/14 126 lb 6.4 oz (57.335 kg)   BP is 144/70 by me.   General: Pleasant. Well developed, well nourished and in no acute distress.  HEENT: Normal. Neck: Supple, no JVD, carotid bruits, or masses noted.  Cardiac: Regular rate and rhythm. +MVP click - no real murmur that I can appreciate. No edema.  Respiratory:  Lungs are clear to auscultation bilaterally with normal work of breathing.  GI: Soft and nontender.  MS: No deformity or atrophy. Gait and ROM intact. Skin: Warm and dry. Color is normal.  Neuro:  Strength and sensation are intact and no gross focal deficits noted.  Psych: Alert, appropriate and with normal affect.   LABORATORY DATA:  EKG:  EKG is ordered today. This shows NSR - rate of 59 with PACs  Lab Results  Component Value Date   WBC 10.3 01/03/2012   HGB 14.1 01/03/2012   HCT 42.2 01/03/2012   PLT 245 01/03/2012   GLUCOSE 104* 01/29/2015   CHOL 182 01/29/2015   TRIG 137.0 01/29/2015   HDL 52.60 01/29/2015   LDLDIRECT 133.0 02/05/2013   LDLCALC 102* 01/29/2015   ALT 29 01/29/2015   AST 19 01/29/2015   NA 139 01/29/2015   K 3.9 01/29/2015   CL 103 01/29/2015   CREATININE 0.81 01/29/2015   BUN 14 01/29/2015   CO2 30 01/29/2015    BNP (last 3 results) No results for input(s): BNP in the last 8760 hours.  ProBNP (last 3 results) No results for  input(s): PROBNP in the last 8760 hours.   Other Studies Reviewed Today:   Assessment/Plan: 1. MVP with mild MR from prior echo - BP up some. Little short of breath. Will get her echo updated. I suspect most of her issue is centered around being the caregiver role - we discussed this at length today.   2. Palpitations - ok on low dose beta blocker.  3. HLD - labs followed by PCP - agreeable to restarting Lipitor 5 mg three times a week  4. Elevated BP - will increase her Atenolol to 1/2 tablet. She will continue to monitor at home.   5. Situational stress - discussed at length today.   Current medicines are reviewed with the patient today.  The patient does not have concerns regarding medicines other than what has been noted above.  The following changes have been made:  See above.  Labs/ tests ordered today include:    Orders Placed This Encounter  Procedures  . Basic metabolic panel  . Hepatic function panel  . Lipid panel  . EKG 12-Lead  . ECHOCARDIOGRAM COMPLETE     Disposition:   She would like to follow with me going forward. FU with me in 6 months.   Patient is agreeable to this plan and will call if any problems develop in the interim.   Signed: Burtis Junes, RN, ANP-C 06/09/2015 11:48 AM  [facility] [address] [place], [place]  [address] Phone: [phone] Fax: [phone]

## 2015-06-09 NOTE — Patient Instructions (Addendum)
.  We will be checking the following labs today - NONE  Fasting labs in 6 weeks   Medication Instructions:    Continue with your current medicines. BUT  Start Lipitor 10 mg take just 1/2 tablet on Monday-Wednesday-Friday    Testing/Procedures To Be Arranged:  Echocardiogram  Follow-Up:   See me in 6 months    Other Special Instructions:   N/A    If you need a refill on your cardiac medications before your next appointment, please call your pharmacy.   Call the [place] office at [phone] if you have any questions, problems or concerns.

## 2015-06-22 DIAGNOSIS — M810 Age-related osteoporosis without current pathological fracture: Secondary | ICD-10-CM | POA: Diagnosis not present

## 2015-06-23 ENCOUNTER — Other Ambulatory Visit: Payer: Self-pay

## 2015-06-23 ENCOUNTER — Ambulatory Visit (HOSPITAL_COMMUNITY): Payer: Medicare Other | Attending: Cardiology

## 2015-06-23 DIAGNOSIS — I340 Nonrheumatic mitral (valve) insufficiency: Secondary | ICD-10-CM | POA: Diagnosis not present

## 2015-06-23 DIAGNOSIS — I351 Nonrheumatic aortic (valve) insufficiency: Secondary | ICD-10-CM | POA: Diagnosis not present

## 2015-06-23 DIAGNOSIS — E7800 Pure hypercholesterolemia, unspecified: Secondary | ICD-10-CM

## 2015-06-23 DIAGNOSIS — I10 Essential (primary) hypertension: Secondary | ICD-10-CM | POA: Diagnosis not present

## 2015-06-23 DIAGNOSIS — Z8249 Family history of ischemic heart disease and other diseases of the circulatory system: Secondary | ICD-10-CM | POA: Diagnosis not present

## 2015-06-23 DIAGNOSIS — I059 Rheumatic mitral valve disease, unspecified: Secondary | ICD-10-CM | POA: Diagnosis present

## 2015-06-23 DIAGNOSIS — I341 Nonrheumatic mitral (valve) prolapse: Secondary | ICD-10-CM | POA: Diagnosis not present

## 2015-06-24 ENCOUNTER — Other Ambulatory Visit: Payer: Self-pay | Admitting: Gastroenterology

## 2015-06-24 DIAGNOSIS — K769 Liver disease, unspecified: Secondary | ICD-10-CM

## 2015-07-14 ENCOUNTER — Other Ambulatory Visit (INDEPENDENT_AMBULATORY_CARE_PROVIDER_SITE_OTHER): Payer: Medicare Other | Admitting: *Deleted

## 2015-07-14 DIAGNOSIS — E7800 Pure hypercholesterolemia, unspecified: Secondary | ICD-10-CM | POA: Diagnosis not present

## 2015-07-14 DIAGNOSIS — I341 Nonrheumatic mitral (valve) prolapse: Secondary | ICD-10-CM

## 2015-07-14 LAB — HEPATIC FUNCTION PANEL
ALT: 22 U/L (ref 6–29)
AST: 19 U/L (ref 10–35)
Albumin: 4.1 g/dL (ref 3.6–5.1)
Alkaline Phosphatase: 48 U/L (ref 33–130)
Bilirubin, Direct: 0.1 mg/dL (ref <=0.2)
Indirect Bilirubin: 0.7 mg/dL (ref 0.2–1.2)
Total Bilirubin: 0.8 mg/dL (ref 0.2–1.2)
Total Protein: 6.7 g/dL (ref 6.1–8.1)

## 2015-07-14 LAB — BASIC METABOLIC PANEL
BUN: 18 mg/dL (ref 7–25)
CO2: 25 mmol/L (ref 20–31)
Calcium: 9.7 mg/dL (ref 8.6–10.4)
Chloride: 102 mmol/L (ref 98–110)
Creat: 0.85 mg/dL (ref 0.60–0.93)
Glucose, Bld: 91 mg/dL (ref 65–99)
Potassium: 4.3 mmol/L (ref 3.5–5.3)
Sodium: 140 mmol/L (ref 135–146)

## 2015-07-14 LAB — LIPID PANEL
Cholesterol: 168 mg/dL (ref 125–200)
HDL: 72 mg/dL (ref >=46)
LDL Cholesterol: 78 mg/dL (ref <130)
Total CHOL/HDL Ratio: 2.3 Ratio (ref <=5.0)
Triglycerides: 90 mg/dL (ref <150)
VLDL: 18 mg/dL (ref <30)

## 2015-07-20 ENCOUNTER — Ambulatory Visit
Admission: RE | Admit: 2015-07-20 | Discharge: 2015-07-20 | Disposition: A | Discharge status: Home / Self Care | Payer: Medicare Other | Source: Ambulatory Visit | Attending: Gastroenterology | Admitting: Gastroenterology

## 2015-07-20 ENCOUNTER — Other Ambulatory Visit: Payer: Medicare Other

## 2015-07-20 DIAGNOSIS — K769 Liver disease, unspecified: Secondary | ICD-10-CM

## 2015-07-20 DIAGNOSIS — K6389 Other specified diseases of intestine: Secondary | ICD-10-CM | POA: Diagnosis not present

## 2015-07-23 DIAGNOSIS — H0014 Chalazion left upper eyelid: Secondary | ICD-10-CM | POA: Diagnosis not present

## 2015-07-26 DIAGNOSIS — H0014 Chalazion left upper eyelid: Secondary | ICD-10-CM | POA: Diagnosis not present

## 2015-07-27 DIAGNOSIS — H5052 Exophoria: Secondary | ICD-10-CM | POA: Diagnosis not present

## 2015-08-30 DIAGNOSIS — M9903 Segmental and somatic dysfunction of lumbar region: Secondary | ICD-10-CM | POA: Diagnosis not present

## 2015-08-30 DIAGNOSIS — M545 Low back pain: Secondary | ICD-10-CM | POA: Diagnosis not present

## 2015-08-30 DIAGNOSIS — M47816 Spondylosis without myelopathy or radiculopathy, lumbar region: Secondary | ICD-10-CM | POA: Diagnosis not present

## 2015-08-30 DIAGNOSIS — M5030 Other cervical disc degeneration, unspecified cervical region: Secondary | ICD-10-CM | POA: Diagnosis not present

## 2015-09-02 DIAGNOSIS — N390 Urinary tract infection, site not specified: Secondary | ICD-10-CM | POA: Diagnosis not present

## 2015-09-02 DIAGNOSIS — R350 Frequency of micturition: Secondary | ICD-10-CM | POA: Diagnosis not present

## 2015-09-02 DIAGNOSIS — Z6831 Body mass index (BMI) 31.0-31.9, adult: Secondary | ICD-10-CM | POA: Diagnosis not present

## 2015-09-06 DIAGNOSIS — M545 Low back pain: Secondary | ICD-10-CM | POA: Diagnosis not present

## 2015-09-06 DIAGNOSIS — M5030 Other cervical disc degeneration, unspecified cervical region: Secondary | ICD-10-CM | POA: Diagnosis not present

## 2015-09-06 DIAGNOSIS — M9903 Segmental and somatic dysfunction of lumbar region: Secondary | ICD-10-CM | POA: Diagnosis not present

## 2015-09-06 DIAGNOSIS — M47816 Spondylosis without myelopathy or radiculopathy, lumbar region: Secondary | ICD-10-CM | POA: Diagnosis not present

## 2015-09-07 DIAGNOSIS — N952 Postmenopausal atrophic vaginitis: Secondary | ICD-10-CM | POA: Diagnosis not present

## 2015-09-07 DIAGNOSIS — R208 Other disturbances of skin sensation: Secondary | ICD-10-CM | POA: Diagnosis not present

## 2015-09-13 DIAGNOSIS — N390 Urinary tract infection, site not specified: Secondary | ICD-10-CM | POA: Diagnosis not present

## 2015-09-20 DIAGNOSIS — M5030 Other cervical disc degeneration, unspecified cervical region: Secondary | ICD-10-CM | POA: Diagnosis not present

## 2015-09-20 DIAGNOSIS — M545 Low back pain: Secondary | ICD-10-CM | POA: Diagnosis not present

## 2015-09-20 DIAGNOSIS — M47816 Spondylosis without myelopathy or radiculopathy, lumbar region: Secondary | ICD-10-CM | POA: Diagnosis not present

## 2015-09-20 DIAGNOSIS — M9903 Segmental and somatic dysfunction of lumbar region: Secondary | ICD-10-CM | POA: Diagnosis not present

## 2015-09-28 DIAGNOSIS — M9903 Segmental and somatic dysfunction of lumbar region: Secondary | ICD-10-CM | POA: Diagnosis not present

## 2015-09-28 DIAGNOSIS — M5030 Other cervical disc degeneration, unspecified cervical region: Secondary | ICD-10-CM | POA: Diagnosis not present

## 2015-09-28 DIAGNOSIS — M545 Low back pain: Secondary | ICD-10-CM | POA: Diagnosis not present

## 2015-09-28 DIAGNOSIS — Z1231 Encounter for screening mammogram for malignant neoplasm of breast: Secondary | ICD-10-CM | POA: Diagnosis not present

## 2015-09-28 DIAGNOSIS — M47816 Spondylosis without myelopathy or radiculopathy, lumbar region: Secondary | ICD-10-CM | POA: Diagnosis not present

## 2015-10-14 DIAGNOSIS — H524 Presbyopia: Secondary | ICD-10-CM | POA: Diagnosis not present

## 2015-10-18 ENCOUNTER — Other Ambulatory Visit: Payer: Self-pay | Admitting: *Deleted

## 2015-10-18 DIAGNOSIS — M47816 Spondylosis without myelopathy or radiculopathy, lumbar region: Secondary | ICD-10-CM | POA: Diagnosis not present

## 2015-10-18 DIAGNOSIS — M5030 Other cervical disc degeneration, unspecified cervical region: Secondary | ICD-10-CM | POA: Diagnosis not present

## 2015-10-18 DIAGNOSIS — M545 Low back pain: Secondary | ICD-10-CM | POA: Diagnosis not present

## 2015-10-18 DIAGNOSIS — M9903 Segmental and somatic dysfunction of lumbar region: Secondary | ICD-10-CM | POA: Diagnosis not present

## 2015-10-18 MED ORDER — ATENOLOL 25 MG PO TABS: 12.5000 mg | ORAL_TABLET | Freq: Every day | ORAL | Status: DC

## 2015-10-19 ENCOUNTER — Other Ambulatory Visit: Payer: Self-pay

## 2015-10-19 MED ORDER — ATENOLOL 25 MG PO TABS: 12.5000 mg | ORAL_TABLET | Freq: Every day | ORAL | Status: DC

## 2015-10-19 NOTE — Telephone Encounter (Signed)
Burtis Junes, NP at 06/09/2015 11:17 AM  atenolol (TENORMIN) 25 MG tabletTake 12.5 mg by mouth daily 4. Elevated BP - will increase her Atenolol to 1/2 tablet. She will continue to monitor at home.

## 2015-10-26 DIAGNOSIS — M47816 Spondylosis without myelopathy or radiculopathy, lumbar region: Secondary | ICD-10-CM | POA: Diagnosis not present

## 2015-10-26 DIAGNOSIS — M5030 Other cervical disc degeneration, unspecified cervical region: Secondary | ICD-10-CM | POA: Diagnosis not present

## 2015-10-26 DIAGNOSIS — M545 Low back pain: Secondary | ICD-10-CM | POA: Diagnosis not present

## 2015-10-26 DIAGNOSIS — M9903 Segmental and somatic dysfunction of lumbar region: Secondary | ICD-10-CM | POA: Diagnosis not present

## 2015-11-01 ENCOUNTER — Encounter: Payer: Self-pay | Admitting: Nurse Practitioner

## 2015-11-01 DIAGNOSIS — E784 Other hyperlipidemia: Secondary | ICD-10-CM | POA: Diagnosis not present

## 2015-11-09 DIAGNOSIS — M810 Age-related osteoporosis without current pathological fracture: Secondary | ICD-10-CM | POA: Diagnosis not present

## 2015-11-09 DIAGNOSIS — E784 Other hyperlipidemia: Secondary | ICD-10-CM | POA: Diagnosis not present

## 2015-11-11 DIAGNOSIS — M47816 Spondylosis without myelopathy or radiculopathy, lumbar region: Secondary | ICD-10-CM | POA: Diagnosis not present

## 2015-11-11 DIAGNOSIS — M5030 Other cervical disc degeneration, unspecified cervical region: Secondary | ICD-10-CM | POA: Diagnosis not present

## 2015-11-11 DIAGNOSIS — M9903 Segmental and somatic dysfunction of lumbar region: Secondary | ICD-10-CM | POA: Diagnosis not present

## 2015-11-11 DIAGNOSIS — M545 Low back pain: Secondary | ICD-10-CM | POA: Diagnosis not present

## 2015-11-18 DIAGNOSIS — N952 Postmenopausal atrophic vaginitis: Secondary | ICD-10-CM | POA: Diagnosis not present

## 2015-11-18 DIAGNOSIS — M8580 Other specified disorders of bone density and structure, unspecified site: Secondary | ICD-10-CM | POA: Diagnosis not present

## 2015-11-18 DIAGNOSIS — Z01419 Encounter for gynecological examination (general) (routine) without abnormal findings: Secondary | ICD-10-CM | POA: Diagnosis not present

## 2015-11-18 DIAGNOSIS — M810 Age-related osteoporosis without current pathological fracture: Secondary | ICD-10-CM | POA: Diagnosis not present

## 2015-11-18 DIAGNOSIS — Z6822 Body mass index (BMI) 22.0-22.9, adult: Secondary | ICD-10-CM | POA: Diagnosis not present

## 2015-11-23 DIAGNOSIS — M9903 Segmental and somatic dysfunction of lumbar region: Secondary | ICD-10-CM | POA: Diagnosis not present

## 2015-11-23 DIAGNOSIS — M545 Low back pain: Secondary | ICD-10-CM | POA: Diagnosis not present

## 2015-11-23 DIAGNOSIS — M47816 Spondylosis without myelopathy or radiculopathy, lumbar region: Secondary | ICD-10-CM | POA: Diagnosis not present

## 2015-11-23 DIAGNOSIS — M5030 Other cervical disc degeneration, unspecified cervical region: Secondary | ICD-10-CM | POA: Diagnosis not present

## 2015-11-24 ENCOUNTER — Encounter: Payer: Self-pay | Admitting: *Deleted

## 2015-11-30 DIAGNOSIS — M5030 Other cervical disc degeneration, unspecified cervical region: Secondary | ICD-10-CM | POA: Diagnosis not present

## 2015-11-30 DIAGNOSIS — M545 Low back pain: Secondary | ICD-10-CM | POA: Diagnosis not present

## 2015-11-30 DIAGNOSIS — M9903 Segmental and somatic dysfunction of lumbar region: Secondary | ICD-10-CM | POA: Diagnosis not present

## 2015-11-30 DIAGNOSIS — M47816 Spondylosis without myelopathy or radiculopathy, lumbar region: Secondary | ICD-10-CM | POA: Diagnosis not present

## 2015-12-14 ENCOUNTER — Ambulatory Visit (INDEPENDENT_AMBULATORY_CARE_PROVIDER_SITE_OTHER): Payer: Medicare Other | Admitting: Nurse Practitioner

## 2015-12-14 ENCOUNTER — Encounter (INDEPENDENT_AMBULATORY_CARE_PROVIDER_SITE_OTHER): Payer: Self-pay

## 2015-12-14 ENCOUNTER — Encounter: Payer: Self-pay | Admitting: Nurse Practitioner

## 2015-12-14 VITALS — BP 128/82 | HR 64 | Ht 63.5 in | Wt 126.8 lb

## 2015-12-14 DIAGNOSIS — E7800 Pure hypercholesterolemia, unspecified: Secondary | ICD-10-CM

## 2015-12-14 DIAGNOSIS — M545 Low back pain: Secondary | ICD-10-CM | POA: Diagnosis not present

## 2015-12-14 DIAGNOSIS — M5030 Other cervical disc degeneration, unspecified cervical region: Secondary | ICD-10-CM | POA: Diagnosis not present

## 2015-12-14 DIAGNOSIS — M9903 Segmental and somatic dysfunction of lumbar region: Secondary | ICD-10-CM | POA: Diagnosis not present

## 2015-12-14 DIAGNOSIS — I951 Orthostatic hypotension: Secondary | ICD-10-CM

## 2015-12-14 DIAGNOSIS — I341 Nonrheumatic mitral (valve) prolapse: Secondary | ICD-10-CM | POA: Diagnosis not present

## 2015-12-14 DIAGNOSIS — M47816 Spondylosis without myelopathy or radiculopathy, lumbar region: Secondary | ICD-10-CM | POA: Diagnosis not present

## 2015-12-14 NOTE — Progress Notes (Signed)
CARDIOLOGY OFFICE NOTE  Date:  12/14/2015    Shannon Brennan Date of Birth: Mar 28, 1946 Medical Record [id number]  PCP:  Tivis Ringer, MD  Cardiologist:  Servando Snare   Chief Complaint  Patient presents with  . Cardiac Valve Problem  . Hyperlipidemia    6 month check - former patient of Dr. Mare Ferrari    History of Present Illness: Shannon Brennan is a 70 y.o. female who presents today for a follow up visit. Former patient of Dr. Sherryl Barters. She is following with me.   She has a history of MVP, HLD, and a history of hepatic hemangiomas. Last echo dates back to 2005 - had MVP with mild MR noted. She does not have known CAD.   She was last seen in February = lots of stress - had moved in with her 63 year old mother. Has had issues with tolerating statin in the past.   Comes back today. Here alone. Continues to have lots of stress with taking care of her mother. Her 2 brothers are not really helping. She takes the night shift except for Fridays. No vacation. Fortunately, she has a supportive husband. Some left shoulder pain noted - not clear where that is coming thru. Not really walking any more but when she does, BP is dropping. Does not use that much salt. Not short of breath. More fatigued.   Past Medical History:  Diagnosis Date  . Allergic rhinitis   . Arthritis    mild in right thumb  . Diverticulosis   . Family history of cardiovascular disease   . Hepatic hemangioma    history of -- followe by Dr. Cristina Gong  . Hx of osteopenia   . Hyperlipidemia    tolerates low dose Lipitor  . Hypertension    controlled with atenolol  . Insomnia   . Mitral valve prolapse    last echo in 2005 showing mild MVP with normal LV function  . Normal nuclear stress test 2006  . Palpitations   . Pyuria   . UTI (urinary tract infection)   . Vaginal atrophy     Past Surgical History:  Procedure Laterality Date  . ABDOMINAL HYSTERECTOMY    . LUMBAR LAMINECTOMY/DECOMPRESSION  MICRODISCECTOMY  01/04/2012   Procedure: LUMBAR LAMINECTOMY/DECOMPRESSION MICRODISCECTOMY 1 LEVEL;  Surgeon: Kristeen Miss, MD;  Location: [facility];  Service: Neurosurgery;  Laterality: Left;  Left Lumbar Five-Sacral One Microdiskectomy  . PARTIAL HYSTERECTOMY    . TONSILLECTOMY       Medications: Current Outpatient Prescriptions  Medication Sig Dispense Refill  . atenolol (TENORMIN) 25 MG tablet Take 0.5 tablets (12.5 mg total) by mouth daily. 45 tablet 1  . atorvastatin (LIPITOR) 10 MG tablet Take 0.5 tablets (5 mg total) by mouth 3 (three) times a week.    Marland Kitchen CALCIUM PO Take 1-2 tablets by mouth 2 (two) times daily. Bone Up. Takes 1 tablet in am and 2 tablets in pm    . Estradiol 10 MCG TABS Place 1 tablet vaginally as directed.     . NON FORMULARY ISAGENIX  Drink 1 shake daily    . Nutritional Supplements (MELATONIN PO) Take 3 mg by mouth at bedtime.     . traZODone (DESYREL) 50 MG tablet Take 75 mg by mouth daily. Take 1 and 1/2 tablets by mouth daily     No current facility-administered medications for this visit.     Allergies: Allergies  Allergen Reactions  . Erythromycin Diarrhea  . Flagyl [Metronidazole  Hcl] Nausea Only  . Levofloxacin Other (See Comments)    Headache  . Wellbutrin [Bupropion Hcl] Other (See Comments)    Dizziness  . Zoloft [Sertraline Hcl]     [employer]   . Restoril Palpitations    Social History: The patient  reports that she has never smoked. She has never used smokeless tobacco. She reports that she does not drink alcohol or use drugs.   Family History: The patient's family history includes Arthritis in her father and mother; Cancer in her father and mother; Coronary artery disease in her brother; Diabetes in her brother; Heart attack in her brother; Hyperlipidemia in her mother; Hypertension in her brother, brother, and mother; Multiple sclerosis in her brother; Osteopenia in her mother; Stroke in her mother; Ulcers in her mother.   Review of  Systems: Please see the history of present illness.   Otherwise, the review of systems is positive for none.   All other systems are reviewed and negative.   Physical Exam: VS:  BP 128/82   Pulse 64   Ht 5' 3.5" (1.613 m)   Wt 126 lb 12.8 oz (57.5 kg)   BMI 22.11 kg/m  .  BMI Body mass index is 22.11 kg/m.  Wt Readings from Last 3 Encounters:  12/14/15 126 lb 12.8 oz (57.5 kg)  06/09/15 126 lb (57.2 kg)  02/03/15 125 lb 12.8 oz (57.1 kg)    General: Pleasant. Well developed, well nourished and in no acute distress.   HEENT: Normal.  Neck: Supple, no JVD, carotid bruits, or masses noted.  Cardiac: Regular rate and rhythm. No murmurs, rubs, or gallops. No edema.  Respiratory:  Lungs are clear to auscultation bilaterally with normal work of breathing.  GI: Soft and nontender.  MS: No deformity or atrophy. Gait and ROM intact.  Skin: Warm and dry. Color is normal.  Neuro:  Strength and sensation are intact and no gross focal deficits noted.  Psych: Alert, appropriate and with normal affect.   LABORATORY DATA:  EKG:  EKG is not ordered today.  Lab Results  Component Value Date   WBC 10.3 01/03/2012   HGB 14.1 01/03/2012   HCT 42.2 01/03/2012   PLT 245 01/03/2012   GLUCOSE 91 07/14/2015   CHOL 168 07/14/2015   TRIG 90 07/14/2015   HDL 72 07/14/2015   LDLDIRECT 133.0 02/05/2013   LDLCALC 78 07/14/2015   ALT 22 07/14/2015   AST 19 07/14/2015   NA 140 07/14/2015   K 4.3 07/14/2015   CL 102 07/14/2015   CREATININE 0.85 07/14/2015   BUN 18 07/14/2015   CO2 25 07/14/2015    BNP (last 3 results) No results for input(s): BNP in the last 8760 hours.  ProBNP (last 3 results) No results for input(s): PROBNP in the last 8760 hours.   Other Studies Reviewed Today:  Study Conclusions  - Left ventricle: The cavity size was normal. Wall thickness was   normal. Systolic function was normal. The estimated ejection   fraction was in the range of 55% to 60%. Wall motion  was normal;   there were no regional wall motion abnormalities. Doppler   parameters are consistent with abnormal left ventricular   relaxation (grade 1 diastolic dysfunction). - Aortic valve: There was mild regurgitation. - Mitral valve: Moderate prolapse, involving the anterior leaflet   and the posterior leaflet. There was mild regurgitation.  Impressions:  - Prior ECHO 2005 - Mild MVP, EF 55%  Assessment/Plan: 1. Situational stress - I think  this is what is driving most of her issues. Discussed at length.   2. MVP with mild MR from prior echo - BP up some. Little short of breath. Will get her echo updated. I suspect most of her issue is centered around being the caregiver role - we discussed this at length today.   3. Palpitations - ok on low dose beta blocker. She likes to be on this to have less palpitatoins.   3. HLD - labs followed by PCP - back on Lipitor 5 mg three times a week - recent labs by Dr. Radene Gunning look good.   4. Orthostasis - noted more with exercise - some atypical shoulder/chest pain - will arrange GXT.   Current medicines are reviewed with the patient today.  The patient does not have concerns regarding medicines other than what has been noted above.  The following changes have been made:  See above.  Labs/ tests ordered today include:   No orders of the defined types were placed in this encounter.    Disposition:   FU with me in 6 months unless her GXT is abnormal.   Patient is agreeable to this plan and will call if any problems develop in the interim.   Signed: Burtis Junes, RN, ANP-C 12/14/2015 10:39 AM  [facility] [address] [place], [place]  [address] Phone: [phone] Fax: [phone]

## 2015-12-14 NOTE — Patient Instructions (Addendum)
We will be checking the following labs today - NONE   Medication Instructions:    Continue with your current medicines.     Testing/Procedures To Be Arranged:  GXT  Follow-Up:   See me tentatively in 6 months    Other Special Instructions:   Increase your salt - V8 juice daily - this should help your BP be a little higher.     If you need a refill on your cardiac medications before your next appointment, please call your pharmacy.   Call the [place] office at [phone] if you have any questions, problems or concerns.

## 2015-12-21 DIAGNOSIS — L821 Other seborrheic keratosis: Secondary | ICD-10-CM | POA: Diagnosis not present

## 2015-12-21 DIAGNOSIS — D485 Neoplasm of uncertain behavior of skin: Secondary | ICD-10-CM | POA: Diagnosis not present

## 2015-12-21 DIAGNOSIS — D2271 Melanocytic nevi of right lower limb, including hip: Secondary | ICD-10-CM | POA: Diagnosis not present

## 2015-12-21 DIAGNOSIS — D2262 Melanocytic nevi of left upper limb, including shoulder: Secondary | ICD-10-CM | POA: Diagnosis not present

## 2015-12-21 DIAGNOSIS — D2272 Melanocytic nevi of left lower limb, including hip: Secondary | ICD-10-CM | POA: Diagnosis not present

## 2015-12-21 DIAGNOSIS — L738 Other specified follicular disorders: Secondary | ICD-10-CM | POA: Diagnosis not present

## 2015-12-21 DIAGNOSIS — D225 Melanocytic nevi of trunk: Secondary | ICD-10-CM | POA: Diagnosis not present

## 2015-12-21 DIAGNOSIS — C44319 Basal cell carcinoma of skin of other parts of face: Secondary | ICD-10-CM | POA: Diagnosis not present

## 2015-12-21 DIAGNOSIS — M545 Low back pain: Secondary | ICD-10-CM | POA: Diagnosis not present

## 2015-12-21 DIAGNOSIS — M5030 Other cervical disc degeneration, unspecified cervical region: Secondary | ICD-10-CM | POA: Diagnosis not present

## 2015-12-21 DIAGNOSIS — M47816 Spondylosis without myelopathy or radiculopathy, lumbar region: Secondary | ICD-10-CM | POA: Diagnosis not present

## 2015-12-21 DIAGNOSIS — M9903 Segmental and somatic dysfunction of lumbar region: Secondary | ICD-10-CM | POA: Diagnosis not present

## 2015-12-21 DIAGNOSIS — L820 Inflamed seborrheic keratosis: Secondary | ICD-10-CM | POA: Diagnosis not present

## 2015-12-28 ENCOUNTER — Ambulatory Visit (INDEPENDENT_AMBULATORY_CARE_PROVIDER_SITE_OTHER): Payer: Medicare Other

## 2015-12-28 DIAGNOSIS — I951 Orthostatic hypotension: Secondary | ICD-10-CM | POA: Diagnosis not present

## 2015-12-28 DIAGNOSIS — I341 Nonrheumatic mitral (valve) prolapse: Secondary | ICD-10-CM | POA: Diagnosis not present

## 2015-12-28 DIAGNOSIS — E7800 Pure hypercholesterolemia, unspecified: Secondary | ICD-10-CM | POA: Diagnosis not present

## 2015-12-28 LAB — EXERCISE TOLERANCE TEST
Estimated workload: 7 METS
Exercise duration (min): 6 min
Exercise duration (sec): 0 s
MPHR: 149 {beats}/min
Peak HR: 146 {beats}/min
Percent HR: 97 %
RPE: 15
Rest HR: 83 {beats}/min

## 2016-01-04 DIAGNOSIS — M5030 Other cervical disc degeneration, unspecified cervical region: Secondary | ICD-10-CM | POA: Diagnosis not present

## 2016-01-04 DIAGNOSIS — M47816 Spondylosis without myelopathy or radiculopathy, lumbar region: Secondary | ICD-10-CM | POA: Diagnosis not present

## 2016-01-04 DIAGNOSIS — M545 Low back pain: Secondary | ICD-10-CM | POA: Diagnosis not present

## 2016-01-04 DIAGNOSIS — M9903 Segmental and somatic dysfunction of lumbar region: Secondary | ICD-10-CM | POA: Diagnosis not present

## 2016-01-26 DIAGNOSIS — C44319 Basal cell carcinoma of skin of other parts of face: Secondary | ICD-10-CM | POA: Diagnosis not present

## 2016-01-26 DIAGNOSIS — Z85828 Personal history of other malignant neoplasm of skin: Secondary | ICD-10-CM | POA: Diagnosis not present

## 2016-02-15 DIAGNOSIS — R300 Dysuria: Secondary | ICD-10-CM | POA: Diagnosis not present

## 2016-02-15 DIAGNOSIS — N390 Urinary tract infection, site not specified: Secondary | ICD-10-CM | POA: Diagnosis not present

## 2016-03-28 DIAGNOSIS — L570 Actinic keratosis: Secondary | ICD-10-CM | POA: Diagnosis not present

## 2016-03-28 DIAGNOSIS — Z85828 Personal history of other malignant neoplasm of skin: Secondary | ICD-10-CM | POA: Diagnosis not present

## 2016-04-05 ENCOUNTER — Other Ambulatory Visit: Payer: Self-pay | Admitting: Nurse Practitioner

## 2016-05-22 NOTE — Progress Notes (Signed)
CARDIOLOGY OFFICE NOTE  Date:  05/23/2016    ROSCHELLE Brennan Date of Birth: [birth year] Medical Record [id number]  PCP:  Tivis Ringer, MD  Cardiologist:  Servando Snare   Chief Complaint  Patient presents with  . Palpitations    5 month check     History of Present Illness: Shannon Brennan is a 71 y.o. female who presents today for a 5 month check. Former patient of Dr. Sherryl Barters. She is following with me.   She has a history of MVP, palpitations, HLD, and a history of hepatic hemangiomas. Last echo dates back to 2017 - had MVP with mild MR noted. She does not have known CAD.   Seen last February by me - lots of stress - had moved in with her 73 year old mother. Has had issues with tolerating statin in the past. Last seen back in August - had gotten some rest. Had gotten her other siblings to start helping more with their mom. GXT obtained due to orthostasis with exercise - this turned out ok.   Comes back today. Here alone. Mom is now 39. Michielle has gotten some weekend help but one of her brothers has died back in the fall. Her other sibling is really not helpful in the way that she needs it. She is not getting enough sleep. No chest pain. Some palpitations but in the setting of more stress. Breathing is ok. No swelling. BP is ok. She feels like she is doing ok from our standpoint. Having her physical in the next few weeks with Dr. Dagmar Hait.   Past Medical History:  Diagnosis Date  . Allergic rhinitis   . Arthritis    mild in right thumb  . Diverticulosis   . Family history of cardiovascular disease   . Hepatic hemangioma    history of -- followe by Dr. Cristina Gong  . Hx of osteopenia   . Hyperlipidemia    tolerates low dose Lipitor  . Hypertension    controlled with atenolol  . Insomnia   . Mitral valve prolapse    last echo in 2005 showing mild MVP with normal LV function  . Normal nuclear stress test 2006  . Palpitations   . Pyuria   . UTI (urinary tract infection)     . Vaginal atrophy     Past Surgical History:  Procedure Laterality Date  . ABDOMINAL HYSTERECTOMY    . LUMBAR LAMINECTOMY/DECOMPRESSION MICRODISCECTOMY  01/04/2012   Procedure: LUMBAR LAMINECTOMY/DECOMPRESSION MICRODISCECTOMY 1 LEVEL;  Surgeon: Kristeen Miss, MD;  Location: [facility];  Service: Neurosurgery;  Laterality: Left;  Left Lumbar Five-Sacral One Microdiskectomy  . PARTIAL HYSTERECTOMY    . TONSILLECTOMY       Medications: Current Outpatient Prescriptions  Medication Sig Dispense Refill  . atenolol (TENORMIN) 25 MG tablet Take 0.5 tablets (12.5 mg total) by mouth daily. 45 tablet 2  . atorvastatin (LIPITOR) 10 MG tablet Take 0.5 tablets (5 mg total) by mouth 3 (three) times a week.    Marland Kitchen CALCIUM PO Take 1-2 tablets by mouth 2 (two) times daily. Bone Up. Takes 1 tablet in am and 2 tablets in pm    . Estradiol 10 MCG TABS Place 1 tablet vaginally as directed.     . NON FORMULARY ISAGENIX  Drink 1 shake daily    . Nutritional Supplements (MELATONIN PO) Take 3 mg by mouth at bedtime.     . traZODone (DESYREL) 50 MG tablet Take 75 mg by mouth daily. Take  1 and 1/2 tablets by mouth daily     No current facility-administered medications for this visit.     Allergies: Allergies  Allergen Reactions  . Erythromycin Diarrhea  . Flagyl [Metronidazole Hcl] Nausea Only  . Levofloxacin Other (See Comments)    Headache  . Wellbutrin [Bupropion Hcl] Other (See Comments)    Dizziness  . Zoloft [Sertraline Hcl]     [employer]   . Restoril Palpitations    Social History: The patient  reports that she has never smoked. She has never used smokeless tobacco. She reports that she does not drink alcohol or use drugs.   Family History: The patient's family history includes Arthritis in her father and mother; Cancer in her father and mother; Coronary artery disease in her brother; Diabetes in her brother; Heart attack in her brother; Hyperlipidemia in her mother; Hypertension in her  brother, brother, and mother; Multiple sclerosis in her brother; Osteopenia in her mother; Stroke in her mother; Ulcers in her mother.   Review of Systems: Please see the history of present illness.   Otherwise, the review of systems is positive for none.   All other systems are reviewed and negative.   Physical Exam: VS:  BP 120/78   Pulse 65   Ht 5\' 4"  (1.626 m)   Wt 125 lb 9.6 oz (57 kg)   SpO2 100% Comment: at rest  BMI 21.56 kg/m  .  BMI Body mass index is 21.56 kg/m.  Wt Readings from Last 3 Encounters:  05/23/16 125 lb 9.6 oz (57 kg)  12/14/15 126 lb 12.8 oz (57.5 kg)  06/09/15 126 lb (57.2 kg)    General: Pleasant. She is thin. She is alert and in no acute distress.   HEENT: Normal.  Neck: Supple, no JVD, carotid bruits, or masses noted.  Cardiac: Regular rate and rhythm. Systolic murmur noted. No edema.  Respiratory:  Lungs are clear to auscultation bilaterally with normal work of breathing.  GI: Soft and nontender.  MS: No deformity or atrophy. Gait and ROM intact.  Skin: Warm and dry. Color is normal.  Neuro:  Strength and sensation are intact and no gross focal deficits noted.  Psych: Alert, appropriate and with normal affect.   LABORATORY DATA:  EKG:  EKG is not ordered today..  Lab Results  Component Value Date   WBC 10.3 01/03/2012   HGB 14.1 01/03/2012   HCT 42.2 01/03/2012   PLT 245 01/03/2012   GLUCOSE 91 07/14/2015   CHOL 168 07/14/2015   TRIG 90 07/14/2015   HDL 72 07/14/2015   LDLDIRECT 133.0 02/05/2013   LDLCALC 78 07/14/2015   ALT 22 07/14/2015   AST 19 07/14/2015   NA 140 07/14/2015   K 4.3 07/14/2015   CL 102 07/14/2015   CREATININE 0.85 07/14/2015   BUN 18 07/14/2015   CO2 25 07/14/2015    BNP (last 3 results) No results for input(s): BNP in the last 8760 hours.  ProBNP (last 3 results) No results for input(s): PROBNP in the last 8760 hours.   Other Studies Reviewed Today:  GXT Study Highlights from 11/2015    Negative  GXT  The patient walked for 6 minutes on a Bruce protocol GXT. The peak H R was 149 which is 97% predicted maximal HR . There were no ST or T wave changes to suggest ischemia.  Normal BP response   The patient walked for a total of 6 minutes on a Bruce protocol GXT.   The patient  achieved a peak HR of 149 which is 97%  predicted maximal HR.   There were no ST changes to suggest ischemia.   The BP response to exercise was normal.        Echo Study Conclusions 06/2015  - Left ventricle: The cavity size was normal. Wall thickness was normal. Systolic function was normal. The estimated ejection fraction was in the range of 55% to 60%. Wall motion was normal; there were no regional wall motion abnormalities. Doppler parameters are consistent with abnormal left ventricular relaxation (grade 1 diastolic dysfunction). - Aortic valve: There was mild regurgitation. - Mitral valve: Moderate prolapse, involving the anterior leaflet and the posterior leaflet. There was mild regurgitation.  Impressions:  - Prior ECHO 2005 - Mild MVP, EF 55%  Assessment/Plan: 1. Situational stress - I think this is what is driving most of her issues. Discussed at length again today and tried to be encouraging.   2. MVP with mild MR from prior echo - BP stable. Would follow for now - consider repeat echo in 2019.  3. Palpitations - ok on low dose beta blocker. She likes to be on this to have less palpitations.   3. HLD - labs followed by PCP - back on Lipitor 5 mg three times a week - having labs with Dr. Radene Gunning later this week.  4. Orthostasis - seems resolved.    Current medicines are reviewed with the patient today.  The patient does not have concerns regarding medicines other than what has been noted above.  The following changes have been made:  See above.  Labs/ tests ordered today include:   No orders of the defined types were placed in this encounter.    Disposition:   FU with  me in 6 months.   Patient is agreeable to this plan and will call if any problems develop in the interim.   SignedTruitt Merle, NP  05/23/2016 9:42 AM  [facility] [address] [place], [place]  [address] Phone: [phone] Fax: [phone]

## 2016-05-23 ENCOUNTER — Encounter: Payer: Self-pay | Admitting: Nurse Practitioner

## 2016-05-23 ENCOUNTER — Encounter (INDEPENDENT_AMBULATORY_CARE_PROVIDER_SITE_OTHER): Payer: Self-pay

## 2016-05-23 ENCOUNTER — Ambulatory Visit (INDEPENDENT_AMBULATORY_CARE_PROVIDER_SITE_OTHER): Payer: Medicare Other | Admitting: Nurse Practitioner

## 2016-05-23 VITALS — BP 120/78 | HR 65 | Ht 64.0 in | Wt 125.6 lb

## 2016-05-23 DIAGNOSIS — I341 Nonrheumatic mitral (valve) prolapse: Secondary | ICD-10-CM

## 2016-05-23 DIAGNOSIS — R002 Palpitations: Secondary | ICD-10-CM | POA: Diagnosis not present

## 2016-05-23 DIAGNOSIS — E7800 Pure hypercholesterolemia, unspecified: Secondary | ICD-10-CM

## 2016-05-23 NOTE — Patient Instructions (Addendum)
We will be checking the following labs today - NONE   Medication Instructions:    Continue with your current medicines.     Testing/Procedures To Be Arranged:  N/A  Follow-Up:   See me in 6 months.    Other Special Instructions:   N/A    If you need a refill on your cardiac medications before your next appointment, please call your pharmacy.   Call the [facility] office at [phone] if you have any questions, problems or concerns.      

## 2016-05-25 DIAGNOSIS — E784 Other hyperlipidemia: Secondary | ICD-10-CM | POA: Diagnosis not present

## 2016-05-25 DIAGNOSIS — M810 Age-related osteoporosis without current pathological fracture: Secondary | ICD-10-CM | POA: Diagnosis not present

## 2016-06-01 DIAGNOSIS — E784 Other hyperlipidemia: Secondary | ICD-10-CM | POA: Diagnosis not present

## 2016-06-01 DIAGNOSIS — I341 Nonrheumatic mitral (valve) prolapse: Secondary | ICD-10-CM | POA: Diagnosis not present

## 2016-06-01 DIAGNOSIS — I519 Heart disease, unspecified: Secondary | ICD-10-CM | POA: Diagnosis not present

## 2016-06-01 DIAGNOSIS — M47816 Spondylosis without myelopathy or radiculopathy, lumbar region: Secondary | ICD-10-CM | POA: Diagnosis not present

## 2016-06-01 DIAGNOSIS — Z0000 Encounter for general adult medical examination without abnormal findings: Secondary | ICD-10-CM | POA: Diagnosis not present

## 2016-06-01 DIAGNOSIS — M5442 Lumbago with sciatica, left side: Secondary | ICD-10-CM | POA: Diagnosis not present

## 2016-06-01 DIAGNOSIS — M5136 Other intervertebral disc degeneration, lumbar region: Secondary | ICD-10-CM | POA: Diagnosis not present

## 2016-06-01 DIAGNOSIS — M9903 Segmental and somatic dysfunction of lumbar region: Secondary | ICD-10-CM | POA: Diagnosis not present

## 2016-06-01 DIAGNOSIS — N390 Urinary tract infection, site not specified: Secondary | ICD-10-CM | POA: Diagnosis not present

## 2016-06-06 ENCOUNTER — Ambulatory Visit: Payer: Medicare Other | Admitting: Nurse Practitioner

## 2016-06-06 DIAGNOSIS — Z1212 Encounter for screening for malignant neoplasm of rectum: Secondary | ICD-10-CM | POA: Diagnosis not present

## 2016-06-07 DIAGNOSIS — M5442 Lumbago with sciatica, left side: Secondary | ICD-10-CM | POA: Diagnosis not present

## 2016-06-07 DIAGNOSIS — M47816 Spondylosis without myelopathy or radiculopathy, lumbar region: Secondary | ICD-10-CM | POA: Diagnosis not present

## 2016-06-07 DIAGNOSIS — M5136 Other intervertebral disc degeneration, lumbar region: Secondary | ICD-10-CM | POA: Diagnosis not present

## 2016-06-07 DIAGNOSIS — M9903 Segmental and somatic dysfunction of lumbar region: Secondary | ICD-10-CM | POA: Diagnosis not present

## 2016-06-20 DIAGNOSIS — M5136 Other intervertebral disc degeneration, lumbar region: Secondary | ICD-10-CM | POA: Diagnosis not present

## 2016-06-20 DIAGNOSIS — M5442 Lumbago with sciatica, left side: Secondary | ICD-10-CM | POA: Diagnosis not present

## 2016-06-20 DIAGNOSIS — M9903 Segmental and somatic dysfunction of lumbar region: Secondary | ICD-10-CM | POA: Diagnosis not present

## 2016-06-20 DIAGNOSIS — M47816 Spondylosis without myelopathy or radiculopathy, lumbar region: Secondary | ICD-10-CM | POA: Diagnosis not present

## 2016-06-22 DIAGNOSIS — M5442 Lumbago with sciatica, left side: Secondary | ICD-10-CM | POA: Diagnosis not present

## 2016-06-22 DIAGNOSIS — M5136 Other intervertebral disc degeneration, lumbar region: Secondary | ICD-10-CM | POA: Diagnosis not present

## 2016-06-22 DIAGNOSIS — M47816 Spondylosis without myelopathy or radiculopathy, lumbar region: Secondary | ICD-10-CM | POA: Diagnosis not present

## 2016-06-22 DIAGNOSIS — M9903 Segmental and somatic dysfunction of lumbar region: Secondary | ICD-10-CM | POA: Diagnosis not present

## 2016-06-28 DIAGNOSIS — M5442 Lumbago with sciatica, left side: Secondary | ICD-10-CM | POA: Diagnosis not present

## 2016-06-28 DIAGNOSIS — M9903 Segmental and somatic dysfunction of lumbar region: Secondary | ICD-10-CM | POA: Diagnosis not present

## 2016-06-28 DIAGNOSIS — M5136 Other intervertebral disc degeneration, lumbar region: Secondary | ICD-10-CM | POA: Diagnosis not present

## 2016-06-28 DIAGNOSIS — M47816 Spondylosis without myelopathy or radiculopathy, lumbar region: Secondary | ICD-10-CM | POA: Diagnosis not present

## 2016-07-05 DIAGNOSIS — M9903 Segmental and somatic dysfunction of lumbar region: Secondary | ICD-10-CM | POA: Diagnosis not present

## 2016-07-05 DIAGNOSIS — M5136 Other intervertebral disc degeneration, lumbar region: Secondary | ICD-10-CM | POA: Diagnosis not present

## 2016-07-05 DIAGNOSIS — M47816 Spondylosis without myelopathy or radiculopathy, lumbar region: Secondary | ICD-10-CM | POA: Diagnosis not present

## 2016-07-05 DIAGNOSIS — M5442 Lumbago with sciatica, left side: Secondary | ICD-10-CM | POA: Diagnosis not present

## 2016-07-12 DIAGNOSIS — M47816 Spondylosis without myelopathy or radiculopathy, lumbar region: Secondary | ICD-10-CM | POA: Diagnosis not present

## 2016-07-12 DIAGNOSIS — M9903 Segmental and somatic dysfunction of lumbar region: Secondary | ICD-10-CM | POA: Diagnosis not present

## 2016-07-12 DIAGNOSIS — M5442 Lumbago with sciatica, left side: Secondary | ICD-10-CM | POA: Diagnosis not present

## 2016-07-12 DIAGNOSIS — M5136 Other intervertebral disc degeneration, lumbar region: Secondary | ICD-10-CM | POA: Diagnosis not present

## 2016-07-18 DIAGNOSIS — M5136 Other intervertebral disc degeneration, lumbar region: Secondary | ICD-10-CM | POA: Diagnosis not present

## 2016-07-18 DIAGNOSIS — M5442 Lumbago with sciatica, left side: Secondary | ICD-10-CM | POA: Diagnosis not present

## 2016-07-18 DIAGNOSIS — M9903 Segmental and somatic dysfunction of lumbar region: Secondary | ICD-10-CM | POA: Diagnosis not present

## 2016-07-18 DIAGNOSIS — M47816 Spondylosis without myelopathy or radiculopathy, lumbar region: Secondary | ICD-10-CM | POA: Diagnosis not present

## 2016-07-26 DIAGNOSIS — M9903 Segmental and somatic dysfunction of lumbar region: Secondary | ICD-10-CM | POA: Diagnosis not present

## 2016-07-26 DIAGNOSIS — M5442 Lumbago with sciatica, left side: Secondary | ICD-10-CM | POA: Diagnosis not present

## 2016-07-26 DIAGNOSIS — M47816 Spondylosis without myelopathy or radiculopathy, lumbar region: Secondary | ICD-10-CM | POA: Diagnosis not present

## 2016-07-26 DIAGNOSIS — M5136 Other intervertebral disc degeneration, lumbar region: Secondary | ICD-10-CM | POA: Diagnosis not present

## 2016-08-03 DIAGNOSIS — M47816 Spondylosis without myelopathy or radiculopathy, lumbar region: Secondary | ICD-10-CM | POA: Diagnosis not present

## 2016-08-03 DIAGNOSIS — M5136 Other intervertebral disc degeneration, lumbar region: Secondary | ICD-10-CM | POA: Diagnosis not present

## 2016-08-03 DIAGNOSIS — M5442 Lumbago with sciatica, left side: Secondary | ICD-10-CM | POA: Diagnosis not present

## 2016-08-03 DIAGNOSIS — M9903 Segmental and somatic dysfunction of lumbar region: Secondary | ICD-10-CM | POA: Diagnosis not present

## 2016-08-10 DIAGNOSIS — M9903 Segmental and somatic dysfunction of lumbar region: Secondary | ICD-10-CM | POA: Diagnosis not present

## 2016-08-10 DIAGNOSIS — M5136 Other intervertebral disc degeneration, lumbar region: Secondary | ICD-10-CM | POA: Diagnosis not present

## 2016-08-10 DIAGNOSIS — M47816 Spondylosis without myelopathy or radiculopathy, lumbar region: Secondary | ICD-10-CM | POA: Diagnosis not present

## 2016-08-10 DIAGNOSIS — M5442 Lumbago with sciatica, left side: Secondary | ICD-10-CM | POA: Diagnosis not present

## 2016-08-16 DIAGNOSIS — M9903 Segmental and somatic dysfunction of lumbar region: Secondary | ICD-10-CM | POA: Diagnosis not present

## 2016-08-16 DIAGNOSIS — M5136 Other intervertebral disc degeneration, lumbar region: Secondary | ICD-10-CM | POA: Diagnosis not present

## 2016-08-16 DIAGNOSIS — M5442 Lumbago with sciatica, left side: Secondary | ICD-10-CM | POA: Diagnosis not present

## 2016-08-16 DIAGNOSIS — M47816 Spondylosis without myelopathy or radiculopathy, lumbar region: Secondary | ICD-10-CM | POA: Diagnosis not present

## 2016-08-23 DIAGNOSIS — M9903 Segmental and somatic dysfunction of lumbar region: Secondary | ICD-10-CM | POA: Diagnosis not present

## 2016-08-23 DIAGNOSIS — M5442 Lumbago with sciatica, left side: Secondary | ICD-10-CM | POA: Diagnosis not present

## 2016-08-23 DIAGNOSIS — M5136 Other intervertebral disc degeneration, lumbar region: Secondary | ICD-10-CM | POA: Diagnosis not present

## 2016-08-23 DIAGNOSIS — M47816 Spondylosis without myelopathy or radiculopathy, lumbar region: Secondary | ICD-10-CM | POA: Diagnosis not present

## 2016-08-30 DIAGNOSIS — M5442 Lumbago with sciatica, left side: Secondary | ICD-10-CM | POA: Diagnosis not present

## 2016-08-30 DIAGNOSIS — M9903 Segmental and somatic dysfunction of lumbar region: Secondary | ICD-10-CM | POA: Diagnosis not present

## 2016-08-30 DIAGNOSIS — M47816 Spondylosis without myelopathy or radiculopathy, lumbar region: Secondary | ICD-10-CM | POA: Diagnosis not present

## 2016-08-30 DIAGNOSIS — M5136 Other intervertebral disc degeneration, lumbar region: Secondary | ICD-10-CM | POA: Diagnosis not present

## 2016-09-06 DIAGNOSIS — M5013 Cervical disc disorder with radiculopathy, cervicothoracic region: Secondary | ICD-10-CM | POA: Diagnosis not present

## 2016-09-06 DIAGNOSIS — M5413 Radiculopathy, cervicothoracic region: Secondary | ICD-10-CM | POA: Diagnosis not present

## 2016-09-06 DIAGNOSIS — M9901 Segmental and somatic dysfunction of cervical region: Secondary | ICD-10-CM | POA: Diagnosis not present

## 2016-09-06 DIAGNOSIS — M542 Cervicalgia: Secondary | ICD-10-CM | POA: Diagnosis not present

## 2016-09-11 DIAGNOSIS — H524 Presbyopia: Secondary | ICD-10-CM | POA: Diagnosis not present

## 2016-09-13 DIAGNOSIS — M9901 Segmental and somatic dysfunction of cervical region: Secondary | ICD-10-CM | POA: Diagnosis not present

## 2016-09-13 DIAGNOSIS — M5413 Radiculopathy, cervicothoracic region: Secondary | ICD-10-CM | POA: Diagnosis not present

## 2016-09-13 DIAGNOSIS — M542 Cervicalgia: Secondary | ICD-10-CM | POA: Diagnosis not present

## 2016-09-13 DIAGNOSIS — M5013 Cervical disc disorder with radiculopathy, cervicothoracic region: Secondary | ICD-10-CM | POA: Diagnosis not present

## 2016-09-19 DIAGNOSIS — H25013 Cortical age-related cataract, bilateral: Secondary | ICD-10-CM | POA: Diagnosis not present

## 2016-09-19 DIAGNOSIS — H25043 Posterior subcapsular polar age-related cataract, bilateral: Secondary | ICD-10-CM | POA: Diagnosis not present

## 2016-09-19 DIAGNOSIS — H2513 Age-related nuclear cataract, bilateral: Secondary | ICD-10-CM | POA: Diagnosis not present

## 2016-09-19 DIAGNOSIS — H02839 Dermatochalasis of unspecified eye, unspecified eyelid: Secondary | ICD-10-CM | POA: Diagnosis not present

## 2016-09-20 DIAGNOSIS — M5013 Cervical disc disorder with radiculopathy, cervicothoracic region: Secondary | ICD-10-CM | POA: Diagnosis not present

## 2016-09-20 DIAGNOSIS — M542 Cervicalgia: Secondary | ICD-10-CM | POA: Diagnosis not present

## 2016-09-20 DIAGNOSIS — M9901 Segmental and somatic dysfunction of cervical region: Secondary | ICD-10-CM | POA: Diagnosis not present

## 2016-09-20 DIAGNOSIS — M5413 Radiculopathy, cervicothoracic region: Secondary | ICD-10-CM | POA: Diagnosis not present

## 2016-10-04 DIAGNOSIS — M5013 Cervical disc disorder with radiculopathy, cervicothoracic region: Secondary | ICD-10-CM | POA: Diagnosis not present

## 2016-10-04 DIAGNOSIS — M542 Cervicalgia: Secondary | ICD-10-CM | POA: Diagnosis not present

## 2016-10-04 DIAGNOSIS — M5413 Radiculopathy, cervicothoracic region: Secondary | ICD-10-CM | POA: Diagnosis not present

## 2016-10-04 DIAGNOSIS — M9901 Segmental and somatic dysfunction of cervical region: Secondary | ICD-10-CM | POA: Diagnosis not present

## 2016-10-06 DIAGNOSIS — Z1231 Encounter for screening mammogram for malignant neoplasm of breast: Secondary | ICD-10-CM | POA: Diagnosis not present

## 2016-10-09 DIAGNOSIS — M5013 Cervical disc disorder with radiculopathy, cervicothoracic region: Secondary | ICD-10-CM | POA: Diagnosis not present

## 2016-10-09 DIAGNOSIS — M542 Cervicalgia: Secondary | ICD-10-CM | POA: Diagnosis not present

## 2016-10-09 DIAGNOSIS — M9901 Segmental and somatic dysfunction of cervical region: Secondary | ICD-10-CM | POA: Diagnosis not present

## 2016-10-09 DIAGNOSIS — M5413 Radiculopathy, cervicothoracic region: Secondary | ICD-10-CM | POA: Diagnosis not present

## 2016-10-12 DIAGNOSIS — M9901 Segmental and somatic dysfunction of cervical region: Secondary | ICD-10-CM | POA: Diagnosis not present

## 2016-10-12 DIAGNOSIS — M5013 Cervical disc disorder with radiculopathy, cervicothoracic region: Secondary | ICD-10-CM | POA: Diagnosis not present

## 2016-10-12 DIAGNOSIS — M5413 Radiculopathy, cervicothoracic region: Secondary | ICD-10-CM | POA: Diagnosis not present

## 2016-10-12 DIAGNOSIS — M542 Cervicalgia: Secondary | ICD-10-CM | POA: Diagnosis not present

## 2016-10-19 DIAGNOSIS — M9901 Segmental and somatic dysfunction of cervical region: Secondary | ICD-10-CM | POA: Diagnosis not present

## 2016-10-19 DIAGNOSIS — M542 Cervicalgia: Secondary | ICD-10-CM | POA: Diagnosis not present

## 2016-10-19 DIAGNOSIS — M5413 Radiculopathy, cervicothoracic region: Secondary | ICD-10-CM | POA: Diagnosis not present

## 2016-10-19 DIAGNOSIS — M5013 Cervical disc disorder with radiculopathy, cervicothoracic region: Secondary | ICD-10-CM | POA: Diagnosis not present

## 2016-10-20 ENCOUNTER — Encounter: Payer: Self-pay | Admitting: Nurse Practitioner

## 2016-10-24 DIAGNOSIS — M5413 Radiculopathy, cervicothoracic region: Secondary | ICD-10-CM | POA: Diagnosis not present

## 2016-10-24 DIAGNOSIS — M5013 Cervical disc disorder with radiculopathy, cervicothoracic region: Secondary | ICD-10-CM | POA: Diagnosis not present

## 2016-10-24 DIAGNOSIS — M542 Cervicalgia: Secondary | ICD-10-CM | POA: Diagnosis not present

## 2016-10-24 DIAGNOSIS — M9901 Segmental and somatic dysfunction of cervical region: Secondary | ICD-10-CM | POA: Diagnosis not present

## 2016-10-31 DIAGNOSIS — M5013 Cervical disc disorder with radiculopathy, cervicothoracic region: Secondary | ICD-10-CM | POA: Diagnosis not present

## 2016-10-31 DIAGNOSIS — M9901 Segmental and somatic dysfunction of cervical region: Secondary | ICD-10-CM | POA: Diagnosis not present

## 2016-10-31 DIAGNOSIS — M542 Cervicalgia: Secondary | ICD-10-CM | POA: Diagnosis not present

## 2016-10-31 DIAGNOSIS — M5413 Radiculopathy, cervicothoracic region: Secondary | ICD-10-CM | POA: Diagnosis not present

## 2016-11-02 DIAGNOSIS — M5013 Cervical disc disorder with radiculopathy, cervicothoracic region: Secondary | ICD-10-CM | POA: Diagnosis not present

## 2016-11-02 DIAGNOSIS — M5413 Radiculopathy, cervicothoracic region: Secondary | ICD-10-CM | POA: Diagnosis not present

## 2016-11-02 DIAGNOSIS — M9901 Segmental and somatic dysfunction of cervical region: Secondary | ICD-10-CM | POA: Diagnosis not present

## 2016-11-02 DIAGNOSIS — M542 Cervicalgia: Secondary | ICD-10-CM | POA: Diagnosis not present

## 2016-11-07 ENCOUNTER — Ambulatory Visit (INDEPENDENT_AMBULATORY_CARE_PROVIDER_SITE_OTHER): Payer: Medicare Other | Admitting: Nurse Practitioner

## 2016-11-07 ENCOUNTER — Encounter (INDEPENDENT_AMBULATORY_CARE_PROVIDER_SITE_OTHER): Payer: Self-pay

## 2016-11-07 ENCOUNTER — Encounter: Payer: Self-pay | Admitting: Nurse Practitioner

## 2016-11-07 VITALS — BP 122/60 | HR 61 | Ht 64.0 in | Wt 124.8 lb

## 2016-11-07 DIAGNOSIS — R002 Palpitations: Secondary | ICD-10-CM

## 2016-11-07 DIAGNOSIS — I341 Nonrheumatic mitral (valve) prolapse: Secondary | ICD-10-CM | POA: Diagnosis not present

## 2016-11-07 DIAGNOSIS — E7800 Pure hypercholesterolemia, unspecified: Secondary | ICD-10-CM | POA: Diagnosis not present

## 2016-11-07 NOTE — Progress Notes (Signed)
CARDIOLOGY OFFICE NOTE  Date:  11/07/2016    Shannon Brennan Date of Birth: Dec 24, 1945 Medical Record #[id number]  PCP:  Prince Solian, MD  Cardiologist:  Servando Snare     Chief Complaint  Patient presents with  . Palpitations    6 month check     History of Present Illness: Shannon Brennan is a 71 y.o. female who presents today for a follow up visit. Former patient of Dr. Sherryl Barters. She is following with me.   She has a history of MVP, palpitations, HLD, and a history of hepatic hemangiomas. Last echo dates back to 2017 - had MVP with mild MR noted. She does not have known CAD.   Seen by me last February of 2017 - lots of stress - had moved in with her 6 year old mother. Has had issues with tolerating statin in the past. When seen back in August - had gotten some rest and had gotten her other siblings to start helping more with their mom - but this did not last. GXT obtained due to orthostasis with exercise - this turned out ok. Last seen back in January and she was doing ok - still with lots of stress - her mom was 17. Her brother had died.   Comes back today. Here alone. She is doing ok from our standpoint. Mom was hospitalized back in February - had aspiration pneumonia - thought she was going to die - had aspirated on reflux - but she has rallied back. Still lots of angst with her care. She is doing ok. No chest pain. Palpitations are stable. Not getting enough rest. But overall she seems to be ok.   Past Medical History:  Diagnosis Date  . Allergic rhinitis   . Arthritis    mild in right thumb  . Diverticulosis   . Family history of cardiovascular disease   . Hepatic hemangioma    history of -- followe by Dr. Cristina Gong  . Hx of osteopenia   . Hyperlipidemia    tolerates low dose Lipitor  . Hypertension    controlled with atenolol  . Insomnia   . Mitral valve prolapse    last echo in 2005 showing mild MVP with normal LV function  . Normal nuclear stress test  2006  . Palpitations   . Pyuria   . UTI (urinary tract infection)   . Vaginal atrophy     Past Surgical History:  Procedure Laterality Date  . ABDOMINAL HYSTERECTOMY    . LUMBAR LAMINECTOMY/DECOMPRESSION MICRODISCECTOMY  01/04/2012   Procedure: LUMBAR LAMINECTOMY/DECOMPRESSION MICRODISCECTOMY 1 LEVEL;  Surgeon: Kristeen Miss, MD;  Location: [facility];  Service: Neurosurgery;  Laterality: Left;  Left Lumbar Five-Sacral One Microdiskectomy  . PARTIAL HYSTERECTOMY    . TONSILLECTOMY       Medications: Current Meds  Medication Sig  . atenolol (TENORMIN) 25 MG tablet Take 0.5 tablets (12.5 mg total) by mouth daily.  Marland Kitchen atorvastatin (LIPITOR) 10 MG tablet Take 0.5 tablets (5 mg total) by mouth 3 (three) times a week.  Marland Kitchen CALCIUM PO Take 1-2 tablets by mouth 2 (two) times daily. Bone Up. Takes 1 tablet in am and 2 tablets in pm  . Estradiol 10 MCG TABS Place 1 tablet vaginally as directed.   . NON FORMULARY ISAGENIX  Drink 1 shake daily  . Nutritional Supplements (MELATONIN PO) Take 3 mg by mouth at bedtime.   . traZODone (DESYREL) 50 MG tablet Take 75 mg by mouth daily. Take  1 and 1/2 tablets by mouth daily     Allergies: Allergies  Allergen Reactions  . Erythromycin Diarrhea  . Flagyl [Metronidazole Hcl] Nausea Only  . Levofloxacin Other (See Comments)    Headache  . Wellbutrin [Bupropion Hcl] Other (See Comments)    Dizziness  . Zoloft [Sertraline Hcl]     [employer]   . Restoril Palpitations    Social History: The patient  reports that she has never smoked. She has never used smokeless tobacco. She reports that she does not drink alcohol or use drugs.   Family History: The patient's family history includes Arthritis in her father and mother; Cancer in her father and mother; Coronary artery disease in her brother; Diabetes in her brother; Heart attack in her brother; Hyperlipidemia in her mother; Hypertension in her brother, brother, and mother; Multiple sclerosis in her  brother; Osteopenia in her mother; Stroke in her mother; Ulcers in her mother.    Review of Systems: Please see the history of present illness.   Otherwise, the review of systems is positive for none.   All other systems are reviewed and negative.   Physical Exam: VS:  BP 122/60 (BP Location: Left Arm, Patient Position: Sitting, Cuff Size: Normal)   Pulse 61   Ht 5\' 4"  (1.626 m)   Wt 124 lb 12.8 oz (56.6 kg)   BMI 21.42 kg/m  .  BMI Body mass index is 21.42 kg/m.  Wt Readings from Last 3 Encounters:  11/07/16 124 lb 12.8 oz (56.6 kg)  05/23/16 125 lb 9.6 oz (57 kg)  12/14/15 126 lb 12.8 oz (57.5 kg)    General: Pleasant. Well developed, well nourished and in no acute distress.   HEENT: Normal.  Neck: Supple, no JVD, carotid bruits, or masses noted.  Cardiac: Regular rate and rhythm. No murmurs, rubs, or gallops. No edema.  Respiratory:  Lungs are clear to auscultation bilaterally with normal work of breathing.  GI: Soft and nontender.  MS: No deformity or atrophy. Gait and ROM intact.  Skin: Warm and dry. Color is normal.  Neuro:  Strength and sensation are intact and no gross focal deficits noted.  Psych: Alert, appropriate and with normal affect.   LABORATORY DATA:  EKG:  EKG is ordered today. This shows NSR.   Lab Results  Component Value Date   WBC 10.3 01/03/2012   HGB 14.1 01/03/2012   HCT 42.2 01/03/2012   PLT 245 01/03/2012   GLUCOSE 91 07/14/2015   CHOL 168 07/14/2015   TRIG 90 07/14/2015   HDL 72 07/14/2015   LDLDIRECT 133.0 02/05/2013   LDLCALC 78 07/14/2015   ALT 22 07/14/2015   AST 19 07/14/2015   NA 140 07/14/2015   K 4.3 07/14/2015   CL 102 07/14/2015   CREATININE 0.85 07/14/2015   BUN 18 07/14/2015   CO2 25 07/14/2015     BNP (last 3 results) No results for input(s): BNP in the last 8760 hours.  ProBNP (last 3 results) No results for input(s): PROBNP in the last 8760 hours.   Other Studies Reviewed Today:  GXT Study Highlights from  11/2015    Negative GXT  The patient walked for 6 minutes on a Bruce protocol GXT. The peak H R was 149 which is 97% predicted maximal HR . There were no ST or T wave changes to suggest ischemia.  Normal BP response  The patient walked for a total of 6 minutes on a Bruce protocol GXT. The patient achieved a peak  HR of 149 which is 97% predicted maximal HR. There were no ST changes to suggest ischemia. The BP response to exercise was normal.      Echo Study Conclusions 06/2015  - Left ventricle: The cavity size was normal. Wall thickness was normal. Systolic function was normal. The estimated ejection fraction was in the range of 55% to 60%. Wall motion was normal; there were no regional wall motion abnormalities. Doppler parameters are consistent with abnormal left ventricular relaxation (grade 1 diastolic dysfunction). - Aortic valve: There was mild regurgitation. - Mitral valve: Moderate prolapse, involving the anterior leaflet and the posterior leaflet. There was mild regurgitation.  Impressions:  - Prior ECHO 2005 - Mild MVP, EF 55%  Assessment/Plan: 1. Situational stress - I think this is what continues to drivie most of her issues. Discussed at length again today and tried to be encouraging.   2. MVP with mild MR from prior echo - BP stable. Would follow for now - consider repeat echo in 2019.  3. Palpitations - ok on low dose beta blocker. She likes to be on this to have less palpitations.   3. HLD - labs followed by PCP - reviewed from Dr. Dagmar Hait back in January.   4. Orthostasis - seems resolved.      Current medicines are reviewed with the patient today.  The patient does not have concerns regarding medicines other than what has been noted above.  The following changes have been made:  See above.  Labs/ tests ordered today include:   No orders of the defined types were placed in this encounter.    Disposition:   FU with  me in 6 months.   Patient is agreeable to this plan and will call if any problems develop in the interim.   SignedTruitt Merle, NP  11/07/2016 9:46 AM  [facility] [address] [place], [place]  [address] Phone: [phone] Fax: [phone]

## 2016-11-07 NOTE — Patient Instructions (Addendum)
We will be checking the following labs today - NONE   Medication Instructions:    Continue with your current medicines.     Testing/Procedures To Be Arranged:  N/A  Follow-Up:   See me in 6 months.    Other Special Instructions:   N/A    If you need a refill on your cardiac medications before your next appointment, please call your pharmacy.   Call the  [facility] office at [phone] if you have any questions, problems or concerns.      

## 2016-11-14 DIAGNOSIS — M542 Cervicalgia: Secondary | ICD-10-CM | POA: Diagnosis not present

## 2016-11-14 DIAGNOSIS — M5413 Radiculopathy, cervicothoracic region: Secondary | ICD-10-CM | POA: Diagnosis not present

## 2016-11-14 DIAGNOSIS — M9901 Segmental and somatic dysfunction of cervical region: Secondary | ICD-10-CM | POA: Diagnosis not present

## 2016-11-14 DIAGNOSIS — M5013 Cervical disc disorder with radiculopathy, cervicothoracic region: Secondary | ICD-10-CM | POA: Diagnosis not present

## 2016-11-16 DIAGNOSIS — M5013 Cervical disc disorder with radiculopathy, cervicothoracic region: Secondary | ICD-10-CM | POA: Diagnosis not present

## 2016-11-16 DIAGNOSIS — M9901 Segmental and somatic dysfunction of cervical region: Secondary | ICD-10-CM | POA: Diagnosis not present

## 2016-11-16 DIAGNOSIS — M5413 Radiculopathy, cervicothoracic region: Secondary | ICD-10-CM | POA: Diagnosis not present

## 2016-11-16 DIAGNOSIS — M542 Cervicalgia: Secondary | ICD-10-CM | POA: Diagnosis not present

## 2016-11-22 DIAGNOSIS — M5413 Radiculopathy, cervicothoracic region: Secondary | ICD-10-CM | POA: Diagnosis not present

## 2016-11-22 DIAGNOSIS — M5013 Cervical disc disorder with radiculopathy, cervicothoracic region: Secondary | ICD-10-CM | POA: Diagnosis not present

## 2016-11-22 DIAGNOSIS — M9901 Segmental and somatic dysfunction of cervical region: Secondary | ICD-10-CM | POA: Diagnosis not present

## 2016-11-22 DIAGNOSIS — M542 Cervicalgia: Secondary | ICD-10-CM | POA: Diagnosis not present

## 2016-11-27 DIAGNOSIS — M810 Age-related osteoporosis without current pathological fracture: Secondary | ICD-10-CM | POA: Diagnosis not present

## 2016-11-27 DIAGNOSIS — N952 Postmenopausal atrophic vaginitis: Secondary | ICD-10-CM | POA: Diagnosis not present

## 2016-11-27 DIAGNOSIS — Z6821 Body mass index (BMI) 21.0-21.9, adult: Secondary | ICD-10-CM | POA: Diagnosis not present

## 2016-11-27 DIAGNOSIS — Z01419 Encounter for gynecological examination (general) (routine) without abnormal findings: Secondary | ICD-10-CM | POA: Diagnosis not present

## 2016-11-28 DIAGNOSIS — M5413 Radiculopathy, cervicothoracic region: Secondary | ICD-10-CM | POA: Diagnosis not present

## 2016-11-28 DIAGNOSIS — M9901 Segmental and somatic dysfunction of cervical region: Secondary | ICD-10-CM | POA: Diagnosis not present

## 2016-11-28 DIAGNOSIS — M542 Cervicalgia: Secondary | ICD-10-CM | POA: Diagnosis not present

## 2016-11-28 DIAGNOSIS — M5013 Cervical disc disorder with radiculopathy, cervicothoracic region: Secondary | ICD-10-CM | POA: Diagnosis not present

## 2016-12-04 DIAGNOSIS — M5013 Cervical disc disorder with radiculopathy, cervicothoracic region: Secondary | ICD-10-CM | POA: Diagnosis not present

## 2016-12-04 DIAGNOSIS — M5413 Radiculopathy, cervicothoracic region: Secondary | ICD-10-CM | POA: Diagnosis not present

## 2016-12-04 DIAGNOSIS — M542 Cervicalgia: Secondary | ICD-10-CM | POA: Diagnosis not present

## 2016-12-04 DIAGNOSIS — M9901 Segmental and somatic dysfunction of cervical region: Secondary | ICD-10-CM | POA: Diagnosis not present

## 2016-12-05 DIAGNOSIS — I519 Heart disease, unspecified: Secondary | ICD-10-CM | POA: Diagnosis not present

## 2016-12-05 DIAGNOSIS — E784 Other hyperlipidemia: Secondary | ICD-10-CM | POA: Diagnosis not present

## 2016-12-05 DIAGNOSIS — I341 Nonrheumatic mitral (valve) prolapse: Secondary | ICD-10-CM | POA: Diagnosis not present

## 2016-12-05 DIAGNOSIS — G4700 Insomnia, unspecified: Secondary | ICD-10-CM | POA: Diagnosis not present

## 2016-12-12 DIAGNOSIS — M9901 Segmental and somatic dysfunction of cervical region: Secondary | ICD-10-CM | POA: Diagnosis not present

## 2016-12-12 DIAGNOSIS — M5013 Cervical disc disorder with radiculopathy, cervicothoracic region: Secondary | ICD-10-CM | POA: Diagnosis not present

## 2016-12-12 DIAGNOSIS — M542 Cervicalgia: Secondary | ICD-10-CM | POA: Diagnosis not present

## 2016-12-12 DIAGNOSIS — M5413 Radiculopathy, cervicothoracic region: Secondary | ICD-10-CM | POA: Diagnosis not present

## 2016-12-19 DIAGNOSIS — M5413 Radiculopathy, cervicothoracic region: Secondary | ICD-10-CM | POA: Diagnosis not present

## 2016-12-19 DIAGNOSIS — M542 Cervicalgia: Secondary | ICD-10-CM | POA: Diagnosis not present

## 2016-12-19 DIAGNOSIS — M9901 Segmental and somatic dysfunction of cervical region: Secondary | ICD-10-CM | POA: Diagnosis not present

## 2016-12-19 DIAGNOSIS — M5013 Cervical disc disorder with radiculopathy, cervicothoracic region: Secondary | ICD-10-CM | POA: Diagnosis not present

## 2016-12-22 DIAGNOSIS — D2271 Melanocytic nevi of right lower limb, including hip: Secondary | ICD-10-CM | POA: Diagnosis not present

## 2016-12-22 DIAGNOSIS — Z85828 Personal history of other malignant neoplasm of skin: Secondary | ICD-10-CM | POA: Diagnosis not present

## 2016-12-22 DIAGNOSIS — D2262 Melanocytic nevi of left upper limb, including shoulder: Secondary | ICD-10-CM | POA: Diagnosis not present

## 2016-12-22 DIAGNOSIS — L821 Other seborrheic keratosis: Secondary | ICD-10-CM | POA: Diagnosis not present

## 2016-12-28 DIAGNOSIS — M5013 Cervical disc disorder with radiculopathy, cervicothoracic region: Secondary | ICD-10-CM | POA: Diagnosis not present

## 2016-12-28 DIAGNOSIS — M542 Cervicalgia: Secondary | ICD-10-CM | POA: Diagnosis not present

## 2016-12-28 DIAGNOSIS — M9901 Segmental and somatic dysfunction of cervical region: Secondary | ICD-10-CM | POA: Diagnosis not present

## 2016-12-28 DIAGNOSIS — M5413 Radiculopathy, cervicothoracic region: Secondary | ICD-10-CM | POA: Diagnosis not present

## 2017-01-04 DIAGNOSIS — M9901 Segmental and somatic dysfunction of cervical region: Secondary | ICD-10-CM | POA: Diagnosis not present

## 2017-01-04 DIAGNOSIS — M5013 Cervical disc disorder with radiculopathy, cervicothoracic region: Secondary | ICD-10-CM | POA: Diagnosis not present

## 2017-01-04 DIAGNOSIS — M5413 Radiculopathy, cervicothoracic region: Secondary | ICD-10-CM | POA: Diagnosis not present

## 2017-01-04 DIAGNOSIS — M542 Cervicalgia: Secondary | ICD-10-CM | POA: Diagnosis not present

## 2017-01-10 DIAGNOSIS — M5013 Cervical disc disorder with radiculopathy, cervicothoracic region: Secondary | ICD-10-CM | POA: Diagnosis not present

## 2017-01-10 DIAGNOSIS — M9901 Segmental and somatic dysfunction of cervical region: Secondary | ICD-10-CM | POA: Diagnosis not present

## 2017-01-10 DIAGNOSIS — M5413 Radiculopathy, cervicothoracic region: Secondary | ICD-10-CM | POA: Diagnosis not present

## 2017-01-10 DIAGNOSIS — M542 Cervicalgia: Secondary | ICD-10-CM | POA: Diagnosis not present

## 2017-01-16 ENCOUNTER — Other Ambulatory Visit: Payer: Self-pay | Admitting: Nurse Practitioner

## 2017-01-16 MED ORDER — ATENOLOL 25 MG PO TABS: 12.5000 mg | ORAL_TABLET | Freq: Every day | ORAL | 2 refills | Status: DC

## 2017-01-16 NOTE — Telephone Encounter (Signed)
Pt's medication was sent to pt's pharmacy as requested. Confirmation received.  °

## 2017-01-19 ENCOUNTER — Encounter: Payer: Self-pay | Admitting: *Deleted

## 2017-05-04 ENCOUNTER — Encounter: Payer: Self-pay | Admitting: Nurse Practitioner

## 2017-05-04 ENCOUNTER — Ambulatory Visit: Payer: Medicare Other | Admitting: Nurse Practitioner

## 2017-05-04 VITALS — BP 150/90 | HR 67 | Ht 64.0 in | Wt 126.8 lb

## 2017-05-04 DIAGNOSIS — R002 Palpitations: Secondary | ICD-10-CM

## 2017-05-04 DIAGNOSIS — E7800 Pure hypercholesterolemia, unspecified: Secondary | ICD-10-CM | POA: Diagnosis not present

## 2017-05-04 DIAGNOSIS — I341 Nonrheumatic mitral (valve) prolapse: Secondary | ICD-10-CM

## 2017-05-04 NOTE — Progress Notes (Signed)
CARDIOLOGY OFFICE NOTE  Date:  05/04/2017    Shannon Brennan Date of Birth: 07-10-1945 Medical Record #[id number]  PCP:  Prince Solian, MD  Cardiologist:  Servando Snare     Chief Complaint  Patient presents with  . Palpitations  . Hypertension    6 month check     History of Present Illness: Shannon Brennan is a 72 y.o. female who presents today for a follow up visit. Former patient of Dr. Sherryl Barters. She is following with me.   She has a history of MVP, palpitations, HLD, and a history of hepatic hemangiomas. Last echo dates back to 2017- had MVP with mild MR noted. She does not have known CAD.   Seen by me lastFebruary of 2017 -lots of stress - had moved in with her 63 year old mother. Has had issues with tolerating statin in the past. When seen back in August - had gotten some rest and had gotten her other siblings to start helping more with their mom - but this did not last. GXT obtained due to orthostasis with exercise - this turned out ok.I saw her back in January of 2018 and she was doing ok - still with lots of stress - her mom was 50. Her brother had died.   Last visit with me was from July - doing ok from our standpoint. Her mother still requiring lots of care. Still with lots of angst with having to be the primary care giver. Does not get enough rest.   Comes back today. Here alone. Doing ok - but had a spell during the snow - BP was high - 146/128. Had a headache. Took 4 aspirins and extra half of atenolol. Stress level is just incredible with taking care of her mom - despite having 24 hour around the clock care. Her siblings do not help. Mom is quite controlling. It is just a very challenging and difficult situation. No recent labs. Sees PCP in February. She is quite tired. BP is up here today but other than the one spell at home, it has been ok at home.   Past Medical History:  Diagnosis Date  . Allergic rhinitis   . Arthritis    mild in right thumb  .  Diverticulosis   . Family history of cardiovascular disease   . Hepatic hemangioma    history of -- followe by Dr. Cristina Gong  . Hx of osteopenia   . Hyperlipidemia    tolerates low dose Lipitor  . Hypertension    controlled with atenolol  . Insomnia   . Mitral valve prolapse    last echo in 2005 showing mild MVP with normal LV function  . Normal nuclear stress test 2006  . Palpitations   . Pyuria   . UTI (urinary tract infection)   . Vaginal atrophy     Past Surgical History:  Procedure Laterality Date  . ABDOMINAL HYSTERECTOMY    . LUMBAR LAMINECTOMY/DECOMPRESSION MICRODISCECTOMY  01/04/2012   Procedure: LUMBAR LAMINECTOMY/DECOMPRESSION MICRODISCECTOMY 1 LEVEL;  Surgeon: Kristeen Miss, MD;  Location: [facility];  Service: Neurosurgery;  Laterality: Left;  Left Lumbar Five-Sacral One Microdiskectomy  . PARTIAL HYSTERECTOMY    . TONSILLECTOMY       Medications: Current Meds  Medication Sig  . atenolol (TENORMIN) 25 MG tablet Take 0.5 tablets (12.5 mg total) by mouth daily.  Marland Kitchen atorvastatin (LIPITOR) 10 MG tablet Take 0.5 tablets (5 mg total) by mouth 3 (three) times a week.  Marland Kitchen  escitalopram (LEXAPRO) 5 MG tablet take one tablet with food nightly  . Estradiol (YUVAFEM) 10 MCG TABS vaginal tablet Yuvafem 10 mcg vaginal tablet  INSERT 1 T  VAGINALLY TWICE A WEEK  . Nutritional Supplements (MELATONIN PO) Take 3 mg by mouth at bedtime.   . traZODone (DESYREL) 50 MG tablet Take 75 mg by mouth daily. Take 1 and 1/2 tablets by mouth daily     Allergies: Allergies  Allergen Reactions  . Erythromycin Diarrhea  . Flagyl [Metronidazole Hcl] Nausea Only  . Levofloxacin Other (See Comments)    Headache  . Wellbutrin [Bupropion Hcl] Other (See Comments)    Dizziness  . Zoloft [Sertraline Hcl]     [employer]   . Restoril Palpitations    Social History: The patient  reports that  has never smoked. she has never used smokeless tobacco. She reports that she does not drink alcohol or use  drugs.   Family History: The patient's family history includes Arthritis in her father and mother; Cancer in her father and mother; Coronary artery disease in her brother; Diabetes in her brother; Heart attack in her brother; Hyperlipidemia in her mother; Hypertension in her brother, brother, and mother; Multiple sclerosis in her brother; Osteopenia in her mother; Stroke in her mother; Ulcers in her mother.   Review of Systems: Please see the history of present illness.   Otherwise, the review of systems is positive for none.   All other systems are reviewed and negative.   Physical Exam: VS:  BP (!) 150/90 (BP Location: Left Arm, Patient Position: Sitting, Cuff Size: Normal)   Pulse 67   Ht 5\' 4"  (1.626 m)   Wt 126 lb 12.8 oz (57.5 kg)   SpO2 98% Comment: at rest  BMI 21.77 kg/m  .  BMI Body mass index is 21.77 kg/m.  Wt Readings from Last 3 Encounters:  05/04/17 126 lb 12.8 oz (57.5 kg)  11/07/16 124 lb 12.8 oz (56.6 kg)  05/23/16 125 lb 9.6 oz (57 kg)   Repeat BP is 134/80 by me.   General: Pleasant. Well developed, well nourished and in no acute distress.   HEENT: Normal.  Neck: Supple, no JVD, carotid bruits, or masses noted.  Cardiac: Regular rate and rhythm. Systolic murmur noted.  No edema.  Respiratory:  Lungs are clear to auscultation bilaterally with normal work of breathing.  GI: Soft and nontender.  MS: No deformity or atrophy. Gait and ROM intact.  Skin: Warm and dry. Color is normal.  Neuro:  Strength and sensation are intact and no gross focal deficits noted.  Psych: Alert, appropriate and with normal affect.   LABORATORY DATA:  EKG:  EKG is not ordered today.  Lab Results  Component Value Date   WBC 10.3 01/03/2012   HGB 14.1 01/03/2012   HCT 42.2 01/03/2012   PLT 245 01/03/2012   GLUCOSE 91 07/14/2015   CHOL 168 07/14/2015   TRIG 90 07/14/2015   HDL 72 07/14/2015   LDLDIRECT 133.0 02/05/2013   LDLCALC 78 07/14/2015   ALT 22 07/14/2015   AST 19  07/14/2015   NA 140 07/14/2015   K 4.3 07/14/2015   CL 102 07/14/2015   CREATININE 0.85 07/14/2015   BUN 18 07/14/2015   CO2 25 07/14/2015       BNP (last 3 results) No results for input(s): BNP in the last 8760 hours.  ProBNP (last 3 results) No results for input(s): PROBNP in the last 8760 hours.   Other Studies  Reviewed Today:  GXT Study Highlights from 11/2015    Negative GXT  The patient walked for 6 minutes on a Bruce protocol GXT. The peak H R was 149 which is 97% predicted maximal HR . There were no ST or T wave changes to suggest ischemia.  Normal BP response  The patient walked for a total of 6 minutes on a Bruce protocol GXT. The patient achieved a peak HR of 149 which is 97% predicted maximal HR. There were no ST changes to suggest ischemia. The BP response to exercise was normal.      Echo Study Conclusions 06/2015  - Left ventricle: The cavity size was normal. Wall thickness was normal. Systolic function was normal. The estimated ejection fraction was in the range of 55% to 60%. Wall motion was normal; there were no regional wall motion abnormalities. Doppler parameters are consistent with abnormal left ventricular relaxation (grade 1 diastolic dysfunction). - Aortic valve: There was mild regurgitation. - Mitral valve: Moderate prolapse, involving the anterior leaflet and the posterior leaflet. There was mild regurgitation.  Impressions:  - Prior ECHO 2005 - Mild MVP, EF 55%  Assessment/Plan:  1. Situational stress - I still think this is what continues to drivie most of her issues. Discussed at length again today and tried to be encouraging. The situation is quite challenging.   2. MVP with mild MR from prior echo - BP little labile - recheck by me is ok. Updating echo next month  3. Palpitations - ok on low dose beta blocker. She likes to be on this to have less palpitations.   3. HLD - no recent labs -  checking today  4. Orthostasis - seems resolved.    Current medicines are reviewed with the patient today.  The patient does not have concerns regarding medicines other than what has been noted above.  The following changes have been made:  See above.  Labs/ tests ordered today include:    Orders Placed This Encounter  Procedures  . Basic metabolic panel  . CBC  . Hepatic function panel  . Lipid panel  . TSH  . ECHOCARDIOGRAM COMPLETE     Disposition:   FU with me in 6 months.   Patient is agreeable to this plan and will call if any problems develop in the interim.   SignedTruitt Merle, NP  05/04/2017 10:56 AM  [facility] [address] [place], [place]  [address] Phone: [phone] Fax: [phone]

## 2017-05-04 NOTE — Patient Instructions (Addendum)
We will be checking the following labs today - BMET, CBC, HPF, Lipids and TSH   Medication Instructions:    Continue with your current medicines.     Testing/Procedures To Be Arranged:  N/A  Follow-Up:   See me in 6 months    Other Special Instructions:   N/A    If you need a refill on your cardiac medications before your next appointment, please call your pharmacy.   Call the [place] office at [phone] if you have any questions, problems or concerns.

## 2017-05-07 LAB — LIPID PANEL
Chol/HDL Ratio: 2.8 ratio (ref 0.0–4.4)
Cholesterol, Total: 190 mg/dL (ref 100–199)
HDL: 68 mg/dL (ref >39)
LDL Calculated: 99 mg/dL (ref 0–99)
Triglycerides: 115 mg/dL (ref 0–149)
VLDL Cholesterol Cal: 23 mg/dL (ref 5–40)

## 2017-05-07 LAB — BASIC METABOLIC PANEL
BUN/Creatinine Ratio: 19 (ref 12–28)
BUN: 16 mg/dL (ref 8–27)
CO2: 20 mmol/L (ref 20–29)
Calcium: 9.8 mg/dL (ref 8.7–10.3)
Chloride: 103 mmol/L (ref 96–106)
Creatinine, Ser: 0.83 mg/dL (ref 0.57–1.00)
GFR calc Af Amer: 81 mL/min/{1.73_m2} (ref >59)
GFR calc non Af Amer: 71 mL/min/{1.73_m2} (ref >59)
Glucose: 103 mg/dL — ABNORMAL HIGH (ref 65–99)
Potassium: 4.2 mmol/L (ref 3.5–5.2)
Sodium: 141 mmol/L (ref 134–144)

## 2017-05-07 LAB — HEPATIC FUNCTION PANEL
ALT: 17 IU/L (ref 0–32)
AST: 19 IU/L (ref 0–40)
Albumin: 4.7 g/dL (ref 3.5–4.8)
Alkaline Phosphatase: 60 IU/L (ref 39–117)
Bilirubin Total: 0.5 mg/dL (ref 0.0–1.2)
Bilirubin, Direct: 0.11 mg/dL (ref 0.00–0.40)
Total Protein: 7.1 g/dL (ref 6.0–8.5)

## 2017-05-07 LAB — CBC
Hematocrit: 40.8 % (ref 34.0–46.6)
Hemoglobin: 14.1 g/dL (ref 11.1–15.9)
MCH: 29.7 pg (ref 26.6–33.0)
MCHC: 34.6 g/dL (ref 31.5–35.7)
MCV: 86 fL (ref 79–97)
Platelets: 201 10*3/uL (ref 150–379)
RBC: 4.74 x10E6/uL (ref 3.77–5.28)
RDW: 14 % (ref 12.3–15.4)
WBC: 6.1 10*3/uL (ref 3.4–10.8)

## 2017-05-07 LAB — TSH: TSH: 3.44 u[IU]/mL (ref 0.450–4.500)

## 2017-05-15 ENCOUNTER — Ambulatory Visit: Payer: Medicare Other | Admitting: Nurse Practitioner

## 2017-06-12 ENCOUNTER — Ambulatory Visit (HOSPITAL_COMMUNITY): Payer: Medicare Other | Attending: Cardiology

## 2017-06-12 ENCOUNTER — Other Ambulatory Visit: Payer: Self-pay

## 2017-06-12 DIAGNOSIS — R002 Palpitations: Secondary | ICD-10-CM | POA: Diagnosis not present

## 2017-06-12 DIAGNOSIS — I272 Pulmonary hypertension, unspecified: Secondary | ICD-10-CM | POA: Insufficient documentation

## 2017-06-12 DIAGNOSIS — I10 Essential (primary) hypertension: Secondary | ICD-10-CM | POA: Diagnosis not present

## 2017-06-12 DIAGNOSIS — I341 Nonrheumatic mitral (valve) prolapse: Secondary | ICD-10-CM | POA: Diagnosis not present

## 2017-06-12 DIAGNOSIS — Z8249 Family history of ischemic heart disease and other diseases of the circulatory system: Secondary | ICD-10-CM | POA: Diagnosis not present

## 2017-06-12 DIAGNOSIS — E785 Hyperlipidemia, unspecified: Secondary | ICD-10-CM | POA: Diagnosis not present

## 2017-06-13 ENCOUNTER — Other Ambulatory Visit: Payer: Self-pay | Admitting: Nurse Practitioner

## 2017-06-13 DIAGNOSIS — R82998 Other abnormal findings in urine: Secondary | ICD-10-CM | POA: Diagnosis not present

## 2017-06-13 DIAGNOSIS — I272 Pulmonary hypertension, unspecified: Secondary | ICD-10-CM

## 2017-06-13 DIAGNOSIS — M810 Age-related osteoporosis without current pathological fracture: Secondary | ICD-10-CM | POA: Diagnosis not present

## 2017-06-13 DIAGNOSIS — E7849 Other hyperlipidemia: Secondary | ICD-10-CM | POA: Diagnosis not present

## 2017-06-18 ENCOUNTER — Encounter (INDEPENDENT_AMBULATORY_CARE_PROVIDER_SITE_OTHER): Payer: Self-pay

## 2017-06-18 NOTE — Telephone Encounter (Signed)
S/w pt is aware process is in place for a referral for Dr. Lamonte Sakai. This phone call seemed to ease the pt's mind. Stated Dr. Agustina Caroli office will call when Dr. Gabriel Carina is ready to schedule.

## 2017-06-19 NOTE — Telephone Encounter (Signed)
S/w pt per Lori's recommendation's is aware of appt with Dr. Lamonte Sakai in March and stated wait time on phone was given to supervisor.  Pt does not need anything at this time.

## 2017-06-20 ENCOUNTER — Encounter: Payer: Self-pay | Admitting: Pulmonary Disease

## 2017-06-20 ENCOUNTER — Ambulatory Visit: Payer: Medicare Other | Admitting: Pulmonary Disease

## 2017-06-20 DIAGNOSIS — Z0000 Encounter for general adult medical examination without abnormal findings: Secondary | ICD-10-CM | POA: Diagnosis not present

## 2017-06-20 DIAGNOSIS — R197 Diarrhea, unspecified: Secondary | ICD-10-CM | POA: Diagnosis not present

## 2017-06-20 DIAGNOSIS — I2789 Other specified pulmonary heart diseases: Secondary | ICD-10-CM | POA: Diagnosis not present

## 2017-06-20 DIAGNOSIS — I272 Pulmonary hypertension, unspecified: Secondary | ICD-10-CM | POA: Insufficient documentation

## 2017-06-20 DIAGNOSIS — I519 Heart disease, unspecified: Secondary | ICD-10-CM | POA: Diagnosis not present

## 2017-06-20 NOTE — Progress Notes (Signed)
Subjective:    Patient ID: Shannon Brennan, female    DOB: Feb 10, 1946, 72 y.o.   MRN: [id number]  HPI  72 year old never smoker presents for evaluation of pulmonary hypertension. She has mitral valve prolapse and has been getting serial echoes, dating back to 2001 in our system. She is to see Dr. Mare Ferrari and now sees Kathrynn Humble at cardiology office. I have reviewed her serial echoes, 2005 RVSP was 27, 2017 again pulmonary pressure was reported normal.  An echo from 06/2017 was reported as grade 2 diastolic dysfunction with normal looking mitral valve and RVSP of 48.  She denies symptoms of dyspnea, cough, pedal edema, orthopnea proximal nocturnal dyspnea. She is maintained on atenolol but that seems to be for palpitations related to mitral valve prolapse rather than hypertension.  She is a lifetime never smoker and denies asthma since childhood or any other pulmonary disease. Chest x-ray 12/2011 was reported clear.  She reports that she had an x-ray done with her PCP in 03/2017 and was told that this was clear. She denies any symptoms of loud snoring excessive daytime somnolence or witnessed apneas.  Bedtime is around 9 PM except around 5 AM feeling rested.  She lives with her 46 year old mother and takes care of her, she climbs 2 flights of stairs about 10 times every day and denies any dyspnea. She denies frequent chest colds.  There is no history of prior DVTs or family history of VTE   Mother is still alive at age 43, one brother died at age 44 of idiopathic with fibrosis and had pulmonary hypertension, another brother died at age 46 of an MI   Past Medical History:  Diagnosis Date  . Allergic rhinitis   . Arthritis    mild in right thumb  . Diverticulosis   . Family history of cardiovascular disease   . Hepatic hemangioma    history of -- followe by Dr. Cristina Gong  . Hx of osteopenia   . Hyperlipidemia    tolerates low dose Lipitor  . Hypertension    controlled with atenolol   . Insomnia   . Mitral valve prolapse    last echo in 2005 showing mild MVP with normal LV function  . Normal nuclear stress test 2006  . Palpitations   . Pyuria   . UTI (urinary tract infection)   . Vaginal atrophy      Past Surgical History:  Procedure Laterality Date  . ABDOMINAL HYSTERECTOMY    . LUMBAR LAMINECTOMY/DECOMPRESSION MICRODISCECTOMY  01/04/2012   Procedure: LUMBAR LAMINECTOMY/DECOMPRESSION MICRODISCECTOMY 1 LEVEL;  Surgeon: Kristeen Miss, MD;  Location: [facility];  Service: Neurosurgery;  Laterality: Left;  Left Lumbar Five-Sacral One Microdiskectomy  . PARTIAL HYSTERECTOMY    . TONSILLECTOMY      Allergies  Allergen Reactions  . Erythromycin Diarrhea  . Flagyl [Metronidazole Hcl] Nausea Only  . Levofloxacin Other (See Comments)    Headache  . Wellbutrin [Bupropion Hcl] Other (See Comments)    Dizziness  . Zoloft [Sertraline Hcl]     [employer]   . Restoril Palpitations    Social History   Socioeconomic History  . Marital status: Married    Spouse name: Not on file  . Number of children: Not on file  . Years of education: Not on file  . Highest education level: Not on file  Social Needs  . Financial resource strain: Not on file  . Food insecurity - worry: Not on file  . Food insecurity - inability:  Not on file  . Transportation needs - medical: Not on file  . Transportation needs - non-medical: Not on file  Occupational History  . Not on file  Tobacco Use  . Smoking status: Never Smoker  . Smokeless tobacco: Never Used  Substance and Sexual Activity  . Alcohol use: No  . Drug use: No  . Sexual activity: No    Birth control/protection: Surgical  Other Topics Concern  . Not on file  Social History Narrative  . Not on file      Family History  Problem Relation Age of Onset  . Arthritis Father   . Cancer Father   . Arthritis Mother   . Cancer Mother        skin  . Hypertension Mother   . Stroke Mother   . Osteopenia Mother         DDD  . Ulcers Mother   . Hyperlipidemia Mother   . Heart attack Brother   . Hypertension Brother   . Hypertension Brother   . Diabetes Brother   . Coronary artery disease Brother        CABG x1  . Multiple sclerosis Brother      Review of Systems Constitutional: negative for anorexia, fevers and sweats  Eyes: negative for irritation, redness and visual disturbance  Ears, nose, mouth, throat, and face: negative for earaches, epistaxis, nasal congestion and sore throat  Respiratory: negative for cough, dyspnea on exertion, sputum and wheezing  Cardiovascular: negative for chest pain, dyspnea, lower extremity edema, orthopnea, palpitations and syncope  Gastrointestinal: negative for abdominal pain, constipation, diarrhea, melena, nausea and vomiting  Genitourinary:negative for dysuria, frequency and hematuria  Hematologic/lymphatic: negative for bleeding, easy bruising and lymphadenopathy  Musculoskeletal:negative for arthralgias, muscle weakness and stiff joints  Neurological: negative for coordination problems, gait problems, headaches and weakness  Endocrine: negative for diabetic symptoms including polydipsia, polyuria and weight loss     Objective:   Physical Exam   Gen. Pleasant, well-nourished, in no distress, normal affect ENT - no lesions, no post nasal drip Neck: No JVD, no thyromegaly, no carotid bruits Lungs: no use of accessory muscles, no dullness to percussion, clear without rales or rhonchi  Cardiovascular: Rhythm regular, heart sounds  normal, no murmurs or gallops, no peripheral edema Abdomen: soft and non-tender, no hepatosplenomegaly, BS normal. Musculoskeletal: No deformities, no cyanosis or clubbing Neuro:  alert, non focal        Assessment & Plan:

## 2017-06-20 NOTE — Patient Instructions (Signed)
We discussed causes of pulmonary hypertension Best strategy would be to repeat echo in 4-6 months

## 2017-06-20 NOTE — Assessment & Plan Note (Addendum)
We discussed causes of pulmonary hypertension-most importantly mitral valve appears normal.  Diastolic dysfunction still possible but she does not have any obvious uncontrolled hypertension or other cause.  Most importantly she is asymptomatic without any significant dyspnea. She does not seem to have significant pulmonary disease or sleep disordered breathing.  As such I do not feel any workup is necessary at this time  Best strategy would be to repeat echo in 4-6 months and to proceed with a right heart cath if pulmonary pressure remains high.   Greater than 50% time x 45mins was spent in counseling and coordination of care with the patient

## 2017-06-21 DIAGNOSIS — Z1212 Encounter for screening for malignant neoplasm of rectum: Secondary | ICD-10-CM | POA: Diagnosis not present

## 2017-06-25 ENCOUNTER — Telehealth: Payer: Self-pay | Admitting: *Deleted

## 2017-06-25 NOTE — Telephone Encounter (Signed)
Sounds good

## 2017-06-25 NOTE — Telephone Encounter (Signed)
S/w pt stated echo was already scheduled and this was for 4 months.  Will call if needs anything, pt has appt in July.  Will send to [facility] to [place].

## 2017-06-25 NOTE — Progress Notes (Signed)
Please let her know that I got Dr. Bari Mantis note - to have repeat echo in 6 months. I will be happy to facilitate as needed and will see back as planned.   Cecille Rubin

## 2017-06-26 DIAGNOSIS — M5413 Radiculopathy, cervicothoracic region: Secondary | ICD-10-CM | POA: Diagnosis not present

## 2017-06-26 DIAGNOSIS — M5013 Cervical disc disorder with radiculopathy, cervicothoracic region: Secondary | ICD-10-CM | POA: Diagnosis not present

## 2017-06-26 DIAGNOSIS — M542 Cervicalgia: Secondary | ICD-10-CM | POA: Diagnosis not present

## 2017-06-26 DIAGNOSIS — M9901 Segmental and somatic dysfunction of cervical region: Secondary | ICD-10-CM | POA: Diagnosis not present

## 2017-06-28 DIAGNOSIS — M542 Cervicalgia: Secondary | ICD-10-CM | POA: Diagnosis not present

## 2017-06-28 DIAGNOSIS — M5413 Radiculopathy, cervicothoracic region: Secondary | ICD-10-CM | POA: Diagnosis not present

## 2017-06-28 DIAGNOSIS — M9901 Segmental and somatic dysfunction of cervical region: Secondary | ICD-10-CM | POA: Diagnosis not present

## 2017-06-28 DIAGNOSIS — M5013 Cervical disc disorder with radiculopathy, cervicothoracic region: Secondary | ICD-10-CM | POA: Diagnosis not present

## 2017-07-02 DIAGNOSIS — M5013 Cervical disc disorder with radiculopathy, cervicothoracic region: Secondary | ICD-10-CM | POA: Diagnosis not present

## 2017-07-02 DIAGNOSIS — M9901 Segmental and somatic dysfunction of cervical region: Secondary | ICD-10-CM | POA: Diagnosis not present

## 2017-07-02 DIAGNOSIS — M5413 Radiculopathy, cervicothoracic region: Secondary | ICD-10-CM | POA: Diagnosis not present

## 2017-07-02 DIAGNOSIS — M542 Cervicalgia: Secondary | ICD-10-CM | POA: Diagnosis not present

## 2017-07-05 DIAGNOSIS — M542 Cervicalgia: Secondary | ICD-10-CM | POA: Diagnosis not present

## 2017-07-05 DIAGNOSIS — M5013 Cervical disc disorder with radiculopathy, cervicothoracic region: Secondary | ICD-10-CM | POA: Diagnosis not present

## 2017-07-05 DIAGNOSIS — M5413 Radiculopathy, cervicothoracic region: Secondary | ICD-10-CM | POA: Diagnosis not present

## 2017-07-05 DIAGNOSIS — M9901 Segmental and somatic dysfunction of cervical region: Secondary | ICD-10-CM | POA: Diagnosis not present

## 2017-07-10 DIAGNOSIS — M542 Cervicalgia: Secondary | ICD-10-CM | POA: Diagnosis not present

## 2017-07-10 DIAGNOSIS — M5013 Cervical disc disorder with radiculopathy, cervicothoracic region: Secondary | ICD-10-CM | POA: Diagnosis not present

## 2017-07-10 DIAGNOSIS — M5413 Radiculopathy, cervicothoracic region: Secondary | ICD-10-CM | POA: Diagnosis not present

## 2017-07-10 DIAGNOSIS — M9901 Segmental and somatic dysfunction of cervical region: Secondary | ICD-10-CM | POA: Diagnosis not present

## 2017-07-16 ENCOUNTER — Institutional Professional Consult (permissible substitution): Payer: Medicare Other | Admitting: Emergency Medicine

## 2017-07-17 DIAGNOSIS — M5413 Radiculopathy, cervicothoracic region: Secondary | ICD-10-CM | POA: Diagnosis not present

## 2017-07-17 DIAGNOSIS — M542 Cervicalgia: Secondary | ICD-10-CM | POA: Diagnosis not present

## 2017-07-17 DIAGNOSIS — M5013 Cervical disc disorder with radiculopathy, cervicothoracic region: Secondary | ICD-10-CM | POA: Diagnosis not present

## 2017-07-17 DIAGNOSIS — M9901 Segmental and somatic dysfunction of cervical region: Secondary | ICD-10-CM | POA: Diagnosis not present

## 2017-07-19 DIAGNOSIS — M5013 Cervical disc disorder with radiculopathy, cervicothoracic region: Secondary | ICD-10-CM | POA: Diagnosis not present

## 2017-07-19 DIAGNOSIS — M5413 Radiculopathy, cervicothoracic region: Secondary | ICD-10-CM | POA: Diagnosis not present

## 2017-07-19 DIAGNOSIS — M9901 Segmental and somatic dysfunction of cervical region: Secondary | ICD-10-CM | POA: Diagnosis not present

## 2017-07-19 DIAGNOSIS — M542 Cervicalgia: Secondary | ICD-10-CM | POA: Diagnosis not present

## 2017-07-23 DIAGNOSIS — N390 Urinary tract infection, site not specified: Secondary | ICD-10-CM | POA: Diagnosis not present

## 2017-07-24 DIAGNOSIS — M5013 Cervical disc disorder with radiculopathy, cervicothoracic region: Secondary | ICD-10-CM | POA: Diagnosis not present

## 2017-07-24 DIAGNOSIS — M9901 Segmental and somatic dysfunction of cervical region: Secondary | ICD-10-CM | POA: Diagnosis not present

## 2017-07-24 DIAGNOSIS — M542 Cervicalgia: Secondary | ICD-10-CM | POA: Diagnosis not present

## 2017-07-24 DIAGNOSIS — M5413 Radiculopathy, cervicothoracic region: Secondary | ICD-10-CM | POA: Diagnosis not present

## 2017-07-26 DIAGNOSIS — M5413 Radiculopathy, cervicothoracic region: Secondary | ICD-10-CM | POA: Diagnosis not present

## 2017-07-26 DIAGNOSIS — M9901 Segmental and somatic dysfunction of cervical region: Secondary | ICD-10-CM | POA: Diagnosis not present

## 2017-07-26 DIAGNOSIS — M542 Cervicalgia: Secondary | ICD-10-CM | POA: Diagnosis not present

## 2017-07-26 DIAGNOSIS — M5013 Cervical disc disorder with radiculopathy, cervicothoracic region: Secondary | ICD-10-CM | POA: Diagnosis not present

## 2017-07-31 DIAGNOSIS — M9901 Segmental and somatic dysfunction of cervical region: Secondary | ICD-10-CM | POA: Diagnosis not present

## 2017-07-31 DIAGNOSIS — M5013 Cervical disc disorder with radiculopathy, cervicothoracic region: Secondary | ICD-10-CM | POA: Diagnosis not present

## 2017-07-31 DIAGNOSIS — M5413 Radiculopathy, cervicothoracic region: Secondary | ICD-10-CM | POA: Diagnosis not present

## 2017-07-31 DIAGNOSIS — M542 Cervicalgia: Secondary | ICD-10-CM | POA: Diagnosis not present

## 2017-08-02 DIAGNOSIS — M9901 Segmental and somatic dysfunction of cervical region: Secondary | ICD-10-CM | POA: Diagnosis not present

## 2017-08-02 DIAGNOSIS — M5413 Radiculopathy, cervicothoracic region: Secondary | ICD-10-CM | POA: Diagnosis not present

## 2017-08-02 DIAGNOSIS — M542 Cervicalgia: Secondary | ICD-10-CM | POA: Diagnosis not present

## 2017-08-02 DIAGNOSIS — M5013 Cervical disc disorder with radiculopathy, cervicothoracic region: Secondary | ICD-10-CM | POA: Diagnosis not present

## 2017-08-07 DIAGNOSIS — M9901 Segmental and somatic dysfunction of cervical region: Secondary | ICD-10-CM | POA: Diagnosis not present

## 2017-08-07 DIAGNOSIS — M5413 Radiculopathy, cervicothoracic region: Secondary | ICD-10-CM | POA: Diagnosis not present

## 2017-08-07 DIAGNOSIS — M5013 Cervical disc disorder with radiculopathy, cervicothoracic region: Secondary | ICD-10-CM | POA: Diagnosis not present

## 2017-08-07 DIAGNOSIS — M542 Cervicalgia: Secondary | ICD-10-CM | POA: Diagnosis not present

## 2017-08-14 DIAGNOSIS — M5413 Radiculopathy, cervicothoracic region: Secondary | ICD-10-CM | POA: Diagnosis not present

## 2017-08-14 DIAGNOSIS — M5013 Cervical disc disorder with radiculopathy, cervicothoracic region: Secondary | ICD-10-CM | POA: Diagnosis not present

## 2017-08-14 DIAGNOSIS — M9901 Segmental and somatic dysfunction of cervical region: Secondary | ICD-10-CM | POA: Diagnosis not present

## 2017-08-14 DIAGNOSIS — M542 Cervicalgia: Secondary | ICD-10-CM | POA: Diagnosis not present

## 2017-08-16 DIAGNOSIS — M542 Cervicalgia: Secondary | ICD-10-CM | POA: Diagnosis not present

## 2017-08-16 DIAGNOSIS — M5013 Cervical disc disorder with radiculopathy, cervicothoracic region: Secondary | ICD-10-CM | POA: Diagnosis not present

## 2017-08-16 DIAGNOSIS — M5413 Radiculopathy, cervicothoracic region: Secondary | ICD-10-CM | POA: Diagnosis not present

## 2017-08-16 DIAGNOSIS — M9901 Segmental and somatic dysfunction of cervical region: Secondary | ICD-10-CM | POA: Diagnosis not present

## 2017-08-23 DIAGNOSIS — M9901 Segmental and somatic dysfunction of cervical region: Secondary | ICD-10-CM | POA: Diagnosis not present

## 2017-08-23 DIAGNOSIS — M5013 Cervical disc disorder with radiculopathy, cervicothoracic region: Secondary | ICD-10-CM | POA: Diagnosis not present

## 2017-08-23 DIAGNOSIS — M542 Cervicalgia: Secondary | ICD-10-CM | POA: Diagnosis not present

## 2017-08-23 DIAGNOSIS — M5413 Radiculopathy, cervicothoracic region: Secondary | ICD-10-CM | POA: Diagnosis not present

## 2017-09-04 DIAGNOSIS — M542 Cervicalgia: Secondary | ICD-10-CM | POA: Diagnosis not present

## 2017-09-04 DIAGNOSIS — M5413 Radiculopathy, cervicothoracic region: Secondary | ICD-10-CM | POA: Diagnosis not present

## 2017-09-04 DIAGNOSIS — M9901 Segmental and somatic dysfunction of cervical region: Secondary | ICD-10-CM | POA: Diagnosis not present

## 2017-09-04 DIAGNOSIS — M5013 Cervical disc disorder with radiculopathy, cervicothoracic region: Secondary | ICD-10-CM | POA: Diagnosis not present

## 2017-09-06 DIAGNOSIS — M9901 Segmental and somatic dysfunction of cervical region: Secondary | ICD-10-CM | POA: Diagnosis not present

## 2017-09-06 DIAGNOSIS — M5013 Cervical disc disorder with radiculopathy, cervicothoracic region: Secondary | ICD-10-CM | POA: Diagnosis not present

## 2017-09-06 DIAGNOSIS — M542 Cervicalgia: Secondary | ICD-10-CM | POA: Diagnosis not present

## 2017-09-06 DIAGNOSIS — M5413 Radiculopathy, cervicothoracic region: Secondary | ICD-10-CM | POA: Diagnosis not present

## 2017-09-11 DIAGNOSIS — M542 Cervicalgia: Secondary | ICD-10-CM | POA: Diagnosis not present

## 2017-09-11 DIAGNOSIS — M9901 Segmental and somatic dysfunction of cervical region: Secondary | ICD-10-CM | POA: Diagnosis not present

## 2017-09-11 DIAGNOSIS — M5413 Radiculopathy, cervicothoracic region: Secondary | ICD-10-CM | POA: Diagnosis not present

## 2017-09-11 DIAGNOSIS — M5013 Cervical disc disorder with radiculopathy, cervicothoracic region: Secondary | ICD-10-CM | POA: Diagnosis not present

## 2017-09-18 DIAGNOSIS — M542 Cervicalgia: Secondary | ICD-10-CM | POA: Diagnosis not present

## 2017-09-18 DIAGNOSIS — M5413 Radiculopathy, cervicothoracic region: Secondary | ICD-10-CM | POA: Diagnosis not present

## 2017-09-18 DIAGNOSIS — M5013 Cervical disc disorder with radiculopathy, cervicothoracic region: Secondary | ICD-10-CM | POA: Diagnosis not present

## 2017-09-18 DIAGNOSIS — M9901 Segmental and somatic dysfunction of cervical region: Secondary | ICD-10-CM | POA: Diagnosis not present

## 2017-09-25 DIAGNOSIS — M4712 Other spondylosis with myelopathy, cervical region: Secondary | ICD-10-CM | POA: Diagnosis not present

## 2017-09-25 DIAGNOSIS — M9901 Segmental and somatic dysfunction of cervical region: Secondary | ICD-10-CM | POA: Diagnosis not present

## 2017-09-25 DIAGNOSIS — M542 Cervicalgia: Secondary | ICD-10-CM | POA: Diagnosis not present

## 2017-09-25 DIAGNOSIS — M9902 Segmental and somatic dysfunction of thoracic region: Secondary | ICD-10-CM | POA: Diagnosis not present

## 2017-09-26 ENCOUNTER — Other Ambulatory Visit: Payer: Self-pay | Admitting: Nurse Practitioner

## 2017-09-26 DIAGNOSIS — H5213 Myopia, bilateral: Secondary | ICD-10-CM | POA: Diagnosis not present

## 2017-10-02 DIAGNOSIS — M9902 Segmental and somatic dysfunction of thoracic region: Secondary | ICD-10-CM | POA: Diagnosis not present

## 2017-10-02 DIAGNOSIS — M4712 Other spondylosis with myelopathy, cervical region: Secondary | ICD-10-CM | POA: Diagnosis not present

## 2017-10-02 DIAGNOSIS — M9901 Segmental and somatic dysfunction of cervical region: Secondary | ICD-10-CM | POA: Diagnosis not present

## 2017-10-02 DIAGNOSIS — M542 Cervicalgia: Secondary | ICD-10-CM | POA: Diagnosis not present

## 2017-10-08 ENCOUNTER — Other Ambulatory Visit: Payer: Self-pay

## 2017-10-08 ENCOUNTER — Ambulatory Visit (HOSPITAL_COMMUNITY): Payer: Medicare Other | Attending: Cardiovascular Disease

## 2017-10-08 DIAGNOSIS — I119 Hypertensive heart disease without heart failure: Secondary | ICD-10-CM | POA: Insufficient documentation

## 2017-10-08 DIAGNOSIS — I341 Nonrheumatic mitral (valve) prolapse: Secondary | ICD-10-CM | POA: Insufficient documentation

## 2017-10-08 DIAGNOSIS — I272 Pulmonary hypertension, unspecified: Secondary | ICD-10-CM | POA: Insufficient documentation

## 2017-10-08 DIAGNOSIS — E785 Hyperlipidemia, unspecified: Secondary | ICD-10-CM | POA: Diagnosis not present

## 2017-10-08 DIAGNOSIS — Z1231 Encounter for screening mammogram for malignant neoplasm of breast: Secondary | ICD-10-CM | POA: Diagnosis not present

## 2017-10-09 DIAGNOSIS — M542 Cervicalgia: Secondary | ICD-10-CM | POA: Diagnosis not present

## 2017-10-09 DIAGNOSIS — M4712 Other spondylosis with myelopathy, cervical region: Secondary | ICD-10-CM | POA: Diagnosis not present

## 2017-10-09 DIAGNOSIS — M9902 Segmental and somatic dysfunction of thoracic region: Secondary | ICD-10-CM | POA: Diagnosis not present

## 2017-10-09 DIAGNOSIS — M9901 Segmental and somatic dysfunction of cervical region: Secondary | ICD-10-CM | POA: Diagnosis not present

## 2017-10-10 ENCOUNTER — Telehealth: Payer: Self-pay | Admitting: *Deleted

## 2017-10-10 NOTE — Telephone Encounter (Signed)
Pt calling in today due to concern about recent echo.  Was sent to pt on mychart without being resulted.  Got a call today with results.  Wanted to know why feb echo showed pulmonary arteries pressure and recent echo did not show this.  Pt stated why wouldn't both show due to looking for pulmonary HTN??  Also concerned, the first echo showed grade  Diastolic dysfunction and this recent echo showed grade 1.  Stated this is mild but will get Lor to look at both echo's and will return pt's call.  Will send to Cecille Rubin to advise.

## 2017-10-11 DIAGNOSIS — M4712 Other spondylosis with myelopathy, cervical region: Secondary | ICD-10-CM | POA: Diagnosis not present

## 2017-10-11 DIAGNOSIS — M9901 Segmental and somatic dysfunction of cervical region: Secondary | ICD-10-CM | POA: Diagnosis not present

## 2017-10-11 DIAGNOSIS — M542 Cervicalgia: Secondary | ICD-10-CM | POA: Diagnosis not present

## 2017-10-11 DIAGNOSIS — M9902 Segmental and somatic dysfunction of thoracic region: Secondary | ICD-10-CM | POA: Diagnosis not present

## 2017-10-14 NOTE — Telephone Encounter (Signed)
Both echos have been reviewed by the supervisor as well as Dr. Meda Coffee. Both studies are correct. I have spoken to [facility] by phone and given her this information - will also pass on to Dr. Jacquelynn Cree as well. Will discuss further with her when I see her in July - will probably repeat the echo in one year.

## 2017-10-16 DIAGNOSIS — M4712 Other spondylosis with myelopathy, cervical region: Secondary | ICD-10-CM | POA: Diagnosis not present

## 2017-10-16 DIAGNOSIS — M9901 Segmental and somatic dysfunction of cervical region: Secondary | ICD-10-CM | POA: Diagnosis not present

## 2017-10-16 DIAGNOSIS — M542 Cervicalgia: Secondary | ICD-10-CM | POA: Diagnosis not present

## 2017-10-16 DIAGNOSIS — M9902 Segmental and somatic dysfunction of thoracic region: Secondary | ICD-10-CM | POA: Diagnosis not present

## 2017-10-18 DIAGNOSIS — M9902 Segmental and somatic dysfunction of thoracic region: Secondary | ICD-10-CM | POA: Diagnosis not present

## 2017-10-18 DIAGNOSIS — M542 Cervicalgia: Secondary | ICD-10-CM | POA: Diagnosis not present

## 2017-10-18 DIAGNOSIS — M4712 Other spondylosis with myelopathy, cervical region: Secondary | ICD-10-CM | POA: Diagnosis not present

## 2017-10-18 DIAGNOSIS — M9901 Segmental and somatic dysfunction of cervical region: Secondary | ICD-10-CM | POA: Diagnosis not present

## 2017-10-19 ENCOUNTER — Encounter: Payer: Self-pay | Admitting: Pulmonary Disease

## 2017-10-19 ENCOUNTER — Ambulatory Visit: Payer: Medicare Other | Admitting: Pulmonary Disease

## 2017-10-19 DIAGNOSIS — I272 Pulmonary hypertension, unspecified: Secondary | ICD-10-CM | POA: Diagnosis not present

## 2017-10-19 NOTE — Progress Notes (Signed)
   Subjective:    Patient ID: Shannon Brennan, female    DOB: 09-09-1945, 72 y.o.   MRN: [id number]  HPI  72 year old never smoker  for FU of pulmonary hypertension. She has mitral valve prolapse and has been getting serial echoes, dating back to 2001   She denies dyspnea, cough, palpitations or pedal edema. Her brother had interstitial lung disease  in pulmonary hypertension.  Repeat echo did not show any evidence of bony hypertension, mitral valve appeared normal she reports that occasionally when she checks her saturation with her mother's Oximeter, saturations will drop to 93%  Oxygen saturation stayed at 98% on 3 laps and heart rate went up from 59-80  Significant tests/ events reviewed  2005 RVSP was 27 2017 again pulmonary pressure was reported normal.   echo  06/2017 was reported as grade 2 diastolic dysfunction with normal looking mitral valve and RVSP of 48.  Review of Systems Patient denies significant dyspnea,cough, hemoptysis,  chest pain, palpitations, pedal edema, orthopnea, paroxysmal nocturnal dyspnea, lightheadedness, nausea, vomiting, abdominal or  leg pains      Objective:   Physical Exam  Gen. Pleasant, well-nourished, in no distress ENT - no thrush, no post nasal drip Neck: No JVD, no thyromegaly, no carotid bruits Lungs: no use of accessory muscles, no dullness to percussion, clear without rales or rhonchi  Cardiovascular: Rhythm regular, heart sounds  normal, no murmurs or gallops, no peripheral edema Musculoskeletal: No deformities, no cyanosis or clubbing        Assessment & Plan:

## 2017-10-19 NOTE — Patient Instructions (Signed)
Follow up as needed

## 2017-10-19 NOTE — Assessment & Plan Note (Signed)
Interesting that follow-up of echo has not shown any evidence of pulmonary hypertension . She does not seem to have any readings with diastolic dysfunction, mitral valve appears normal and no suggestion of significant mitral regurg. Not clear what caused the discrepancy  I have asked her to follow-up if she develops dyspnea, chronic cough or pedal edema.  Otherwise she will be followed by annual echoes by cardiology

## 2017-10-23 DIAGNOSIS — M542 Cervicalgia: Secondary | ICD-10-CM | POA: Diagnosis not present

## 2017-10-23 DIAGNOSIS — M9901 Segmental and somatic dysfunction of cervical region: Secondary | ICD-10-CM | POA: Diagnosis not present

## 2017-10-23 DIAGNOSIS — M9902 Segmental and somatic dysfunction of thoracic region: Secondary | ICD-10-CM | POA: Diagnosis not present

## 2017-10-23 DIAGNOSIS — M4712 Other spondylosis with myelopathy, cervical region: Secondary | ICD-10-CM | POA: Diagnosis not present

## 2017-10-25 DIAGNOSIS — M4712 Other spondylosis with myelopathy, cervical region: Secondary | ICD-10-CM | POA: Diagnosis not present

## 2017-10-25 DIAGNOSIS — M542 Cervicalgia: Secondary | ICD-10-CM | POA: Diagnosis not present

## 2017-10-25 DIAGNOSIS — M9902 Segmental and somatic dysfunction of thoracic region: Secondary | ICD-10-CM | POA: Diagnosis not present

## 2017-10-25 DIAGNOSIS — M9901 Segmental and somatic dysfunction of cervical region: Secondary | ICD-10-CM | POA: Diagnosis not present

## 2017-10-30 DIAGNOSIS — M542 Cervicalgia: Secondary | ICD-10-CM | POA: Diagnosis not present

## 2017-10-30 DIAGNOSIS — M4712 Other spondylosis with myelopathy, cervical region: Secondary | ICD-10-CM | POA: Diagnosis not present

## 2017-10-30 DIAGNOSIS — M9902 Segmental and somatic dysfunction of thoracic region: Secondary | ICD-10-CM | POA: Diagnosis not present

## 2017-10-30 DIAGNOSIS — M9901 Segmental and somatic dysfunction of cervical region: Secondary | ICD-10-CM | POA: Diagnosis not present

## 2017-11-06 DIAGNOSIS — M542 Cervicalgia: Secondary | ICD-10-CM | POA: Diagnosis not present

## 2017-11-06 DIAGNOSIS — M4712 Other spondylosis with myelopathy, cervical region: Secondary | ICD-10-CM | POA: Diagnosis not present

## 2017-11-06 DIAGNOSIS — M9902 Segmental and somatic dysfunction of thoracic region: Secondary | ICD-10-CM | POA: Diagnosis not present

## 2017-11-06 DIAGNOSIS — M9901 Segmental and somatic dysfunction of cervical region: Secondary | ICD-10-CM | POA: Diagnosis not present

## 2017-11-08 DIAGNOSIS — M4712 Other spondylosis with myelopathy, cervical region: Secondary | ICD-10-CM | POA: Diagnosis not present

## 2017-11-08 DIAGNOSIS — M542 Cervicalgia: Secondary | ICD-10-CM | POA: Diagnosis not present

## 2017-11-08 DIAGNOSIS — M9901 Segmental and somatic dysfunction of cervical region: Secondary | ICD-10-CM | POA: Diagnosis not present

## 2017-11-08 DIAGNOSIS — M9902 Segmental and somatic dysfunction of thoracic region: Secondary | ICD-10-CM | POA: Diagnosis not present

## 2017-11-13 ENCOUNTER — Ambulatory Visit: Payer: Medicare Other | Admitting: Nurse Practitioner

## 2017-11-13 ENCOUNTER — Encounter: Payer: Self-pay | Admitting: Nurse Practitioner

## 2017-11-13 VITALS — BP 138/70 | HR 62 | Ht 63.5 in | Wt 126.4 lb

## 2017-11-13 DIAGNOSIS — R002 Palpitations: Secondary | ICD-10-CM

## 2017-11-13 DIAGNOSIS — I272 Pulmonary hypertension, unspecified: Secondary | ICD-10-CM | POA: Diagnosis not present

## 2017-11-13 DIAGNOSIS — I341 Nonrheumatic mitral (valve) prolapse: Secondary | ICD-10-CM | POA: Diagnosis not present

## 2017-11-13 DIAGNOSIS — E7800 Pure hypercholesterolemia, unspecified: Secondary | ICD-10-CM

## 2017-11-13 NOTE — Patient Instructions (Addendum)
We will be checking the following labs today - NONE   Medication Instructions:    Continue with your current medicines.     Testing/Procedures To Be Arranged:  N/A  Follow-Up:   See me in 6 months.    Other Special Instructions:   N/A    If you need a refill on your cardiac medications before your next appointment, please call your pharmacy.   Call the [facility] office at [phone] if you have any questions, problems or concerns.      

## 2017-11-13 NOTE — Progress Notes (Signed)
CARDIOLOGY OFFICE NOTE  Date:  11/13/2017    Shannon Brennan Date of Birth: 1946-03-09 Medical Record #[id number]  PCP:  Prince Solian, MD  Cardiologist:  Servando Snare     Chief Complaint  Patient presents with  . Cardiac Valve Problem    6 month check.     History of Present Illness: Shannon Brennan is a 72 y.o. female who presents today for a 6 month check. Former patient of Dr. Sherryl Barters. She is following with me.   She has a history of MVP, palpitations, HLD, and a history of hepatic hemangiomas. Last echo dates back to 2017- had MVP with mild MR noted. She does not have known CAD.   Shannon Brennan melastFebruaryof 2017-lots of stress - had moved in with her 61 year old mother. Has had issues with tolerating statin in the past.Whenseen back in August - had gotten some restand had gotten her other siblings to start helping more with their mom- but this did not last. GXT obtained due to orthostasis with exercise - this turned out ok. I saw her back in January of 2018 and she was doing ok - still with lots of stress - her mom was 50. Her brother had died.  Seen in 11/30/2016 - doing ok from our standpoint. Her mother still requiring lots of care. Still with lots of angst with having to be the primary care giver. Chronically does not get enough rest.   Last seen in January - she was doing ok - but had a spell during the snow the month prior - BP elevated - took aspirin and extra atenolol. Stress level just incredible despite having 24 hour around the clock care. We got her echo updated - PAP elevated with pulmonary HTN noted - referred to pulmonary - repeat study however looked completely normal. She was never treated. Both echos were reviewed by multiple providers as well.   Comes back today. Here alone. She feels like she is ok. Stress remains the issue. She is fatigued but still able to "face the day". No chest pain. BP ok for the most part - some low readings in the  767 systolic range but nothing consistently. She knows she needs to try and do more "self care" activities - she actually had a massage last week.   Past Medical History:  Diagnosis Date  . Allergic rhinitis   . Arthritis    mild in right thumb  . Diverticulosis   . Family history of cardiovascular disease   . Hepatic hemangioma    history of -- followe by Dr. Cristina Gong  . Hx of osteopenia   . Hyperlipidemia    tolerates low dose Lipitor  . Hypertension    controlled with atenolol  . Insomnia   . Mitral valve prolapse    last echo in 2005 showing mild MVP with normal LV function  . Normal nuclear stress test 2006  . Palpitations   . Pyuria   . UTI (urinary tract infection)   . Vaginal atrophy     Past Surgical History:  Procedure Laterality Date  . ABDOMINAL HYSTERECTOMY    . LUMBAR LAMINECTOMY/DECOMPRESSION MICRODISCECTOMY  01/04/2012   Procedure: LUMBAR LAMINECTOMY/DECOMPRESSION MICRODISCECTOMY 1 LEVEL;  Surgeon: Kristeen Miss, MD;  Location: [facility];  Service: Neurosurgery;  Laterality: Left;  Left Lumbar Five-Sacral One Microdiskectomy  . PARTIAL HYSTERECTOMY    . TONSILLECTOMY       Medications: Current Meds  Medication Sig  . atenolol (  TENORMIN) 25 MG tablet TAKE 1/2 TABLET(12.5 MG) BY MOUTH DAILY  . atorvastatin (LIPITOR) 10 MG tablet Take 0.5 tablets (5 mg total) by mouth 3 (three) times a week.  . cetirizine (ZYRTEC) 5 MG tablet Take 5 mg by mouth daily.  . Estradiol (YUVAFEM) 10 MCG TABS vaginal tablet Yuvafem 10 mcg vaginal tablet  INSERT 1 T  VAGINALLY TWICE A WEEK  . Nutritional Supplements (MELATONIN PO) Take 5 mg by mouth at bedtime.   . Probiotic Product (ALIGN PO) Take by mouth.  . traZODone (DESYREL) 50 MG tablet Take 50 mg by mouth at bedtime.     Allergies: Allergies  Allergen Reactions  . Erythromycin Diarrhea  . Flagyl [Metronidazole Hcl] Nausea Only  . Levofloxacin Other (See Comments)    Headache  . Wellbutrin [Bupropion Hcl] Other (See  Comments)    Dizziness  . Zoloft [Sertraline Hcl]     [employer]   . Restoril Palpitations    Social History: The patient  reports that she has never smoked. She has never used smokeless tobacco. She reports that she does not drink alcohol or use drugs.   Family History: The patient's family history includes Arthritis in her father and mother; Cancer in her father and mother; Coronary artery disease in her brother; Diabetes in her brother; Heart attack in her brother; Hyperlipidemia in her mother; Hypertension in her brother, brother, and mother; Multiple sclerosis in her brother; Osteopenia in her mother; Stroke in her mother; Ulcers in her mother.   Review of Systems: Please see the history of present illness.   Otherwise, the review of systems is positive for none.   All other systems are reviewed and negative.   Physical Exam: VS:  BP 138/70 (BP Location: Left Arm, Patient Position: Sitting, Cuff Size: Normal)   Pulse 62   Ht 5' 3.5" (1.613 m)   Wt 126 lb 6.4 oz (57.3 kg)   BMI 22.04 kg/m  .  BMI Body mass index is 22.04 kg/m.  Wt Readings from Last 3 Encounters:  11/13/17 126 lb 6.4 oz (57.3 kg)  10/19/17 126 lb 6.4 oz (57.3 kg)  06/20/17 124 lb (56.2 kg)    General: Pleasant. Well developed, well nourished and in no acute distress.   HEENT: Normal.  Neck: Supple, no JVD, carotid bruits, or masses noted.  Cardiac: Regular rate and rhythm. No murmurs, rubs, or gallops. No edema.  Respiratory:  Lungs are clear to auscultation bilaterally with normal work of breathing.  GI: Soft and nontender.  MS: No deformity or atrophy. Gait and ROM intact.  Skin: Warm and dry. Color is normal.  Neuro:  Strength and sensation are intact and no gross focal deficits noted.  Psych: Alert, appropriate and with normal affect.   LABORATORY DATA:  EKG:  EKG is ordered today. This demonstrates NSR with 1st degree AV block.  Lab Results  Component Value Date   WBC 6.1 05/04/2017   HGB 14.1  05/04/2017   HCT 40.8 05/04/2017   PLT 201 05/04/2017   GLUCOSE 103 (H) 05/04/2017   CHOL 190 05/04/2017   TRIG 115 05/04/2017   HDL 68 05/04/2017   LDLDIRECT 133.0 02/05/2013   LDLCALC 99 05/04/2017   ALT 17 05/04/2017   AST 19 05/04/2017   NA 141 05/04/2017   K 4.2 05/04/2017   CL 103 05/04/2017   CREATININE 0.83 05/04/2017   BUN 16 05/04/2017   CO2 20 05/04/2017   TSH 3.440 05/04/2017  BNP (last 3 results) No results for input(s): BNP in the last 8760 hours.  ProBNP (last 3 results) No results for input(s): PROBNP in the last 8760 hours.   Other Studies Reviewed Today:  Echo Study Conclusions 09/2017  - Left ventricle: The cavity size was normal. There was mild focal   basal hypertrophy of the septum. Systolic function was normal.   The estimated ejection fraction was in the range of 55% to 60%.   Wall motion was normal; there were no regional wall motion   abnormalities. Doppler parameters are consistent with abnormal   left ventricular relaxation (grade 1 diastolic dysfunction). - Aortic valve: There was trivial regurgitation. - Mitral valve: Mild prolapse, involving the anterior leaflet and   the posterior leaflet. - Left atrium: The atrium was mildly dilated.    Echo Study Conclusions 06/2017  - Left ventricle: The cavity size was normal. Systolic function was   normal. The estimated ejection fraction was in the range of 55%   to 60%. Wall motion was normal; there were no regional wall   motion abnormalities. Features are consistent with a pseudonormal   left ventricular filling pattern, with concomitant abnormal   relaxation and increased filling pressure (grade 2 diastolic   dysfunction). Doppler parameters are consistent with high   ventricular filling pressure. - Mitral valve: Mild, late systolicprolapse, involving the anterior   leaflet and the posterior leaflet. There was trivial   regurgitation. - Left atrium: The atrium was mildly  dilated. - Right atrium: The atrium was mildly dilated. - Pulmonary arteries: PA peak pressure: 48 mm Hg (S).  Impressions:  - The right ventricular systolic pressure was increased consistent   with moderate pulmonary hypertension. Notes recorded by Burtis Junes, NP on 06/13/2017 at 3:15 PM EST I have called [facility] with the echo report. I have also had Dr. Acie Fredrickson review - he also feels she has mild MR and that the pulmonary HTN looks real.  She is having NO shortness of breath.  Will refer to pulmonary for further evaluation - Dr. Lamonte Sakai only.        GXT Study Highlights from 11/2015    Negative GXT  The patient walked for 6 minutes on a Bruce protocol GXT. The peak H R was 149 which is 97% predicted maximal HR . There were no ST or T wave changes to suggest ischemia.  Normal BP response  The patient walked for a total of 6 minutes on a Bruce protocol GXT. The patient achieved a peak HR of 149 which is 97% predicted maximal HR. There were no ST changes to suggest ischemia. The BP response to exercise was normal.      Echo Study Conclusions 06/2015  - Left ventricle: The cavity size was normal. Wall thickness was normal. Systolic function was normal. The estimated ejection fraction was in the range of 55% to 60%. Wall motion was normal; there were no regional wall motion abnormalities. Doppler parameters are consistent with abnormal left ventricular relaxation (grade 1 diastolic dysfunction). - Aortic valve: There was mild regurgitation. - Mitral valve: Moderate prolapse, involving the anterior leaflet and the posterior leaflet. There was mild regurgitation.  Impressions:  - Prior ECHO 2005 - Mild MVP, EF 55%  Assessment/Plan:  1. Situational stress - still the primary crux/issue. Her situation is quite challenging.   2. MVP with mild MR from prior echo - recent echos noted.   3. Palpitations - not really endorsed.   4. HLD -  labs  from PCP noted.   5. Concern for pulmonary HTN - repeat echo did not demonstrate this - will plan to repeat her study in one year. She currently has no symptoms.   Current medicines are reviewed with the patient today.  The patient does not have concerns regarding medicines other than what has been noted above.  The following changes have been made:  See above.  Labs/ tests ordered today include:    Orders Placed This Encounter  Procedures  . EKG 12-Lead     Disposition:   FU with me in 6 months.   Patient is agreeable to this plan and will call if any problems develop in the interim.   SignedTruitt Merle, NP  11/13/2017 10:16 AM  [place] [address] [place], [place]  [address] Phone: [phone] Fax: [phone]

## 2017-11-20 DIAGNOSIS — M9901 Segmental and somatic dysfunction of cervical region: Secondary | ICD-10-CM | POA: Diagnosis not present

## 2017-11-20 DIAGNOSIS — M9902 Segmental and somatic dysfunction of thoracic region: Secondary | ICD-10-CM | POA: Diagnosis not present

## 2017-11-20 DIAGNOSIS — M542 Cervicalgia: Secondary | ICD-10-CM | POA: Diagnosis not present

## 2017-11-20 DIAGNOSIS — M4712 Other spondylosis with myelopathy, cervical region: Secondary | ICD-10-CM | POA: Diagnosis not present

## 2017-11-22 DIAGNOSIS — M542 Cervicalgia: Secondary | ICD-10-CM | POA: Diagnosis not present

## 2017-11-22 DIAGNOSIS — M9901 Segmental and somatic dysfunction of cervical region: Secondary | ICD-10-CM | POA: Diagnosis not present

## 2017-11-22 DIAGNOSIS — M4712 Other spondylosis with myelopathy, cervical region: Secondary | ICD-10-CM | POA: Diagnosis not present

## 2017-11-22 DIAGNOSIS — M9902 Segmental and somatic dysfunction of thoracic region: Secondary | ICD-10-CM | POA: Diagnosis not present

## 2017-11-27 DIAGNOSIS — M4712 Other spondylosis with myelopathy, cervical region: Secondary | ICD-10-CM | POA: Diagnosis not present

## 2017-11-27 DIAGNOSIS — M542 Cervicalgia: Secondary | ICD-10-CM | POA: Diagnosis not present

## 2017-11-27 DIAGNOSIS — M9901 Segmental and somatic dysfunction of cervical region: Secondary | ICD-10-CM | POA: Diagnosis not present

## 2017-11-27 DIAGNOSIS — M9902 Segmental and somatic dysfunction of thoracic region: Secondary | ICD-10-CM | POA: Diagnosis not present

## 2017-11-29 DIAGNOSIS — M9902 Segmental and somatic dysfunction of thoracic region: Secondary | ICD-10-CM | POA: Diagnosis not present

## 2017-11-29 DIAGNOSIS — M542 Cervicalgia: Secondary | ICD-10-CM | POA: Diagnosis not present

## 2017-11-29 DIAGNOSIS — M4712 Other spondylosis with myelopathy, cervical region: Secondary | ICD-10-CM | POA: Diagnosis not present

## 2017-11-29 DIAGNOSIS — M9901 Segmental and somatic dysfunction of cervical region: Secondary | ICD-10-CM | POA: Diagnosis not present

## 2017-12-11 DIAGNOSIS — M542 Cervicalgia: Secondary | ICD-10-CM | POA: Diagnosis not present

## 2017-12-11 DIAGNOSIS — M9902 Segmental and somatic dysfunction of thoracic region: Secondary | ICD-10-CM | POA: Diagnosis not present

## 2017-12-11 DIAGNOSIS — M9901 Segmental and somatic dysfunction of cervical region: Secondary | ICD-10-CM | POA: Diagnosis not present

## 2017-12-11 DIAGNOSIS — M4712 Other spondylosis with myelopathy, cervical region: Secondary | ICD-10-CM | POA: Diagnosis not present

## 2017-12-13 DIAGNOSIS — M5013 Cervical disc disorder with radiculopathy, cervicothoracic region: Secondary | ICD-10-CM | POA: Diagnosis not present

## 2017-12-13 DIAGNOSIS — M9901 Segmental and somatic dysfunction of cervical region: Secondary | ICD-10-CM | POA: Diagnosis not present

## 2017-12-13 DIAGNOSIS — M5413 Radiculopathy, cervicothoracic region: Secondary | ICD-10-CM | POA: Diagnosis not present

## 2017-12-13 DIAGNOSIS — M542 Cervicalgia: Secondary | ICD-10-CM | POA: Diagnosis not present

## 2017-12-18 DIAGNOSIS — I872 Venous insufficiency (chronic) (peripheral): Secondary | ICD-10-CM | POA: Diagnosis not present

## 2017-12-18 DIAGNOSIS — I272 Pulmonary hypertension, unspecified: Secondary | ICD-10-CM | POA: Diagnosis not present

## 2017-12-18 DIAGNOSIS — E7849 Other hyperlipidemia: Secondary | ICD-10-CM | POA: Diagnosis not present

## 2017-12-18 DIAGNOSIS — I341 Nonrheumatic mitral (valve) prolapse: Secondary | ICD-10-CM | POA: Diagnosis not present

## 2018-01-28 DIAGNOSIS — K5730 Diverticulosis of large intestine without perforation or abscess without bleeding: Secondary | ICD-10-CM | POA: Diagnosis not present

## 2018-01-28 DIAGNOSIS — Z1211 Encounter for screening for malignant neoplasm of colon: Secondary | ICD-10-CM | POA: Diagnosis not present

## 2018-02-21 DIAGNOSIS — Z01419 Encounter for gynecological examination (general) (routine) without abnormal findings: Secondary | ICD-10-CM | POA: Diagnosis not present

## 2018-02-21 DIAGNOSIS — N951 Menopausal and female climacteric states: Secondary | ICD-10-CM | POA: Diagnosis not present

## 2018-02-21 DIAGNOSIS — Z6821 Body mass index (BMI) 21.0-21.9, adult: Secondary | ICD-10-CM | POA: Diagnosis not present

## 2018-04-22 DIAGNOSIS — D0362 Melanoma in situ of left upper limb, including shoulder: Secondary | ICD-10-CM | POA: Diagnosis not present

## 2018-04-22 DIAGNOSIS — I8311 Varicose veins of right lower extremity with inflammation: Secondary | ICD-10-CM | POA: Diagnosis not present

## 2018-04-22 DIAGNOSIS — I872 Venous insufficiency (chronic) (peripheral): Secondary | ICD-10-CM | POA: Diagnosis not present

## 2018-04-22 DIAGNOSIS — Z85828 Personal history of other malignant neoplasm of skin: Secondary | ICD-10-CM | POA: Diagnosis not present

## 2018-04-22 DIAGNOSIS — I8312 Varicose veins of left lower extremity with inflammation: Secondary | ICD-10-CM | POA: Diagnosis not present

## 2018-05-07 DIAGNOSIS — M5013 Cervical disc disorder with radiculopathy, cervicothoracic region: Secondary | ICD-10-CM | POA: Diagnosis not present

## 2018-05-07 DIAGNOSIS — M5413 Radiculopathy, cervicothoracic region: Secondary | ICD-10-CM | POA: Diagnosis not present

## 2018-05-07 DIAGNOSIS — M9901 Segmental and somatic dysfunction of cervical region: Secondary | ICD-10-CM | POA: Diagnosis not present

## 2018-05-07 DIAGNOSIS — M542 Cervicalgia: Secondary | ICD-10-CM | POA: Diagnosis not present

## 2018-05-09 DIAGNOSIS — M5013 Cervical disc disorder with radiculopathy, cervicothoracic region: Secondary | ICD-10-CM | POA: Diagnosis not present

## 2018-05-09 DIAGNOSIS — M542 Cervicalgia: Secondary | ICD-10-CM | POA: Diagnosis not present

## 2018-05-09 DIAGNOSIS — M5413 Radiculopathy, cervicothoracic region: Secondary | ICD-10-CM | POA: Diagnosis not present

## 2018-05-09 DIAGNOSIS — M9901 Segmental and somatic dysfunction of cervical region: Secondary | ICD-10-CM | POA: Diagnosis not present

## 2018-05-13 DIAGNOSIS — D0362 Melanoma in situ of left upper limb, including shoulder: Secondary | ICD-10-CM | POA: Diagnosis not present

## 2018-05-13 DIAGNOSIS — Z85828 Personal history of other malignant neoplasm of skin: Secondary | ICD-10-CM | POA: Diagnosis not present

## 2018-05-13 DIAGNOSIS — L988 Other specified disorders of the skin and subcutaneous tissue: Secondary | ICD-10-CM | POA: Diagnosis not present

## 2018-05-14 NOTE — Progress Notes (Signed)
CARDIOLOGY OFFICE NOTE  Date:  05/15/2018    Shannon Brennan Date of Birth: 02-Dec-1945 Medical Record #[id number]  PCP:  Prince Solian, MD  Cardiologist:  Servando Snare    Chief Complaint  Patient presents with  . Follow-up    History of Present Illness: Shannon Brennan is a 73 y.o. female who presents today for a follow up visit. Former patient of Dr. Sherryl Barters. She is following with me.   She has a history of MVP, palpitations, HLD, and a history of hepatic hemangiomas. Last echo dates back to 2017- had MVP with mild MR noted. She does not have known CAD.   Shannon Brennan melastFebruaryof 2017-lots of stress - had moved in with her 2 year old mother. Has had issues with tolerating statin in the past.Whenseen back in August - had gotten some restand had gotten her other siblings to start helping more with their mom- but this did not last. GXT obtained due to orthostasis with exercise - this turned out ok. I saw her back in [place] she was doing ok - still with lots of stress - her mom was 99. Her brother had died.  Seen in 2016-12-10 - doing ok from our standpoint. Her mother still requiring lots of care. Still with lots of angst with having to be the primary care giver. Chronically does not get enough rest.When seen in January of 2019 - she had had a spell during the snow the month prior - BP elevated - took aspirin and extra atenolol. Stress level just incredible despite having 24 hour around the clock care. We got her echo updated - PAP elevated with pulmonary HTN noted - referred to pulmonary - repeat study however looked completely normal. She was never treated. Both echos were reviewed by multiple providers as well. At her last visit with me in 11-Dec-2022 - she was ok - stress remained the primary issue.   Comes back today. Here alone.She is doing ok. Her fatigue is manageable. Her breathing is ok. No chest pain. Stress remains a big issue - but she seems more  at peace with taking care of her mom - she will be 101 next month. Her mom is now on Hospice services. She will have her physical in March but is fasting for labs today. Overall, she feels she is ok and has no specific concerns.    Past Medical History:  Diagnosis Date  . Allergic rhinitis   . Arthritis    mild in right thumb  . Diverticulosis   . Family history of cardiovascular disease   . Hepatic hemangioma    history of -- followe by Dr. Cristina Gong  . Hx of osteopenia   . Hyperlipidemia    tolerates low dose Lipitor  . Hypertension    controlled with atenolol  . Insomnia   . Mitral valve prolapse    last echo in 2005 showing mild MVP with normal LV function  . Normal nuclear stress test 2006  . Palpitations   . Pyuria   . UTI (urinary tract infection)   . Vaginal atrophy     Past Surgical History:  Procedure Laterality Date  . ABDOMINAL HYSTERECTOMY    . LUMBAR LAMINECTOMY/DECOMPRESSION MICRODISCECTOMY  01/04/2012   Procedure: LUMBAR LAMINECTOMY/DECOMPRESSION MICRODISCECTOMY 1 LEVEL;  Surgeon: Kristeen Miss, MD;  Location: [facility];  Service: Neurosurgery;  Laterality: Left;  Left Lumbar Five-Sacral One Microdiskectomy  . PARTIAL HYSTERECTOMY    . TONSILLECTOMY  Medications: Current Meds  Medication Sig  . atenolol (TENORMIN) 25 MG tablet TAKE 1/2 TABLET(12.5 MG) BY MOUTH DAILY  . atorvastatin (LIPITOR) 10 MG tablet Take 0.5 tablets (5 mg total) by mouth 3 (three) times a week.  . cetirizine (ZYRTEC) 5 MG tablet Take 5 mg by mouth daily.  . Estradiol (YUVAFEM) 10 MCG TABS vaginal tablet Yuvafem 10 mcg vaginal tablet  INSERT 1 T  VAGINALLY TWICE A WEEK  . Nutritional Supplements (MELATONIN PO) Take 5 mg by mouth at bedtime.   . Probiotic Product (ALIGN PO) Take by mouth.  . traZODone (DESYREL) 50 MG tablet Take 50 mg by mouth at bedtime.     Allergies: Allergies  Allergen Reactions  . Erythromycin Diarrhea  . Flagyl [Metronidazole Hcl] Nausea Only  .  Levofloxacin Other (See Comments)    Headache  . Wellbutrin [Bupropion Hcl] Other (See Comments)    Dizziness  . Zoloft [Sertraline Hcl]     [employer]   . Restoril Palpitations    Social History: The patient  reports that she has never smoked. She has never used smokeless tobacco. She reports that she does not drink alcohol or use drugs.   Family History: The patient's family history includes Arthritis in her father and mother; Cancer in her father and mother; Coronary artery disease in her brother; Diabetes in her brother; Heart attack in her brother; Hyperlipidemia in her mother; Hypertension in her brother, brother, and mother; Multiple sclerosis in her brother; Osteopenia in her mother; Stroke in her mother; Ulcers in her mother.   Review of Systems: Please see the history of present illness.   Otherwise, the review of systems is positive for none.   All other systems are reviewed and negative.   Physical Exam: VS:  BP 122/78 (BP Location: Left Arm, Patient Position: Sitting, Cuff Size: Normal)   Pulse 68   Ht 5' 3.75" (1.619 m)   Wt 124 lb 12.8 oz (56.6 kg)   SpO2 100% Comment: at rest  BMI 21.59 kg/m  .  BMI Body mass index is 21.59 kg/m.  Wt Readings from Last 3 Encounters:  05/15/18 124 lb 12.8 oz (56.6 kg)  11/13/17 126 lb 6.4 oz (57.3 kg)  10/19/17 126 lb 6.4 oz (57.3 kg)    General: Pleasant. Well developed, well nourished and in no acute distress.   HEENT: Normal.  Neck: Supple, no JVD, carotid bruits, or masses noted.  Cardiac: Regular rate and rhythm. No murmurs, rubs, or gallops. No edema.  Respiratory:  Lungs are clear to auscultation bilaterally with normal work of breathing.  GI: Soft and nontender.  MS: No deformity or atrophy. Gait and ROM intact.  Skin: Warm and dry. Color is normal.  Neuro:  Strength and sensation are intact and no gross focal deficits noted.  Psych: Alert, appropriate and with normal affect.   LABORATORY DATA:  EKG:  EKG is not  ordered today.  Lab Results  Component Value Date   WBC 6.1 05/04/2017   HGB 14.1 05/04/2017   HCT 40.8 05/04/2017   PLT 201 05/04/2017   GLUCOSE 103 (H) 05/04/2017   CHOL 190 05/04/2017   TRIG 115 05/04/2017   HDL 68 05/04/2017   LDLDIRECT 133.0 02/05/2013   LDLCALC 99 05/04/2017   ALT 17 05/04/2017   AST 19 05/04/2017   NA 141 05/04/2017   K 4.2 05/04/2017   CL 103 05/04/2017   CREATININE 0.83 05/04/2017   BUN 16 05/04/2017   CO2 20 05/04/2017  TSH 3.440 05/04/2017       BNP (last 3 results) No results for input(s): BNP in the last 8760 hours.  ProBNP (last 3 results) No results for input(s): PROBNP in the last 8760 hours.   Other Studies Reviewed Today:  Echo Study Conclusions 09/2017  - Left ventricle: The cavity size was normal. There was mild focal basal hypertrophy of the septum. Systolic function was normal. The estimated ejection fraction was in the range of 55% to 60%. Wall motion was normal; there were no regional wall motion abnormalities. Doppler parameters are consistent with abnormal left ventricular relaxation (grade 1 diastolic dysfunction). - Aortic valve: There was trivial regurgitation. - Mitral valve: Mild prolapse, involving the anterior leaflet and the posterior leaflet. - Left atrium: The atrium was mildly dilated.    Echo Study Conclusions 06/2017  - Left ventricle: The cavity size was normal. Systolic function was normal. The estimated ejection fraction was in the range of 55% to 60%. Wall motion was normal; there were no regional wall motion abnormalities. Features are consistent with a pseudonormal left ventricular filling pattern, with concomitant abnormal relaxation and increased filling pressure (grade 2 diastolic dysfunction). Doppler parameters are consistent with high ventricular filling pressure. - Mitral valve: Mild, late systolicprolapse, involving the anterior leaflet and the posterior  leaflet. There was trivial regurgitation. - Left atrium: The atrium was mildly dilated. - Right atrium: The atrium was mildly dilated. - Pulmonary arteries: PA peak pressure: 48 mm Hg (S).  Impressions:  - The right ventricular systolic pressure was increased consistent with moderate pulmonary hypertension. Notes recorded by Burtis Junes, NP on 06/13/2017 at 3:15 PM EST I have called [facility] with the echo report. I have also had Dr. Acie Fredrickson review - he also feels she has mild MR and that the pulmonary HTN looks real.  She is having NO shortness of breath.  Will refer to pulmonary for further evaluation - Dr. Lamonte Sakai only.        GXT Study Highlights from 11/2015    Negative GXT  The patient walked for 6 minutes on a Bruce protocol GXT. The peak H R was 149 which is 97% predicted maximal HR . There were no ST or T wave changes to suggest ischemia.  Normal BP response  The patient walked for a total of 6 minutes on a Bruce protocol GXT. The patient achieved a peak HR of 149 which is 97% predicted maximal HR. There were no ST changes to suggest ischemia. The BP response to exercise was normal.      Echo Study Conclusions 06/2015  - Left ventricle: The cavity size was normal. Wall thickness was normal. Systolic function was normal. The estimated ejection fraction was in the range of 55% to 60%. Wall motion was normal; there were no regional wall motion abnormalities. Doppler parameters are consistent with abnormal left ventricular relaxation (grade 1 diastolic dysfunction). - Aortic valve: There was mild regurgitation. - Mitral valve: Moderate prolapse, involving the anterior leaflet and the posterior leaflet. There was mild regurgitation.    Assessment/Plan:  1. Situational stress - still the primary crux/issue. Her situation is quite challenging but she seems more at peace with this.   2. MVP with mild MR from prior echo - recent  echos noted. Would plan to repeat later this summer.   3. Palpitations - not really endorsed.   4. HLD -checking labs today.   5. Concern for pulmonary HTN - repeat echo did not demonstrate this - will plan  to repeat her study in one year - summer of 2020. She currently has no symptoms.    Current medicines are reviewed with the patient today.  The patient does not have concerns regarding medicines other than what has been noted above.  The following changes have been made:  See above.  Labs/ tests ordered today include:    Orders Placed This Encounter  Procedures  . Basic metabolic panel  . CBC  . Hepatic function panel  . Lipid panel  . TSH     Disposition:   FU with me in 6 months.   Patient is agreeable to this plan and will call if any problems develop in the interim.   SignedTruitt Merle, NP  05/15/2018 9:46 AM  [facility] [address] [place], [place]  [address] Phone: [phone] Fax: [phone]

## 2018-05-15 ENCOUNTER — Ambulatory Visit: Payer: Medicare Other | Admitting: Nurse Practitioner

## 2018-05-15 ENCOUNTER — Encounter: Payer: Self-pay | Admitting: Nurse Practitioner

## 2018-05-15 ENCOUNTER — Telehealth: Payer: Self-pay | Admitting: *Deleted

## 2018-05-15 VITALS — BP 122/78 | HR 68 | Ht 63.75 in | Wt 124.8 lb

## 2018-05-15 DIAGNOSIS — R002 Palpitations: Secondary | ICD-10-CM

## 2018-05-15 DIAGNOSIS — I272 Pulmonary hypertension, unspecified: Secondary | ICD-10-CM | POA: Diagnosis not present

## 2018-05-15 DIAGNOSIS — I341 Nonrheumatic mitral (valve) prolapse: Secondary | ICD-10-CM | POA: Diagnosis not present

## 2018-05-15 DIAGNOSIS — E7800 Pure hypercholesterolemia, unspecified: Secondary | ICD-10-CM

## 2018-05-15 NOTE — Telephone Encounter (Signed)
Calling [facility] [place] @ [phone] in [place], [place] in this pt's chart under medication under reconciliation, lots of medication keep appearing that pt has never taken before. This is the second time this has happened.  Wanted clarification??  Will try later.

## 2018-05-15 NOTE — Patient Instructions (Addendum)
We will be checking the following labs today - BMET, CBC, HPF, Lipids and TSh  If you have labs (blood work) drawn today and your tests are completely normal, you will receive your results only by: Marland Kitchen MyChart Message (if you have MyChart) OR . A paper copy in the mail If you have any lab test that is abnormal or we need to change your treatment, we will call you to review the results.   Medication Instructions:    Continue with your current medicines.    If you need a refill on your cardiac medications before your next appointment, please call your pharmacy.     Testing/Procedures To Be Arranged:  N/A  Follow-Up:   See me in 6 months    At [facility], you and your health needs are our priority.  As part of our continuing mission to provide you with exceptional heart care, we have created designated Provider Care Teams.  These Care Teams include your primary Cardiologist (physician) and Advanced Practice Providers (APPs -  Physician Assistants and Nurse Practitioners) who all work together to provide you with the care you need, when you need it.  Special Instructions:  . None  Call the [place] office at [phone] if you have any questions, problems or concerns.

## 2018-05-16 DIAGNOSIS — M9901 Segmental and somatic dysfunction of cervical region: Secondary | ICD-10-CM | POA: Diagnosis not present

## 2018-05-16 DIAGNOSIS — M542 Cervicalgia: Secondary | ICD-10-CM | POA: Diagnosis not present

## 2018-05-16 DIAGNOSIS — M5013 Cervical disc disorder with radiculopathy, cervicothoracic region: Secondary | ICD-10-CM | POA: Diagnosis not present

## 2018-05-16 DIAGNOSIS — M5413 Radiculopathy, cervicothoracic region: Secondary | ICD-10-CM | POA: Diagnosis not present

## 2018-05-16 LAB — BASIC METABOLIC PANEL
BUN/Creatinine Ratio: 21 (ref 12–28)
BUN: 18 mg/dL (ref 8–27)
CO2: 24 mmol/L (ref 20–29)
Calcium: 9.6 mg/dL (ref 8.7–10.3)
Chloride: 102 mmol/L (ref 96–106)
Creatinine, Ser: 0.86 mg/dL (ref 0.57–1.00)
GFR calc Af Amer: 78 mL/min/{1.73_m2} (ref >59)
GFR calc non Af Amer: 67 mL/min/{1.73_m2} (ref >59)
Glucose: 99 mg/dL (ref 65–99)
Potassium: 4.4 mmol/L (ref 3.5–5.2)
Sodium: 142 mmol/L (ref 134–144)

## 2018-05-16 LAB — CBC
Hematocrit: 39.2 % (ref 34.0–46.6)
Hemoglobin: 13.5 g/dL (ref 11.1–15.9)
MCH: 29.6 pg (ref 26.6–33.0)
MCHC: 34.4 g/dL (ref 31.5–35.7)
MCV: 86 fL (ref 79–97)
Platelets: 223 10*3/uL (ref 150–450)
RBC: 4.56 x10E6/uL (ref 3.77–5.28)
RDW: 13.1 % (ref 11.7–15.4)
WBC: 6.4 10*3/uL (ref 3.4–10.8)

## 2018-05-16 LAB — HEPATIC FUNCTION PANEL
ALT: 15 IU/L (ref 0–32)
AST: 17 IU/L (ref 0–40)
Albumin: 4.7 g/dL (ref 3.5–4.8)
Alkaline Phosphatase: 60 IU/L (ref 39–117)
Bilirubin Total: 0.8 mg/dL (ref 0.0–1.2)
Bilirubin, Direct: 0.18 mg/dL (ref 0.00–0.40)
Total Protein: 6.7 g/dL (ref 6.0–8.5)

## 2018-05-16 LAB — TSH: TSH: 2.5 u[IU]/mL (ref 0.450–4.500)

## 2018-05-16 LAB — LIPID PANEL
Chol/HDL Ratio: 2.7 ratio (ref 0.0–4.4)
Cholesterol, Total: 180 mg/dL (ref 100–199)
HDL: 67 mg/dL (ref >39)
LDL Calculated: 94 mg/dL (ref 0–99)
Triglycerides: 97 mg/dL (ref 0–149)
VLDL Cholesterol Cal: 19 mg/dL (ref 5–40)

## 2018-05-17 NOTE — Telephone Encounter (Signed)
S/w Shannon Brennan from [employer] and stated pt goes to [facility] [place] [facility] whom is affiliated with [facility] and pt needs to contact that office at [phone] to see why these medications are appearing under pt's chart. Will call pt and advise.

## 2018-05-17 NOTE — Telephone Encounter (Signed)
S/w pt is aware of previous phone note and will be following up with pt's OBGYN office on medication reconciliation.

## 2018-05-20 DIAGNOSIS — M5013 Cervical disc disorder with radiculopathy, cervicothoracic region: Secondary | ICD-10-CM | POA: Diagnosis not present

## 2018-05-20 DIAGNOSIS — M542 Cervicalgia: Secondary | ICD-10-CM | POA: Diagnosis not present

## 2018-05-20 DIAGNOSIS — M5413 Radiculopathy, cervicothoracic region: Secondary | ICD-10-CM | POA: Diagnosis not present

## 2018-05-20 DIAGNOSIS — M9901 Segmental and somatic dysfunction of cervical region: Secondary | ICD-10-CM | POA: Diagnosis not present

## 2018-05-28 DIAGNOSIS — M5013 Cervical disc disorder with radiculopathy, cervicothoracic region: Secondary | ICD-10-CM | POA: Diagnosis not present

## 2018-05-28 DIAGNOSIS — M542 Cervicalgia: Secondary | ICD-10-CM | POA: Diagnosis not present

## 2018-05-28 DIAGNOSIS — M5413 Radiculopathy, cervicothoracic region: Secondary | ICD-10-CM | POA: Diagnosis not present

## 2018-05-28 DIAGNOSIS — M9901 Segmental and somatic dysfunction of cervical region: Secondary | ICD-10-CM | POA: Diagnosis not present

## 2018-06-03 DIAGNOSIS — M5013 Cervical disc disorder with radiculopathy, cervicothoracic region: Secondary | ICD-10-CM | POA: Diagnosis not present

## 2018-06-03 DIAGNOSIS — M9901 Segmental and somatic dysfunction of cervical region: Secondary | ICD-10-CM | POA: Diagnosis not present

## 2018-06-03 DIAGNOSIS — M5413 Radiculopathy, cervicothoracic region: Secondary | ICD-10-CM | POA: Diagnosis not present

## 2018-06-03 DIAGNOSIS — M542 Cervicalgia: Secondary | ICD-10-CM | POA: Diagnosis not present

## 2018-06-10 ENCOUNTER — Other Ambulatory Visit: Payer: Self-pay | Admitting: Nurse Practitioner

## 2018-06-13 DIAGNOSIS — M5413 Radiculopathy, cervicothoracic region: Secondary | ICD-10-CM | POA: Diagnosis not present

## 2018-06-13 DIAGNOSIS — M542 Cervicalgia: Secondary | ICD-10-CM | POA: Diagnosis not present

## 2018-06-13 DIAGNOSIS — M9901 Segmental and somatic dysfunction of cervical region: Secondary | ICD-10-CM | POA: Diagnosis not present

## 2018-06-13 DIAGNOSIS — M5013 Cervical disc disorder with radiculopathy, cervicothoracic region: Secondary | ICD-10-CM | POA: Diagnosis not present

## 2018-06-17 DIAGNOSIS — M542 Cervicalgia: Secondary | ICD-10-CM | POA: Diagnosis not present

## 2018-06-17 DIAGNOSIS — M5013 Cervical disc disorder with radiculopathy, cervicothoracic region: Secondary | ICD-10-CM | POA: Diagnosis not present

## 2018-06-17 DIAGNOSIS — M9901 Segmental and somatic dysfunction of cervical region: Secondary | ICD-10-CM | POA: Diagnosis not present

## 2018-06-17 DIAGNOSIS — M5413 Radiculopathy, cervicothoracic region: Secondary | ICD-10-CM | POA: Diagnosis not present

## 2018-07-09 DIAGNOSIS — M542 Cervicalgia: Secondary | ICD-10-CM | POA: Diagnosis not present

## 2018-07-09 DIAGNOSIS — M5013 Cervical disc disorder with radiculopathy, cervicothoracic region: Secondary | ICD-10-CM | POA: Diagnosis not present

## 2018-07-09 DIAGNOSIS — M9901 Segmental and somatic dysfunction of cervical region: Secondary | ICD-10-CM | POA: Diagnosis not present

## 2018-07-09 DIAGNOSIS — M5413 Radiculopathy, cervicothoracic region: Secondary | ICD-10-CM | POA: Diagnosis not present

## 2018-07-10 DIAGNOSIS — E7849 Other hyperlipidemia: Secondary | ICD-10-CM | POA: Diagnosis not present

## 2018-07-10 DIAGNOSIS — R82998 Other abnormal findings in urine: Secondary | ICD-10-CM | POA: Diagnosis not present

## 2018-07-10 DIAGNOSIS — M810 Age-related osteoporosis without current pathological fracture: Secondary | ICD-10-CM | POA: Diagnosis not present

## 2018-07-11 DIAGNOSIS — M5013 Cervical disc disorder with radiculopathy, cervicothoracic region: Secondary | ICD-10-CM | POA: Diagnosis not present

## 2018-07-11 DIAGNOSIS — M5413 Radiculopathy, cervicothoracic region: Secondary | ICD-10-CM | POA: Diagnosis not present

## 2018-07-11 DIAGNOSIS — M9901 Segmental and somatic dysfunction of cervical region: Secondary | ICD-10-CM | POA: Diagnosis not present

## 2018-07-11 DIAGNOSIS — M542 Cervicalgia: Secondary | ICD-10-CM | POA: Diagnosis not present

## 2018-07-15 DIAGNOSIS — M9901 Segmental and somatic dysfunction of cervical region: Secondary | ICD-10-CM | POA: Diagnosis not present

## 2018-07-15 DIAGNOSIS — M5013 Cervical disc disorder with radiculopathy, cervicothoracic region: Secondary | ICD-10-CM | POA: Diagnosis not present

## 2018-07-15 DIAGNOSIS — M5413 Radiculopathy, cervicothoracic region: Secondary | ICD-10-CM | POA: Diagnosis not present

## 2018-07-15 DIAGNOSIS — M542 Cervicalgia: Secondary | ICD-10-CM | POA: Diagnosis not present

## 2018-07-17 DIAGNOSIS — I872 Venous insufficiency (chronic) (peripheral): Secondary | ICD-10-CM | POA: Diagnosis not present

## 2018-07-17 DIAGNOSIS — Z0000 Encounter for general adult medical examination without abnormal findings: Secondary | ICD-10-CM | POA: Diagnosis not present

## 2018-07-17 DIAGNOSIS — I341 Nonrheumatic mitral (valve) prolapse: Secondary | ICD-10-CM | POA: Diagnosis not present

## 2018-07-17 DIAGNOSIS — E7849 Other hyperlipidemia: Secondary | ICD-10-CM | POA: Diagnosis not present

## 2018-07-18 DIAGNOSIS — M9901 Segmental and somatic dysfunction of cervical region: Secondary | ICD-10-CM | POA: Diagnosis not present

## 2018-07-18 DIAGNOSIS — M542 Cervicalgia: Secondary | ICD-10-CM | POA: Diagnosis not present

## 2018-07-18 DIAGNOSIS — M5413 Radiculopathy, cervicothoracic region: Secondary | ICD-10-CM | POA: Diagnosis not present

## 2018-07-18 DIAGNOSIS — M5013 Cervical disc disorder with radiculopathy, cervicothoracic region: Secondary | ICD-10-CM | POA: Diagnosis not present

## 2018-07-23 DIAGNOSIS — M5013 Cervical disc disorder with radiculopathy, cervicothoracic region: Secondary | ICD-10-CM | POA: Diagnosis not present

## 2018-07-23 DIAGNOSIS — Z1212 Encounter for screening for malignant neoplasm of rectum: Secondary | ICD-10-CM | POA: Diagnosis not present

## 2018-07-23 DIAGNOSIS — M9901 Segmental and somatic dysfunction of cervical region: Secondary | ICD-10-CM | POA: Diagnosis not present

## 2018-07-23 DIAGNOSIS — M5413 Radiculopathy, cervicothoracic region: Secondary | ICD-10-CM | POA: Diagnosis not present

## 2018-07-23 DIAGNOSIS — M542 Cervicalgia: Secondary | ICD-10-CM | POA: Diagnosis not present

## 2018-08-06 DIAGNOSIS — M5013 Cervical disc disorder with radiculopathy, cervicothoracic region: Secondary | ICD-10-CM | POA: Diagnosis not present

## 2018-08-06 DIAGNOSIS — M542 Cervicalgia: Secondary | ICD-10-CM | POA: Diagnosis not present

## 2018-08-06 DIAGNOSIS — M5413 Radiculopathy, cervicothoracic region: Secondary | ICD-10-CM | POA: Diagnosis not present

## 2018-08-06 DIAGNOSIS — M9901 Segmental and somatic dysfunction of cervical region: Secondary | ICD-10-CM | POA: Diagnosis not present

## 2018-08-08 DIAGNOSIS — M542 Cervicalgia: Secondary | ICD-10-CM | POA: Diagnosis not present

## 2018-08-08 DIAGNOSIS — M5413 Radiculopathy, cervicothoracic region: Secondary | ICD-10-CM | POA: Diagnosis not present

## 2018-08-08 DIAGNOSIS — M5013 Cervical disc disorder with radiculopathy, cervicothoracic region: Secondary | ICD-10-CM | POA: Diagnosis not present

## 2018-08-08 DIAGNOSIS — M9901 Segmental and somatic dysfunction of cervical region: Secondary | ICD-10-CM | POA: Diagnosis not present

## 2018-08-13 DIAGNOSIS — M5413 Radiculopathy, cervicothoracic region: Secondary | ICD-10-CM | POA: Diagnosis not present

## 2018-08-13 DIAGNOSIS — M5013 Cervical disc disorder with radiculopathy, cervicothoracic region: Secondary | ICD-10-CM | POA: Diagnosis not present

## 2018-08-13 DIAGNOSIS — M9901 Segmental and somatic dysfunction of cervical region: Secondary | ICD-10-CM | POA: Diagnosis not present

## 2018-08-13 DIAGNOSIS — M542 Cervicalgia: Secondary | ICD-10-CM | POA: Diagnosis not present

## 2018-08-15 DIAGNOSIS — M542 Cervicalgia: Secondary | ICD-10-CM | POA: Diagnosis not present

## 2018-08-15 DIAGNOSIS — M9901 Segmental and somatic dysfunction of cervical region: Secondary | ICD-10-CM | POA: Diagnosis not present

## 2018-08-15 DIAGNOSIS — M5013 Cervical disc disorder with radiculopathy, cervicothoracic region: Secondary | ICD-10-CM | POA: Diagnosis not present

## 2018-08-15 DIAGNOSIS — M5413 Radiculopathy, cervicothoracic region: Secondary | ICD-10-CM | POA: Diagnosis not present

## 2018-08-20 DIAGNOSIS — M5413 Radiculopathy, cervicothoracic region: Secondary | ICD-10-CM | POA: Diagnosis not present

## 2018-08-20 DIAGNOSIS — M9901 Segmental and somatic dysfunction of cervical region: Secondary | ICD-10-CM | POA: Diagnosis not present

## 2018-08-20 DIAGNOSIS — M5013 Cervical disc disorder with radiculopathy, cervicothoracic region: Secondary | ICD-10-CM | POA: Diagnosis not present

## 2018-08-20 DIAGNOSIS — M542 Cervicalgia: Secondary | ICD-10-CM | POA: Diagnosis not present

## 2018-08-27 DIAGNOSIS — M5413 Radiculopathy, cervicothoracic region: Secondary | ICD-10-CM | POA: Diagnosis not present

## 2018-08-27 DIAGNOSIS — M9901 Segmental and somatic dysfunction of cervical region: Secondary | ICD-10-CM | POA: Diagnosis not present

## 2018-08-27 DIAGNOSIS — M5013 Cervical disc disorder with radiculopathy, cervicothoracic region: Secondary | ICD-10-CM | POA: Diagnosis not present

## 2018-08-27 DIAGNOSIS — M542 Cervicalgia: Secondary | ICD-10-CM | POA: Diagnosis not present

## 2018-08-29 DIAGNOSIS — M9901 Segmental and somatic dysfunction of cervical region: Secondary | ICD-10-CM | POA: Diagnosis not present

## 2018-08-29 DIAGNOSIS — M542 Cervicalgia: Secondary | ICD-10-CM | POA: Diagnosis not present

## 2018-08-29 DIAGNOSIS — M5413 Radiculopathy, cervicothoracic region: Secondary | ICD-10-CM | POA: Diagnosis not present

## 2018-08-29 DIAGNOSIS — M5013 Cervical disc disorder with radiculopathy, cervicothoracic region: Secondary | ICD-10-CM | POA: Diagnosis not present

## 2018-09-03 DIAGNOSIS — M9901 Segmental and somatic dysfunction of cervical region: Secondary | ICD-10-CM | POA: Diagnosis not present

## 2018-09-03 DIAGNOSIS — M542 Cervicalgia: Secondary | ICD-10-CM | POA: Diagnosis not present

## 2018-09-03 DIAGNOSIS — M5413 Radiculopathy, cervicothoracic region: Secondary | ICD-10-CM | POA: Diagnosis not present

## 2018-09-03 DIAGNOSIS — M5013 Cervical disc disorder with radiculopathy, cervicothoracic region: Secondary | ICD-10-CM | POA: Diagnosis not present

## 2018-09-05 DIAGNOSIS — M5413 Radiculopathy, cervicothoracic region: Secondary | ICD-10-CM | POA: Diagnosis not present

## 2018-09-05 DIAGNOSIS — M5013 Cervical disc disorder with radiculopathy, cervicothoracic region: Secondary | ICD-10-CM | POA: Diagnosis not present

## 2018-09-05 DIAGNOSIS — M9901 Segmental and somatic dysfunction of cervical region: Secondary | ICD-10-CM | POA: Diagnosis not present

## 2018-09-05 DIAGNOSIS — M542 Cervicalgia: Secondary | ICD-10-CM | POA: Diagnosis not present

## 2018-09-10 DIAGNOSIS — M5013 Cervical disc disorder with radiculopathy, cervicothoracic region: Secondary | ICD-10-CM | POA: Diagnosis not present

## 2018-09-10 DIAGNOSIS — M542 Cervicalgia: Secondary | ICD-10-CM | POA: Diagnosis not present

## 2018-09-10 DIAGNOSIS — M5413 Radiculopathy, cervicothoracic region: Secondary | ICD-10-CM | POA: Diagnosis not present

## 2018-09-10 DIAGNOSIS — M9901 Segmental and somatic dysfunction of cervical region: Secondary | ICD-10-CM | POA: Diagnosis not present

## 2018-09-12 DIAGNOSIS — M542 Cervicalgia: Secondary | ICD-10-CM | POA: Diagnosis not present

## 2018-09-12 DIAGNOSIS — M9901 Segmental and somatic dysfunction of cervical region: Secondary | ICD-10-CM | POA: Diagnosis not present

## 2018-09-12 DIAGNOSIS — M5413 Radiculopathy, cervicothoracic region: Secondary | ICD-10-CM | POA: Diagnosis not present

## 2018-09-12 DIAGNOSIS — M5013 Cervical disc disorder with radiculopathy, cervicothoracic region: Secondary | ICD-10-CM | POA: Diagnosis not present

## 2018-09-17 DIAGNOSIS — M5413 Radiculopathy, cervicothoracic region: Secondary | ICD-10-CM | POA: Diagnosis not present

## 2018-09-17 DIAGNOSIS — M542 Cervicalgia: Secondary | ICD-10-CM | POA: Diagnosis not present

## 2018-09-17 DIAGNOSIS — M5013 Cervical disc disorder with radiculopathy, cervicothoracic region: Secondary | ICD-10-CM | POA: Diagnosis not present

## 2018-09-17 DIAGNOSIS — M9901 Segmental and somatic dysfunction of cervical region: Secondary | ICD-10-CM | POA: Diagnosis not present

## 2018-09-19 DIAGNOSIS — M542 Cervicalgia: Secondary | ICD-10-CM | POA: Diagnosis not present

## 2018-09-19 DIAGNOSIS — M5013 Cervical disc disorder with radiculopathy, cervicothoracic region: Secondary | ICD-10-CM | POA: Diagnosis not present

## 2018-09-19 DIAGNOSIS — M9901 Segmental and somatic dysfunction of cervical region: Secondary | ICD-10-CM | POA: Diagnosis not present

## 2018-09-19 DIAGNOSIS — M5413 Radiculopathy, cervicothoracic region: Secondary | ICD-10-CM | POA: Diagnosis not present

## 2018-09-24 DIAGNOSIS — M542 Cervicalgia: Secondary | ICD-10-CM | POA: Diagnosis not present

## 2018-09-24 DIAGNOSIS — M5013 Cervical disc disorder with radiculopathy, cervicothoracic region: Secondary | ICD-10-CM | POA: Diagnosis not present

## 2018-09-24 DIAGNOSIS — M5413 Radiculopathy, cervicothoracic region: Secondary | ICD-10-CM | POA: Diagnosis not present

## 2018-09-24 DIAGNOSIS — M9901 Segmental and somatic dysfunction of cervical region: Secondary | ICD-10-CM | POA: Diagnosis not present

## 2018-09-26 DIAGNOSIS — M542 Cervicalgia: Secondary | ICD-10-CM | POA: Diagnosis not present

## 2018-09-26 DIAGNOSIS — M5013 Cervical disc disorder with radiculopathy, cervicothoracic region: Secondary | ICD-10-CM | POA: Diagnosis not present

## 2018-09-26 DIAGNOSIS — M5413 Radiculopathy, cervicothoracic region: Secondary | ICD-10-CM | POA: Diagnosis not present

## 2018-09-26 DIAGNOSIS — M9901 Segmental and somatic dysfunction of cervical region: Secondary | ICD-10-CM | POA: Diagnosis not present

## 2018-10-08 ENCOUNTER — Other Ambulatory Visit: Payer: Self-pay | Admitting: Gastroenterology

## 2018-10-08 DIAGNOSIS — R935 Abnormal findings on diagnostic imaging of other abdominal regions, including retroperitoneum: Secondary | ICD-10-CM

## 2018-10-17 DIAGNOSIS — Z85828 Personal history of other malignant neoplasm of skin: Secondary | ICD-10-CM | POA: Diagnosis not present

## 2018-10-17 DIAGNOSIS — D2262 Melanocytic nevi of left upper limb, including shoulder: Secondary | ICD-10-CM | POA: Diagnosis not present

## 2018-10-17 DIAGNOSIS — D2271 Melanocytic nevi of right lower limb, including hip: Secondary | ICD-10-CM | POA: Diagnosis not present

## 2018-10-17 DIAGNOSIS — Z85820 Personal history of malignant melanoma of skin: Secondary | ICD-10-CM | POA: Diagnosis not present

## 2018-10-17 DIAGNOSIS — D485 Neoplasm of uncertain behavior of skin: Secondary | ICD-10-CM | POA: Diagnosis not present

## 2018-10-17 DIAGNOSIS — D225 Melanocytic nevi of trunk: Secondary | ICD-10-CM | POA: Diagnosis not present

## 2018-10-29 ENCOUNTER — Ambulatory Visit
Admission: RE | Admit: 2018-10-29 | Discharge: 2018-10-29 | Disposition: A | Discharge status: Home / Self Care | Payer: Medicare Other | Source: Ambulatory Visit | Attending: Gastroenterology | Admitting: Gastroenterology

## 2018-10-29 DIAGNOSIS — K769 Liver disease, unspecified: Secondary | ICD-10-CM | POA: Diagnosis not present

## 2018-10-29 DIAGNOSIS — R935 Abnormal findings on diagnostic imaging of other abdominal regions, including retroperitoneum: Secondary | ICD-10-CM

## 2018-11-05 DIAGNOSIS — D1803 Hemangioma of intra-abdominal structures: Secondary | ICD-10-CM | POA: Diagnosis not present

## 2018-11-05 DIAGNOSIS — K824 Cholesterolosis of gallbladder: Secondary | ICD-10-CM | POA: Diagnosis not present

## 2018-11-06 DIAGNOSIS — Z1231 Encounter for screening mammogram for malignant neoplasm of breast: Secondary | ICD-10-CM | POA: Diagnosis not present

## 2018-11-09 NOTE — Progress Notes (Signed)
CARDIOLOGY OFFICE NOTE  Date:  11/11/2018    Shannon Brennan Date of Birth: 1945/08/11 Medical Record #[id number]  PCP:  Prince Solian, MD  Cardiologist:  Servando Snare  Chief Complaint  Patient presents with  . Hyperlipidemia  . Palpitations    6 month check.     History of Present Illness: Shannon Brennan is a 73 y.o. female who presents today for a 6 month check. Former patient of Dr. Sherryl Barters. She is following with me.   She has a history of MVP, palpitations, HLD, and a history of hepatic hemangiomas. Last echo dates back to 2017- had MVP with mild MR noted. She does not have known CAD.   Over the past several years, she was the primary caregiver for her 44 year old mother - had moved in with her - had very little to no help from siblings. Brother ended up dying. Lots of stress/angst with the caregiver role.   When seen in January of 2019 - she had had a spell during the snow the month prior - BP elevated - took aspirin and extra atenolol. Stress level just incredible despite having 24 hour around the clock care. We got her echo updated - PAP elevated with pulmonary HTN noted - referred to pulmonary - repeat study however looked completely normal. She wasnevertreated.Both echos were reviewed by multiple providers as well.At her last visit with me in January she was doing ok. Chronic fatigue but manageable. Seemed to be more at peace with taking care of her mom - who was soon to be 101 and had been put on Hospice.   The patient does not have symptoms concerning for COVID-19 infection (fever, chills, cough, or new shortness of breath).   Comes in today. Here alone. Several health issues. She has had follow up of her liver angiomas - now with gallbladder polyp - she is going to see Dr. Lucia Gaskins to discuss - worried about potential gallbladder cancer. Breast tissue has gotten more dense - considering MRI - remains on low dose HRT therapy. Dealing with her mom's estate and  son's ongoing divorce. Fortunately, feels ok. No chest pain. Breathing is good. Some palpitations but not too bothersome. Labs from March noted.   Past Medical History:  Diagnosis Date  . Allergic rhinitis   . Arthritis    mild in right thumb  . Diverticulosis   . Family history of cardiovascular disease   . Hepatic hemangioma    history of -- followe by Dr. Cristina Gong  . Hx of osteopenia   . Hyperlipidemia    tolerates low dose Lipitor  . Hypertension    controlled with atenolol  . Insomnia   . Mitral valve prolapse    last echo in 2005 showing mild MVP with normal LV function  . Normal nuclear stress test 2006  . Palpitations   . Pyuria   . UTI (urinary tract infection)   . Vaginal atrophy     Past Surgical History:  Procedure Laterality Date  . ABDOMINAL HYSTERECTOMY    . LUMBAR LAMINECTOMY/DECOMPRESSION MICRODISCECTOMY  01/04/2012   Procedure: LUMBAR LAMINECTOMY/DECOMPRESSION MICRODISCECTOMY 1 LEVEL;  Surgeon: Kristeen Miss, MD;  Location: [facility];  Service: Neurosurgery;  Laterality: Left;  Left Lumbar Five-Sacral One Microdiskectomy  . PARTIAL HYSTERECTOMY    . TONSILLECTOMY       Medications: Current Meds  Medication Sig  . atenolol (TENORMIN) 25 MG tablet TAKE 1/2 TABLET(12.5 MG) BY MOUTH DAILY  . atorvastatin (LIPITOR) 10  MG tablet Take 0.5 tablets (5 mg total) by mouth 3 (three) times a week.  . cetirizine (ZYRTEC) 5 MG tablet Take 5 mg by mouth daily.  . Estradiol (YUVAFEM) 10 MCG TABS vaginal tablet Yuvafem 10 mcg vaginal tablet  INSERT 1 T  VAGINALLY TWICE A WEEK  . Nutritional Supplements (MELATONIN PO) Take 5 mg by mouth at bedtime.   . Probiotic Product (ALIGN PO) Take by mouth.  . traZODone (DESYREL) 50 MG tablet Take 50 mg by mouth at bedtime.     Allergies: Allergies  Allergen Reactions  . Erythromycin Diarrhea  . Flagyl [Metronidazole Hcl] Nausea Only  . Levofloxacin Other (See Comments)    Headache  . Wellbutrin [Bupropion Hcl] Other (See  Comments)    Dizziness  . Zoloft [Sertraline Hcl]     [employer]   . Restoril Palpitations    Social History: The patient  reports that she has never smoked. She has never used smokeless tobacco. She reports that she does not drink alcohol or use drugs.   Family History: The patient's family history includes Arthritis in her father and mother; Cancer in her father and mother; Coronary artery disease in her brother; Diabetes in her brother; Heart attack in her brother; Hyperlipidemia in her mother; Hypertension in her brother, brother, and mother; Multiple sclerosis in her brother; Osteopenia in her mother; Stroke in her mother; Ulcers in her mother.   Review of Systems: Please see the history of present illness.   All other systems are reviewed and negative.   Physical Exam: VS:  BP 130/78   Pulse (!) 58   Ht 5' 3.5" (1.613 m)   Wt 126 lb 6.4 oz (57.3 kg)   SpO2 98%   BMI 22.04 kg/m  .  BMI Body mass index is 22.04 kg/m.  Wt Readings from Last 3 Encounters:  11/11/18 126 lb 6.4 oz (57.3 kg)  05/15/18 124 lb 12.8 oz (56.6 kg)  11/13/17 126 lb 6.4 oz (57.3 kg)    General: Pleasant. Well developed, well nourished and in no acute distress.   HEENT: Normal.  Neck: Supple, no JVD, carotid bruits, or masses noted.  Cardiac: Regular rate and rhythm. No murmurs, rubs, or gallops. No edema.  Respiratory:  Lungs are clear to auscultation bilaterally with normal work of breathing.  GI: Soft and nontender.  MS: No deformity or atrophy. Gait and ROM intact.  Skin: Warm and dry. Color is normal.  Neuro:  Strength and sensation are intact and no gross focal deficits noted.  Psych: Alert, appropriate and with normal affect.   LABORATORY DATA:  EKG:  EKG is ordered today. This shows sinus brady - HR 58 - PAC noted.   Lab Results  Component Value Date   WBC 6.4 05/15/2018   HGB 13.5 05/15/2018   HCT 39.2 05/15/2018   PLT 223 05/15/2018   GLUCOSE 99 05/15/2018   CHOL 180 05/15/2018    TRIG 97 05/15/2018   HDL 67 05/15/2018   LDLDIRECT 133.0 02/05/2013   LDLCALC 94 05/15/2018   ALT 15 05/15/2018   AST 17 05/15/2018   NA 142 05/15/2018   K 4.4 05/15/2018   CL 102 05/15/2018   CREATININE 0.86 05/15/2018   BUN 18 05/15/2018   CO2 24 05/15/2018   TSH 2.500 05/15/2018       BNP (last 3 results) No results for input(s): BNP in the last 8760 hours.  ProBNP (last 3 results) No results for input(s): PROBNP in the last 8760  hours.   Other Studies Reviewed Today:  EchoStudy Conclusions6/2019  - Left ventricle: The cavity size was normal. There was mild focal basal hypertrophy of the septum. Systolic function was normal. The estimated ejection fraction was in the range of 55% to 60%. Wall motion was normal; there were no regional wall motion abnormalities. Doppler parameters are consistent with abnormal left ventricular relaxation (grade 1 diastolic dysfunction). - Aortic valve: There was trivial regurgitation. - Mitral valve: Mild prolapse, involving the anterior leaflet and the posterior leaflet. - Left atrium: The atrium was mildly dilated.    EchoStudy Conclusions2/2019  - Left ventricle: The cavity size was normal. Systolic function was normal. The estimated ejection fraction was in the range of 55% to 60%. Wall motion was normal; there were no regional wall motion abnormalities. Features are consistent with a pseudonormal left ventricular filling pattern, with concomitant abnormal relaxation and increased filling pressure (grade 2 diastolic dysfunction). Doppler parameters are consistent with high ventricular filling pressure. - Mitral valve: Mild, late systolicprolapse, involving the anterior leaflet and the posterior leaflet. There was trivial regurgitation. - Left atrium: The atrium was mildly dilated. - Right atrium: The atrium was mildly dilated. - Pulmonary arteries: PA peak pressure: 48 mm Hg (S).   Impressions:  - The right ventricular systolic pressure was increased consistent with moderate pulmonary hypertension. Notes recorded by Burtis Junes, NP on 06/13/2017 at 3:15 PM EST I have called [facility] with the echo report. I have also had Dr. Acie Fredrickson review - he also feels she has mild MR and that the pulmonary HTN looks real.  She is having NO shortness of breath.  Will refer to pulmonary for further evaluation - Dr. Lamonte Sakai only.        GXT Study Highlights from 11/2015    Negative GXT  The patient walked for 6 minutes on a Bruce protocol GXT. The peak H R was 149 which is 97% predicted maximal HR . There were no ST or T wave changes to suggest ischemia.  Normal BP response  The patient walked for a total of 6 minutes on a Bruce protocol GXT. The patient achieved a peak HR of 149 which is 97% predicted maximal HR. There were no ST changes to suggest ischemia. The BP response to exercise was normal.      Echo Study Conclusions 06/2015  - Left ventricle: The cavity size was normal. Wall thickness was normal. Systolic function was normal. The estimated ejection fraction was in the range of 55% to 60%. Wall motion was normal; there were no regional wall motion abnormalities. Doppler parameters are consistent with abnormal left ventricular relaxation (grade 1 diastolic dysfunction). - Aortic valve: There was mild regurgitation. - Mitral valve: Moderate prolapse, involving the anterior leaflet and the posterior leaflet. There was mild regurgitation.    Assessment/Plan:  1. Situational stress -still the primary crux/issue. Not as bad - but still worrisome.    2. MVP with mild MR from prior echo -recent echos noted.Will be repeating later this month.   3. Palpitations -PAC on EKG noted - not too bothersome for her.  4. HLD -her labs from March are noted.  No changes made today.   5.Concern for pulmonary HTN - repeat echo  did not demonstrate this - will plan to repeat her study now.  She has no symptoms.   6. General health concerns - may need to see GYN. May need screening breast MRI. If she was to need gallbladder surgery - ok from our  standpoint.   7. COVID-19 Education: The signs and symptoms of COVID-19 were discussed with the patient and how to seek care for testing (follow up with PCP or arrange E-visit).  The importance of social distancing, staying at home, hand hygiene and wearing a mask when out in public were discussed today.  Current medicines are reviewed with the patient today.  The patient does not have concerns regarding medicines other than what has been noted above.  The following changes have been made:  See above.  Labs/ tests ordered today include:    Orders Placed This Encounter  Procedures  . EKG 12-Lead  . ECHOCARDIOGRAM COMPLETE     Disposition:   FU with me in 6 months.   Patient is agreeable to this plan and will call if any problems develop in the interim.   SignedTruitt Merle, NP  11/11/2018 9:58 AM  [facility] [address] [place], [place]  [address] Phone: [phone] Fax: [phone]

## 2018-11-11 ENCOUNTER — Encounter: Payer: Self-pay | Admitting: Nurse Practitioner

## 2018-11-11 ENCOUNTER — Telehealth: Payer: Self-pay

## 2018-11-11 ENCOUNTER — Ambulatory Visit (INDEPENDENT_AMBULATORY_CARE_PROVIDER_SITE_OTHER): Payer: Medicare Other | Admitting: Nurse Practitioner

## 2018-11-11 ENCOUNTER — Other Ambulatory Visit: Payer: Self-pay

## 2018-11-11 VITALS — BP 130/78 | HR 58 | Ht 63.5 in | Wt 126.4 lb

## 2018-11-11 DIAGNOSIS — I272 Pulmonary hypertension, unspecified: Secondary | ICD-10-CM

## 2018-11-11 DIAGNOSIS — R002 Palpitations: Secondary | ICD-10-CM

## 2018-11-11 DIAGNOSIS — E7800 Pure hypercholesterolemia, unspecified: Secondary | ICD-10-CM | POA: Diagnosis not present

## 2018-11-11 DIAGNOSIS — I341 Nonrheumatic mitral (valve) prolapse: Secondary | ICD-10-CM

## 2018-11-11 NOTE — Patient Instructions (Addendum)
After Visit Summary:  We will be checking the following labs today - NONE   Medication Instructions:    Continue with your current medicines.    If you need a refill on your cardiac medications before your next appointment, please call your pharmacy.     Testing/Procedures To Be Arranged:  Echocardiogram  Follow-Up:   See me in 6 months    At [facility], you and your health needs are our priority.  As part of our continuing mission to provide you with exceptional heart care, we have created designated Provider Care Teams.  These Care Teams include your primary Cardiologist (physician) and Advanced Practice Providers (APPs -  Physician Assistants and Nurse Practitioners) who all work together to provide you with the care you need, when you need it.  Special Instructions:  . Stay safe, stay home, wash your hands for at least 20 seconds and wear a mask when out in public.  . It was good to talk with you today.    Call the [place] office at [phone] if you have any questions, problems or concerns.

## 2018-11-11 NOTE — Telephone Encounter (Signed)

## 2018-11-15 ENCOUNTER — Ambulatory Visit (HOSPITAL_COMMUNITY): Payer: Medicare Other | Attending: Internal Medicine

## 2018-11-15 ENCOUNTER — Other Ambulatory Visit: Payer: Self-pay

## 2018-11-15 DIAGNOSIS — I341 Nonrheumatic mitral (valve) prolapse: Secondary | ICD-10-CM | POA: Insufficient documentation

## 2018-11-15 DIAGNOSIS — R002 Palpitations: Secondary | ICD-10-CM

## 2018-11-15 DIAGNOSIS — E7800 Pure hypercholesterolemia, unspecified: Secondary | ICD-10-CM | POA: Diagnosis not present

## 2018-11-15 DIAGNOSIS — I272 Pulmonary hypertension, unspecified: Secondary | ICD-10-CM | POA: Diagnosis not present

## 2018-11-27 DIAGNOSIS — K824 Cholesterolosis of gallbladder: Secondary | ICD-10-CM | POA: Diagnosis not present

## 2019-01-14 DIAGNOSIS — G4700 Insomnia, unspecified: Secondary | ICD-10-CM | POA: Diagnosis not present

## 2019-01-14 DIAGNOSIS — I519 Heart disease, unspecified: Secondary | ICD-10-CM | POA: Diagnosis not present

## 2019-01-14 DIAGNOSIS — I272 Pulmonary hypertension, unspecified: Secondary | ICD-10-CM | POA: Diagnosis not present

## 2019-01-14 DIAGNOSIS — I341 Nonrheumatic mitral (valve) prolapse: Secondary | ICD-10-CM | POA: Diagnosis not present

## 2019-01-20 DIAGNOSIS — H524 Presbyopia: Secondary | ICD-10-CM | POA: Diagnosis not present

## 2019-03-03 ENCOUNTER — Other Ambulatory Visit: Payer: Self-pay | Admitting: Nurse Practitioner

## 2019-05-09 NOTE — Progress Notes (Signed)
CARDIOLOGY OFFICE NOTE  Date:  05/13/2019    Shannon Brennan Date of Birth: 1946/01/15 Medical Record [id number]  PCP:  Prince Solian, MD  Cardiologist:  Servando Snare   Chief Complaint  Patient presents with  . Follow-up    History of Present Illness: Shannon Brennan is a 74 y.o. female who presents today for a 6 month check.  Former patient of Dr. Sherryl Barters. She is following with me.   She has a history of MVP, palpitations, HLD, and a history of hepatic hemangiomas. Last echo dates back to 2017- had MVP with mild MR noted. She does not have known CAD.   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 Brother ended up dying. Lots of stress/angst with the caregiver role.   When seen in January of 2019 - shehad hada spell during the snow the month prior - BP elevated - took aspirin and extra atenolol. Stress level just incredible despite having 24 hour around the clock care. We got her echo updated - PAP elevated with pulmonary HTN noted - referred to pulmonary - repeat study however looked completely normal. She wasnevertreated.Both echos were reviewed by multiple providers as well. Mother subsequently passed away.   Last seen in 11/30/22 - had several medical issues - gallbladder polyp - more dense breast tissue - considering MRI - still on low dose HRT - dealing with her mom's estate and a son's divorce. We updated her echo again - no PAP noted.    The patient does not have symptoms concerning for COVID-19 infection (fever, chills, cough, or new shortness of breath).   Comes in today. Here alone. She feels ok. She is surprised at how well she is doing given everything going on in her life. Sold her mom's house in one day. She is back home. Her son is still there - there is lots of angst/issues with his divorce - sounds pretty ugly. BP better  at home. No chest pain. No recent labs. She is fasting today. She is going to have another GB ultrasound to follow up on the polyp. She did not have breast MRI but is off HRT.    Past Medical History:  Diagnosis Date  . Allergic rhinitis   . Arthritis    mild in right thumb  . Diverticulosis   . Family history of cardiovascular disease   . Hepatic hemangioma    history of -- followe by Dr. Cristina Gong  . Hx of osteopenia   . Hyperlipidemia    tolerates low dose Lipitor  . Hypertension    controlled with atenolol  . Insomnia   . Mitral valve prolapse    last echo in 2005 showing mild MVP with normal LV function  . Normal nuclear stress test 2006  . Palpitations   . Pyuria   . UTI (urinary tract infection)   . Vaginal atrophy     Past Surgical History:  Procedure Laterality Date  . ABDOMINAL HYSTERECTOMY    . LUMBAR LAMINECTOMY/DECOMPRESSION MICRODISCECTOMY  01/04/2012   Procedure: LUMBAR LAMINECTOMY/DECOMPRESSION MICRODISCECTOMY 1 LEVEL;  Surgeon: Kristeen Miss, MD;  Location: [place] [facility];  Service: Neurosurgery;  Laterality: Left;  Left Lumbar Five-Sacral One Microdiskectomy  . PARTIAL HYSTERECTOMY    . TONSILLECTOMY       Medications: Current Meds  Medication Sig  .  atenolol (TENORMIN) 25 MG tablet TAKE 1/2 TABLET(12.5 MG) BY MOUTH DAILY  . atorvastatin (LIPITOR) 10 MG tablet Take 0.5 tablets (5 mg total) by mouth 3 (three) times a week.  . cetirizine (ZYRTEC) 5 MG tablet Take 5 mg by mouth daily.  . Nutritional Supplements (MELATONIN PO) Take 5 mg by mouth at bedtime.   . Probiotic Product (ALIGN PO) Take by mouth.  . traZODone (DESYREL) 50 MG tablet Take 50 mg by mouth 2 (two) times daily.   . [DISCONTINUED] Estradiol (YUVAFEM) 10 MCG TABS vaginal tablet Yuvafem 10 mcg vaginal tablet  INSERT 1 T  VAGINALLY TWICE A WEEK     Allergies: Allergies  Allergen Reactions  . Erythromycin Diarrhea  . Flagyl [Metronidazole Hcl] Nausea Only  . Levofloxacin Other (See Comments)     Headache  . Wellbutrin [Bupropion Hcl] Other (See Comments)    Dizziness  . Zoloft [Sertraline Hcl]     [employer]   . Restoril Palpitations    Social History: The patient  reports that she has never smoked. She has never used smokeless tobacco. She reports that she does not drink alcohol or use drugs.   Family History: The patient's family history includes Arthritis in her father and mother; Cancer in her father and mother; Coronary artery disease in her brother; Diabetes in her brother; Heart attack in her brother; Hyperlipidemia in her mother; Hypertension in her brother, brother, and mother; Multiple sclerosis in her brother; Osteopenia in her mother; Stroke in her mother; Ulcers in her mother.   Review of Systems: Please see the history of present illness.   All other systems are reviewed and negative.   Physical Exam: VS:  BP (!) 142/70   Pulse 62   Ht 5' 3.5" (1.613 m)   Wt 128 lb (58.1 kg)   SpO2 99%   BMI 22.32 kg/m  .  BMI Body mass index is 22.32 kg/m.  Wt Readings from Last 3 Encounters:  05/13/19 128 lb (58.1 kg)  11/11/18 126 lb 6.4 oz (57.3 kg)  05/15/18 124 lb 12.8 oz (56.6 kg)   [place] [address] by me.  General: Pleasant. Well developed, well nourished and in no acute distress.   HEENT: Normal.  Neck: Supple, no JVD, carotid bruits, or masses noted.  Cardiac: Regular rate and Shannon. No murmurs, rubs, or gallops. No edema.  Respiratory:  Lungs are clear to auscultation bilaterally with normal work of breathing.  GI: Soft and nontender.  MS: No deformity or atrophy. Gait and ROM intact.  Skin: Warm and dry. Color is normal.  Neuro:  Strength and sensation are intact and no gross focal deficits noted.  Psych: Alert, appropriate and with normal affect.   LABORATORY DATA:  EKG:  EKG is not ordered today.  Lab Results  Component Value Date   WBC 6.4 05/15/2018   HGB 13.5 05/15/2018   HCT 39.2 05/15/2018   PLT 223 05/15/2018   GLUCOSE 99 05/15/2018    CHOL 180 05/15/2018   TRIG 97 05/15/2018   HDL 67 05/15/2018   LDLDIRECT 133.0 02/05/2013   LDLCALC 94 05/15/2018   ALT 15 05/15/2018   AST 17 05/15/2018   NA 142 05/15/2018   K 4.4 05/15/2018   CL 102 05/15/2018   CREATININE 0.86 05/15/2018   BUN 18 05/15/2018   CO2 24 05/15/2018   TSH 2.500 05/15/2018       BNP (last 3 results) No results for input(s): BNP in the last 8760 hours.  ProBNP (  last 3 results) No results for input(s): PROBNP in the last 8760 hours.   Other Studies Reviewed Today:  ECHO IMPRESSIONS 10/2018   1. The left ventricle has normal systolic function with an ejection fraction of 60-65%. The cavity size was normal. Left ventricular diastolic Doppler parameters are consistent with impaired relaxation. No evidence of left ventricular regional wall  motion abnormalities.  2. The right ventricle has normal systolic function. The cavity was normal. There is no increase in right ventricular wall thickness.  3. Moderate mitral valve prolapse.  4. The mitral valve is abnormal. Mild thickening of the mitral valve leaflet. The MR jet is posteriorly-directed.  5. The tricuspid valve is grossly normal.  6. The aortic valve is tricuspid. No stenosis of the aortic valve.  7. The ascending aorta and aortic root are normal in size and structure.  8. When compared to the prior study: 10/08/17 EF 55-60. Trivial MR.  EchoStudy Conclusions6/2019  - Left ventricle: The cavity size was normal. There was mild focal basal hypertrophy of the septum. Systolic function was normal. The estimated ejection fraction was in the range of 55% to 60%. Wall motion was normal; there were no regional wall motion abnormalities. Doppler parameters are consistent with abnormal left ventricular relaxation (grade 1 diastolic dysfunction). - Aortic valve: There was trivial regurgitation. - Mitral valve: Mild prolapse, involving the anterior leaflet and the posterior  leaflet. - Left atrium: The atrium was mildly dilated.    EchoStudy Conclusions2/2019  - Left ventricle: The cavity size was normal. Systolic function was normal. The estimated ejection fraction was in the range of 55% to 60%. Wall motion was normal; there were no regional wall motion abnormalities. Features are consistent with a pseudonormal left ventricular filling pattern, with concomitant abnormal relaxation and increased filling pressure (grade 2 diastolic dysfunction). Doppler parameters are consistent with high ventricular filling pressure. - Mitral valve: Mild, late systolicprolapse, involving the anterior leaflet and the posterior leaflet. There was trivial regurgitation. - Left atrium: The atrium was mildly dilated. - Right atrium: The atrium was mildly dilated. - Pulmonary arteries: PA peak pressure: 48 mm Hg (S).  Impressions:  - The right ventricular systolic pressure was increased consistent with moderate pulmonary hypertension. Notes recorded by Burtis Junes, NP on 06/13/2017 at 3:15 PM EST I have called [facility] with the echo report. I have also had Dr. Acie Fredrickson review - he also feels she has mild MR and that the pulmonary HTN looks real.  She is having NO shortness of breath.  Will refer to pulmonary for further evaluation - Dr. Lamonte Sakai only.        GXT Study Highlights from 11/2015    Negative GXT  The patient walked for 6 minutes on a Bruce protocol GXT. The peak H R was 149 which is 97% predicted maximal HR . There were no ST or T wave changes to suggest ischemia.  Normal BP response  The patient walked for a total of 6 minutes on a Bruce protocol GXT. The patient achieved a peak HR of 149 which is 97% predicted maximal HR. There were no ST changes to suggest ischemia. The BP response to exercise was normal.       Assessment/Plan:  1. Situational stress - has now shifted to the divorce of her son and  the kids - very sad situation. We talked at length. She is getting counseling.   2. MVP - mild MR - last echo from July.   3. Palpitations - not  too bothersome - has had prior PACs noted on past EKG.   4. HLD - recheck lab today.   5. Prior concern for pulmonary HTN - not demonstrated on repeat studies.   6. COVID-19 Education: The signs and symptoms of COVID-19 were discussed with the patient and how to seek care for testing (follow up with PCP or arrange E-visit).  The importance of social distancing, staying at home, hand hygiene and wearing a mask when out in public were discussed today.  Current medicines are reviewed with the patient today.  The patient does not have concerns regarding medicines other than what has been noted above.  The following changes have been made:  See above.  Labs/ tests ordered today include:    Orders Placed This Encounter  Procedures  . Basic metabolic panel  . CBC no Diff  . Hepatic function panel  . Lipid Profile  . TSH     Disposition:   FU with me in 6 months.   Patient is agreeable to this plan and will call if any problems develop in the interim.   SignedTruitt Merle, NP  05/13/2019 9:40 AM  [facility] [address] [place], [place]  [address] Phone: [phone] Fax: [phone]

## 2019-05-12 ENCOUNTER — Other Ambulatory Visit: Payer: Self-pay | Admitting: Surgery

## 2019-05-12 DIAGNOSIS — K824 Cholesterolosis of gallbladder: Secondary | ICD-10-CM

## 2019-05-13 ENCOUNTER — Encounter: Payer: Self-pay | Admitting: Nurse Practitioner

## 2019-05-13 ENCOUNTER — Other Ambulatory Visit: Payer: Self-pay

## 2019-05-13 ENCOUNTER — Ambulatory Visit (INDEPENDENT_AMBULATORY_CARE_PROVIDER_SITE_OTHER): Payer: Medicare Other | Admitting: Nurse Practitioner

## 2019-05-13 VITALS — BP 142/70 | HR 62 | Ht 63.5 in | Wt 128.0 lb

## 2019-05-13 DIAGNOSIS — R002 Palpitations: Secondary | ICD-10-CM

## 2019-05-13 DIAGNOSIS — I341 Nonrheumatic mitral (valve) prolapse: Secondary | ICD-10-CM

## 2019-05-13 DIAGNOSIS — E7800 Pure hypercholesterolemia, unspecified: Secondary | ICD-10-CM

## 2019-05-13 LAB — LIPID PANEL
Chol/HDL Ratio: 2.6 ratio (ref 0.0–4.4)
Cholesterol, Total: 187 mg/dL (ref 100–199)
HDL: 72 mg/dL (ref >39)
LDL Chol Calc (NIH): 98 mg/dL (ref 0–99)
Triglycerides: 94 mg/dL (ref 0–149)
VLDL Cholesterol Cal: 17 mg/dL (ref 5–40)

## 2019-05-13 LAB — BASIC METABOLIC PANEL
BUN/Creatinine Ratio: 18 (ref 12–28)
BUN: 15 mg/dL (ref 8–27)
CO2: 26 mmol/L (ref 20–29)
Calcium: 9.7 mg/dL (ref 8.7–10.3)
Chloride: 100 mmol/L (ref 96–106)
Creatinine, Ser: 0.85 mg/dL (ref 0.57–1.00)
GFR calc Af Amer: 78 mL/min/{1.73_m2} (ref >59)
GFR calc non Af Amer: 68 mL/min/{1.73_m2} (ref >59)
Glucose: 100 mg/dL — ABNORMAL HIGH (ref 65–99)
Potassium: 4.5 mmol/L (ref 3.5–5.2)
Sodium: 142 mmol/L (ref 134–144)

## 2019-05-13 LAB — CBC
Hematocrit: 42.7 % (ref 34.0–46.6)
Hemoglobin: 14.6 g/dL (ref 11.1–15.9)
MCH: 29.7 pg (ref 26.6–33.0)
MCHC: 34.2 g/dL (ref 31.5–35.7)
MCV: 87 fL (ref 79–97)
Platelets: 230 10*3/uL (ref 150–450)
RBC: 4.91 x10E6/uL (ref 3.77–5.28)
RDW: 13.1 % (ref 11.7–15.4)
WBC: 6.7 10*3/uL (ref 3.4–10.8)

## 2019-05-13 LAB — HEPATIC FUNCTION PANEL
ALT: 17 IU/L (ref 0–32)
AST: 17 IU/L (ref 0–40)
Albumin: 4.8 g/dL — ABNORMAL HIGH (ref 3.7–4.7)
Alkaline Phosphatase: 69 IU/L (ref 39–117)
Bilirubin Total: 0.7 mg/dL (ref 0.0–1.2)
Bilirubin, Direct: 0.21 mg/dL (ref 0.00–0.40)
Total Protein: 6.8 g/dL (ref 6.0–8.5)

## 2019-05-13 LAB — TSH: TSH: 3 u[IU]/mL (ref 0.450–4.500)

## 2019-05-13 NOTE — Patient Instructions (Addendum)
After Visit Summary:  We will be checking the following labs today - BMET, CBC, HPF, Lipids and TSH   Medication Instructions:    Continue with your current medicines.    If you need a refill on your cardiac medications before your next appointment, please call your pharmacy.     Testing/Procedures To Be Arranged:  N/A  Follow-Up:   See me in 6 months.     At [facility], you and your health needs are our priority.  As part of our continuing mission to provide you with exceptional heart care, we have created designated Provider Care Teams.  These Care Teams include your primary Cardiologist (physician) and Advanced Practice Providers (APPs -  Physician Assistants and Nurse Practitioners) who all work together to provide you with the care you need, when you need it.  Special Instructions:  . Stay safe, stay home, wash your hands for at least 20 seconds and wear a mask when out in public.  . It was good to talk with you today.     Call the [place] office at [phone] if you have any questions, problems or concerns.

## 2019-05-20 ENCOUNTER — Ambulatory Visit
Admission: RE | Admit: 2019-05-20 | Discharge: 2019-05-20 | Disposition: A | Discharge status: Home / Self Care | Payer: Medicare Other | Source: Ambulatory Visit | Attending: Surgery | Admitting: Surgery

## 2019-05-20 DIAGNOSIS — K7689 Other specified diseases of liver: Secondary | ICD-10-CM | POA: Diagnosis not present

## 2019-05-20 DIAGNOSIS — K824 Cholesterolosis of gallbladder: Secondary | ICD-10-CM

## 2019-05-23 DIAGNOSIS — R21 Rash and other nonspecific skin eruption: Secondary | ICD-10-CM | POA: Diagnosis not present

## 2019-05-23 DIAGNOSIS — Z20822 Contact with and (suspected) exposure to covid-19: Secondary | ICD-10-CM | POA: Diagnosis not present

## 2019-05-29 DIAGNOSIS — Z85828 Personal history of other malignant neoplasm of skin: Secondary | ICD-10-CM | POA: Diagnosis not present

## 2019-05-29 DIAGNOSIS — D2271 Melanocytic nevi of right lower limb, including hip: Secondary | ICD-10-CM | POA: Diagnosis not present

## 2019-05-29 DIAGNOSIS — Z85820 Personal history of malignant melanoma of skin: Secondary | ICD-10-CM | POA: Diagnosis not present

## 2019-05-29 DIAGNOSIS — L570 Actinic keratosis: Secondary | ICD-10-CM | POA: Diagnosis not present

## 2019-06-11 DIAGNOSIS — L853 Xerosis cutis: Secondary | ICD-10-CM | POA: Diagnosis not present

## 2019-06-11 DIAGNOSIS — Z124 Encounter for screening for malignant neoplasm of cervix: Secondary | ICD-10-CM | POA: Diagnosis not present

## 2019-06-20 ENCOUNTER — Other Ambulatory Visit: Payer: Self-pay | Admitting: Surgery

## 2019-06-20 DIAGNOSIS — K824 Cholesterolosis of gallbladder: Secondary | ICD-10-CM | POA: Diagnosis not present

## 2019-07-16 DIAGNOSIS — Z0120 Encounter for dental examination and cleaning without abnormal findings: Secondary | ICD-10-CM | POA: Diagnosis not present

## 2019-08-11 DIAGNOSIS — M810 Age-related osteoporosis without current pathological fracture: Secondary | ICD-10-CM | POA: Diagnosis not present

## 2019-08-11 DIAGNOSIS — E7849 Other hyperlipidemia: Secondary | ICD-10-CM | POA: Diagnosis not present

## 2019-08-11 DIAGNOSIS — Z0000 Encounter for general adult medical examination without abnormal findings: Secondary | ICD-10-CM | POA: Diagnosis not present

## 2019-08-18 DIAGNOSIS — R82998 Other abnormal findings in urine: Secondary | ICD-10-CM | POA: Diagnosis not present

## 2019-08-18 DIAGNOSIS — I341 Nonrheumatic mitral (valve) prolapse: Secondary | ICD-10-CM | POA: Diagnosis not present

## 2019-08-18 DIAGNOSIS — I872 Venous insufficiency (chronic) (peripheral): Secondary | ICD-10-CM | POA: Diagnosis not present

## 2019-08-18 DIAGNOSIS — Z0000 Encounter for general adult medical examination without abnormal findings: Secondary | ICD-10-CM | POA: Diagnosis not present

## 2019-08-18 DIAGNOSIS — E785 Hyperlipidemia, unspecified: Secondary | ICD-10-CM | POA: Diagnosis not present

## 2019-08-20 DIAGNOSIS — Z1212 Encounter for screening for malignant neoplasm of rectum: Secondary | ICD-10-CM | POA: Diagnosis not present

## 2019-08-20 LAB — IFOBT (OCCULT BLOOD): IFOBT: NEGATIVE

## 2019-09-05 DIAGNOSIS — M810 Age-related osteoporosis without current pathological fracture: Secondary | ICD-10-CM | POA: Diagnosis not present

## 2019-09-08 DIAGNOSIS — H5213 Myopia, bilateral: Secondary | ICD-10-CM | POA: Diagnosis not present

## 2019-09-08 DIAGNOSIS — H25813 Combined forms of age-related cataract, bilateral: Secondary | ICD-10-CM | POA: Diagnosis not present

## 2019-09-08 DIAGNOSIS — H52222 Regular astigmatism, left eye: Secondary | ICD-10-CM | POA: Diagnosis not present

## 2019-09-08 DIAGNOSIS — H11009 Unspecified pterygium of unspecified eye: Secondary | ICD-10-CM | POA: Diagnosis not present

## 2019-10-13 ENCOUNTER — Other Ambulatory Visit: Payer: Self-pay | Admitting: Surgery

## 2019-10-13 DIAGNOSIS — K824 Cholesterolosis of gallbladder: Secondary | ICD-10-CM

## 2019-10-14 DIAGNOSIS — H25013 Cortical age-related cataract, bilateral: Secondary | ICD-10-CM | POA: Diagnosis not present

## 2019-10-14 DIAGNOSIS — H18413 Arcus senilis, bilateral: Secondary | ICD-10-CM | POA: Diagnosis not present

## 2019-10-14 DIAGNOSIS — H25043 Posterior subcapsular polar age-related cataract, bilateral: Secondary | ICD-10-CM | POA: Diagnosis not present

## 2019-10-14 DIAGNOSIS — H2513 Age-related nuclear cataract, bilateral: Secondary | ICD-10-CM | POA: Diagnosis not present

## 2019-10-20 DIAGNOSIS — E7849 Other hyperlipidemia: Secondary | ICD-10-CM | POA: Diagnosis not present

## 2019-10-21 ENCOUNTER — Ambulatory Visit
Admission: RE | Admit: 2019-10-21 | Discharge: 2019-10-21 | Disposition: A | Discharge status: Home / Self Care | Payer: Medicare Other | Source: Ambulatory Visit | Attending: Surgery | Admitting: Surgery

## 2019-10-21 DIAGNOSIS — K824 Cholesterolosis of gallbladder: Secondary | ICD-10-CM

## 2019-10-27 DIAGNOSIS — Z961 Presence of intraocular lens: Secondary | ICD-10-CM | POA: Diagnosis not present

## 2019-10-27 DIAGNOSIS — H52222 Regular astigmatism, left eye: Secondary | ICD-10-CM | POA: Diagnosis not present

## 2019-10-27 DIAGNOSIS — H5212 Myopia, left eye: Secondary | ICD-10-CM | POA: Diagnosis not present

## 2019-10-27 DIAGNOSIS — H2512 Age-related nuclear cataract, left eye: Secondary | ICD-10-CM | POA: Diagnosis not present

## 2019-10-28 DIAGNOSIS — H2511 Age-related nuclear cataract, right eye: Secondary | ICD-10-CM | POA: Diagnosis not present

## 2019-10-28 NOTE — Progress Notes (Signed)
CARDIOLOGY OFFICE NOTE  Date:  11/11/2019    Shannon Brennan Date of Birth: 02-03-1946 Medical Record #[id number]  PCP:  Prince Solian, MD  Cardiologist:  Servando Snare   Chief Complaint  Patient presents with  . Follow-up    History of Present Illness: Shannon Brennan is a 74 y.o. female who presents today for a 6 month check. Former patient of Dr. Sherryl Barters. She is following with me.   She has a history of MVP, palpitations, HLD, and a history of hepatic hemangiomas. Last echo dates back to 2017- had MVP with mild MR noted. She does not have known CAD.  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 Brother ended up dying. Lots of stress/angst with the caregiver role.  When seen in January of 2019 - shehad hada spell during the snow the month prior - BP elevated - took aspirin and extra atenolol. Stress level just incredible despite having 24 hour around the clock care. We got her echo updated - PAP elevated with pulmonary HTN noted - referred to pulmonary - repeat study however looked completely normal. She wasnevertreated.Both echos were reviewed by multiple providers as well. Mother subsequently passed away.   When seen in 2018-12-11 - had several medical issues - gallbladder polyp - more dense breast tissue - considering MRI - still on low dose HRT - dealing with her mom's estate and a son's divorce. We updated her echo again - no PAP noted.  Last seen in January and she was doing well. She was going to have another GB ultrasound to follow up on the polyp. She did not have breast MRI but is off HRT.    Comes in today. Here alone. Lots of stress with her family. Son in a new relationship that is not good. Lots of issues with the grand kids. Kids not speaking to one another. Has just started back walking as of 2 weeks ago. BP tends to  run low. Anxiety driving lots of issues - not sleeping well, some IBS. Wanting to go to comfort food. Has had recent GB ultrasounds - her hemangiomas may be bigger - waiting to hear from Dr. Lucia Gaskins - sees him later this month. No chest pain. Breathing is ok. Some palpitations - at night - but not too bothersome for her. Overall, she feels like she is ok from our standpoint.   Past Medical History:  Diagnosis Date  . Allergic rhinitis   . Arthritis    mild in right thumb  . Diverticulosis   . Family history of cardiovascular disease   . Hepatic hemangioma    history of -- followe by Dr. Cristina Gong  . Hx of osteopenia   . Hyperlipidemia    tolerates low dose Lipitor  . Hypertension    controlled with atenolol  . Insomnia   . Mitral valve prolapse    last echo in 2005 showing mild MVP with normal LV function  . Normal nuclear stress test 2006  . Palpitations   . Pyuria   . UTI (urinary tract infection)   . Vaginal atrophy     Past Surgical History:  Procedure Laterality Date  . ABDOMINAL HYSTERECTOMY    . LUMBAR LAMINECTOMY/DECOMPRESSION MICRODISCECTOMY  01/04/2012   Procedure: LUMBAR LAMINECTOMY/DECOMPRESSION MICRODISCECTOMY 1 LEVEL;  Surgeon: Kristeen Miss, MD;  Location:  [place] [facility];  Service: Neurosurgery;  Laterality: Left;  Left Lumbar Five-Sacral One Microdiskectomy  . PARTIAL HYSTERECTOMY    . TONSILLECTOMY       Medications: Current Meds  Medication Sig  . atenolol (TENORMIN) 25 MG tablet TAKE 1/2 TABLET(12.5 MG) BY MOUTH DAILY  . atorvastatin (LIPITOR) 10 MG tablet Take 0.5 tablets (5 mg total) by mouth 3 (three) times a week.  . cetirizine (ZYRTEC) 5 MG tablet Take 5 mg by mouth daily.  . Difluprednate (DUREZOL) 0.05 % EMUL Apply 0.05 drops to eye daily.  . Nutritional Supplements (MELATONIN PO) Take 10 mg by mouth at bedtime.   . Probiotic Product (ALIGN PO) Take by mouth.  . traZODone (DESYREL) 50 MG tablet Take 50 mg by mouth 2 (two) times daily.       Allergies: Allergies  Allergen Reactions  . Erythromycin Diarrhea  . Flagyl [Metronidazole Hcl] Nausea Only  . Levofloxacin Other (See Comments)    Headache  . Wellbutrin [Bupropion Hcl] Other (See Comments)    Dizziness  . Zoloft [Sertraline Hcl]     [employer]   . Restoril Palpitations    Social History: The patient  reports that she has never smoked. She has never used smokeless tobacco. She reports that she does not drink alcohol and does not use drugs.   Family History: The patient's family history includes Arthritis in her father and mother; Cancer in her father and mother; Coronary artery disease in her brother; Diabetes in her brother; Heart attack in her brother; Hyperlipidemia in her mother; Hypertension in her brother, brother, and mother; Multiple sclerosis in her brother; Osteopenia in her mother; Stroke in her mother; Ulcers in her mother.   Review of Systems: Please see the history of present illness.   All other systems are reviewed and negative.   Physical Exam: VS:  BP 110/78   Pulse 61   Ht 5\' 3"  (1.6 m)   Wt 129 lb (58.5 kg)   SpO2 98%   BMI 22.85 kg/m  .  BMI Body mass index is 22.85 kg/m.  Wt Readings from Last 3 Encounters:  11/11/19 129 lb (58.5 kg)  05/13/19 128 lb (58.1 kg)  11/11/18 126 lb 6.4 oz (57.3 kg)    General: Pleasant. Well developed, well nourished and in no acute distress.   HEENT: Normal.  Neck: Supple, no JVD, carotid bruits, or masses noted.  Cardiac: Regular rate and rhythm. No murmurs, rubs, or gallops. No edema.  Respiratory:  Lungs are clear to auscultation bilaterally with normal work of breathing.  GI: Soft and nontender.  MS: No deformity or atrophy. Gait and ROM intact.  Skin: Warm and dry. Color is normal.  Neuro:  Strength and sensation are intact and no gross focal deficits noted.  Psych: Alert, appropriate and with normal affect.   LABORATORY DATA:  EKG:  EKG is ordered today.  Personally reviewed by me.  This demonstrates sinus rhythm - HR is 61 - 1st degree AV block.  Lab Results  Component Value Date   WBC 6.7 05/13/2019   HGB 14.6 05/13/2019   HCT 42.7 05/13/2019   PLT 230 05/13/2019   GLUCOSE 100 (H) 05/13/2019   CHOL 187 05/13/2019   TRIG 94 05/13/2019   HDL 72 05/13/2019   LDLDIRECT 133.0 02/05/2013   LDLCALC 98 05/13/2019   ALT 17 05/13/2019   AST 17 05/13/2019   NA 142 05/13/2019   K 4.5 05/13/2019   CL 100 05/13/2019   CREATININE  0.85 05/13/2019   BUN 15 05/13/2019   CO2 26 05/13/2019   TSH 3.000 05/13/2019       BNP (last 3 results) No results for input(s): BNP in the last 8760 hours.  ProBNP (last 3 results) No results for input(s): PROBNP in the last 8760 hours.   Other Studies Reviewed Today:  ECHO IMPRESSIONS 10/2018  1. The left ventricle has normal systolic function with an ejection fraction of 60-65%. The cavity size was normal. Left ventricular diastolic Doppler parameters are consistent with impaired relaxation. No evidence of left ventricular regional wall  motion abnormalities. 2. The right ventricle has normal systolic function. The cavity was normal. There is no increase in right ventricular wall thickness. 3. Moderate mitral valve prolapse. 4. The mitral valve is abnormal. Mild thickening of the mitral valve leaflet. The MR jet is posteriorly-directed. 5. The tricuspid valve is grossly normal. 6. The aortic valve is tricuspid. No stenosis of the aortic valve. 7. The ascending aorta and aortic root are normal in size and structure. 8. When compared to the prior study: 10/08/17 EF 55-60. Trivial MR.  EchoStudy Conclusions6/2019  - Left ventricle: The cavity size was normal. There was mild focal basal hypertrophy of the septum. Systolic function was normal. The estimated ejection fraction was in the range of 55% to 60%. Wall motion was normal; there were no regional wall motion abnormalities. Doppler parameters are  consistent with abnormal left ventricular relaxation (grade 1 diastolic dysfunction). - Aortic valve: There was trivial regurgitation. - Mitral valve: Mild prolapse, involving the anterior leaflet and the posterior leaflet. - Left atrium: The atrium was mildly dilated.    EchoStudy Conclusions2/2019  - Left ventricle: The cavity size was normal. Systolic function was normal. The estimated ejection fraction was in the range of 55% to 60%. Wall motion was normal; there were no regional wall motion abnormalities. Features are consistent with a pseudonormal left ventricular filling pattern, with concomitant abnormal relaxation and increased filling pressure (grade 2 diastolic dysfunction). Doppler parameters are consistent with high ventricular filling pressure. - Mitral valve: Mild, late systolicprolapse, involving the anterior leaflet and the posterior leaflet. There was trivial regurgitation. - Left atrium: The atrium was mildly dilated. - Right atrium: The atrium was mildly dilated. - Pulmonary arteries: PA peak pressure: 48 mm Hg (S).  Impressions:  - The right ventricular systolic pressure was increased consistent with moderate pulmonary hypertension. Notes recorded by Burtis Junes, NP on 06/13/2017 at 3:15 PM EST I have called [facility] with the echo report. I have also had Dr. Acie Fredrickson review - he also feels she has mild MR and that the pulmonary HTN looks real.  She is having NO shortness of breath.  Will refer to pulmonary for further evaluation - Dr. Lamonte Sakai only.        GXT Study Highlights from 11/2015    Negative GXT  The patient walked for 6 minutes on a Bruce protocol GXT. The peak H R was 149 which is 97% predicted maximal HR . There were no ST or T wave changes to suggest ischemia.  Normal BP response  The patient walked for a total of 6 minutes on a Bruce protocol GXT. The patient achieved a peak HR of 149 which is 97%  predicted maximal HR. There were no ST changes to suggest ischemia. The BP response to exercise was normal.       Assessment/Plan:  1. Situational stress - long discussion. She remains in her own counseling. Needs to really  make herself a priority.   2. MVP - mild - last echo noted.   3. Palpitations - not too bothersome.   4. Prior concern for pulmonary HTN - not demonstrated on repeat studies.   Current medicines are reviewed with the patient today.  The patient does not have concerns regarding medicines other than what has been noted above.  The following changes have been made:  See above.  Labs/ tests ordered today include:    Orders Placed This Encounter  Procedures  . EKG 12-Lead     Disposition:   FU with me in 6 months.   Patient is agreeable to this plan and will call if any problems develop in the interim.   SignedTruitt Merle, NP  11/11/2019 10:08 AM  [facility] [address] [place], [place]  [address] Phone: [phone] Fax: [phone]

## 2019-11-07 DIAGNOSIS — Z1231 Encounter for screening mammogram for malignant neoplasm of breast: Secondary | ICD-10-CM | POA: Diagnosis not present

## 2019-11-11 ENCOUNTER — Ambulatory Visit: Payer: Medicare Other | Admitting: Nurse Practitioner

## 2019-11-11 ENCOUNTER — Encounter: Payer: Self-pay | Admitting: Nurse Practitioner

## 2019-11-11 ENCOUNTER — Other Ambulatory Visit: Payer: Self-pay

## 2019-11-11 VITALS — BP 110/78 | HR 61 | Ht 63.0 in | Wt 129.0 lb

## 2019-11-11 DIAGNOSIS — R002 Palpitations: Secondary | ICD-10-CM | POA: Diagnosis not present

## 2019-11-11 DIAGNOSIS — I341 Nonrheumatic mitral (valve) prolapse: Secondary | ICD-10-CM

## 2019-11-11 NOTE — Patient Instructions (Addendum)
After Visit Summary:  We will be checking the following labs today - NONE   Medication Instructions:    Continue with your current medicines.    If you need a refill on your cardiac medications before your next appointment, please call your pharmacy.     Testing/Procedures To Be Arranged:  N/A  Follow-Up:   See me in 6 months    At [facility], you and your health needs are our priority.  As part of our continuing mission to provide you with exceptional heart care, we have created designated Provider Care Teams.  These Care Teams include your primary Cardiologist (physician) and Advanced Practice Providers (APPs -  Physician Assistants and Nurse Practitioners) who all work together to provide you with the care you need, when you need it.  Special Instructions:  . Stay safe, wash your hands for at least 20 seconds and wear a mask when needed.  . It was good to talk with you today.    Call the [facility] office at [phone] if you have any questions, problems or concerns.       

## 2019-11-17 DIAGNOSIS — H2511 Age-related nuclear cataract, right eye: Secondary | ICD-10-CM | POA: Diagnosis not present

## 2019-11-17 DIAGNOSIS — H52223 Regular astigmatism, bilateral: Secondary | ICD-10-CM | POA: Diagnosis not present

## 2019-11-17 DIAGNOSIS — Z961 Presence of intraocular lens: Secondary | ICD-10-CM | POA: Diagnosis not present

## 2019-11-17 DIAGNOSIS — H524 Presbyopia: Secondary | ICD-10-CM | POA: Diagnosis not present

## 2019-11-20 DIAGNOSIS — Z85828 Personal history of other malignant neoplasm of skin: Secondary | ICD-10-CM | POA: Diagnosis not present

## 2019-11-20 DIAGNOSIS — D2262 Melanocytic nevi of left upper limb, including shoulder: Secondary | ICD-10-CM | POA: Diagnosis not present

## 2019-11-20 DIAGNOSIS — Z85820 Personal history of malignant melanoma of skin: Secondary | ICD-10-CM | POA: Diagnosis not present

## 2019-11-20 DIAGNOSIS — L820 Inflamed seborrheic keratosis: Secondary | ICD-10-CM | POA: Diagnosis not present

## 2019-11-26 DIAGNOSIS — Z20822 Contact with and (suspected) exposure to covid-19: Secondary | ICD-10-CM | POA: Diagnosis not present

## 2019-12-11 DIAGNOSIS — F4323 Adjustment disorder with mixed anxiety and depressed mood: Secondary | ICD-10-CM | POA: Diagnosis not present

## 2019-12-17 DIAGNOSIS — D1803 Hemangioma of intra-abdominal structures: Secondary | ICD-10-CM | POA: Diagnosis not present

## 2019-12-17 DIAGNOSIS — K824 Cholesterolosis of gallbladder: Secondary | ICD-10-CM | POA: Diagnosis not present

## 2019-12-25 DIAGNOSIS — F4323 Adjustment disorder with mixed anxiety and depressed mood: Secondary | ICD-10-CM | POA: Diagnosis not present

## 2020-01-08 DIAGNOSIS — F4323 Adjustment disorder with mixed anxiety and depressed mood: Secondary | ICD-10-CM | POA: Diagnosis not present

## 2020-01-19 DIAGNOSIS — Z0120 Encounter for dental examination and cleaning without abnormal findings: Secondary | ICD-10-CM | POA: Diagnosis not present

## 2020-01-22 DIAGNOSIS — F4323 Adjustment disorder with mixed anxiety and depressed mood: Secondary | ICD-10-CM | POA: Diagnosis not present

## 2020-01-29 ENCOUNTER — Other Ambulatory Visit: Payer: Self-pay | Admitting: Nurse Practitioner

## 2020-02-16 DIAGNOSIS — E785 Hyperlipidemia, unspecified: Secondary | ICD-10-CM | POA: Diagnosis not present

## 2020-02-16 DIAGNOSIS — I341 Nonrheumatic mitral (valve) prolapse: Secondary | ICD-10-CM | POA: Diagnosis not present

## 2020-02-16 DIAGNOSIS — I272 Pulmonary hypertension, unspecified: Secondary | ICD-10-CM | POA: Diagnosis not present

## 2020-02-16 DIAGNOSIS — I872 Venous insufficiency (chronic) (peripheral): Secondary | ICD-10-CM | POA: Diagnosis not present

## 2020-02-17 DIAGNOSIS — L820 Inflamed seborrheic keratosis: Secondary | ICD-10-CM | POA: Diagnosis not present

## 2020-02-17 DIAGNOSIS — L218 Other seborrheic dermatitis: Secondary | ICD-10-CM | POA: Diagnosis not present

## 2020-02-17 DIAGNOSIS — Z85828 Personal history of other malignant neoplasm of skin: Secondary | ICD-10-CM | POA: Diagnosis not present

## 2020-02-19 DIAGNOSIS — F4323 Adjustment disorder with mixed anxiety and depressed mood: Secondary | ICD-10-CM | POA: Diagnosis not present

## 2020-03-04 DIAGNOSIS — F4323 Adjustment disorder with mixed anxiety and depressed mood: Secondary | ICD-10-CM | POA: Diagnosis not present

## 2020-03-18 DIAGNOSIS — F4323 Adjustment disorder with mixed anxiety and depressed mood: Secondary | ICD-10-CM | POA: Diagnosis not present

## 2020-04-01 DIAGNOSIS — F4323 Adjustment disorder with mixed anxiety and depressed mood: Secondary | ICD-10-CM | POA: Diagnosis not present

## 2020-04-15 DIAGNOSIS — F4323 Adjustment disorder with mixed anxiety and depressed mood: Secondary | ICD-10-CM | POA: Diagnosis not present

## 2020-05-05 NOTE — Progress Notes (Signed)
CARDIOLOGY OFFICE NOTE  Date:  05/11/2020    Shannon Brennan Date of Birth: 17-Aug-1945 Medical Record #[id number]  PCP:  Shannon Greathouse, MD  Cardiologist:  Shannon Brennan     Chief Complaint  Patient presents with  . Follow-up    History of Present Illness: Shannon Brennan is a 75 y.o. female who presents today for a follow up visit. Former patient of Shannon Brennan. She is following with me.    She has a history of MVP, palpitations, HLD, and a history of hepatic hemangiomas. Last echo dates back to 2017 - had MVP with mild MR noted. She does not have known CAD.    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 Brother ended up dying. Lots of stress/angst with the caregiver role.    When seen in January of 2019 - she had had a spell during the snow the month prior - BP elevated - took aspirin and extra atenolol. Stress level just incredible despite having 24 hour around the clock care. We got her echo updated - PAP elevated with pulmonary HTN noted - referred to pulmonary - repeat study however looked completely normal. She was never treated. Both echos were reviewed by multiple providers as well.  Mother subsequently passed away.    When seen in 2018/11/28 - had several medical issues - gallbladder polyp - more dense breast tissue - considering MRI - still on low dose HRT - dealing with her mom's estate and a son's divorce. We updated her echo again - no PAP noted.  When seen in January and she was doing well. She was going to have another GB ultrasound to follow up on the polyp. She did not have breast MRI but is off HRT.  Last visit was back in November 28, 2022 - still with lots of stress in her family. Son was in a new relationship that was not good. Her kids weren't speaking to each other. Lots of angst.    Comes in today. Here alone. She is doing ok from our  standpoint. No chest pain. Breathing is good. No real problem with palpitations. On very little medicine. Seems more at peace with the issues in her family. Her kids were back speaking with each other at Christmas - that was a blessing for her. No chest pain. Breathing is fine. She admits she needs motivation to keep up with exercise. Seeing Shannon Brennan in a few months with labs. She feels like she is ok from our standpoint.   Past Medical History:  Diagnosis Date  . Allergic rhinitis   . Arthritis    mild in right thumb  . Diverticulosis   . Family history of cardiovascular disease   . Hepatic hemangioma    history of -- followe by Shannon Brennan  . Hx of osteopenia   . Hyperlipidemia    tolerates low dose Lipitor  . Hypertension    controlled with atenolol  . Insomnia   . Mitral valve prolapse    last echo in 2005 showing mild MVP with normal LV function  . Normal nuclear stress test 2006  . Palpitations   . Pyuria   . UTI (urinary tract infection)   . Vaginal atrophy     Past Surgical History:  Procedure Laterality Date  . ABDOMINAL HYSTERECTOMY    .  LUMBAR LAMINECTOMY/DECOMPRESSION MICRODISCECTOMY  01/04/2012   Procedure: LUMBAR LAMINECTOMY/DECOMPRESSION MICRODISCECTOMY 1 LEVEL;  Surgeon: Shannon Miss, MD;  Location: [facility];  Service: Neurosurgery;  Laterality: Left;  Left Lumbar Five-Sacral One Microdiskectomy  . PARTIAL HYSTERECTOMY    . TONSILLECTOMY       Medications: Current Meds  Medication Sig  . atenolol (TENORMIN) 25 MG tablet TAKE 1/2 TABLET(12.5 MG) BY MOUTH DAILY  . atorvastatin (LIPITOR) 10 MG tablet Take 0.5 tablets (5 mg total) by mouth 3 (three) times a week.  . cetirizine (ZYRTEC) 5 MG tablet Take 5 mg by mouth daily.  . furosemide (LASIX) 20 MG tablet Take 20 mg by mouth daily as needed.  . Nutritional Supplements (MELATONIN PO) Take 10 mg by mouth at bedtime.  . Probiotic Product (ALIGN PO) Take by mouth in the morning and at bedtime. 2 tabs  .  traZODone (DESYREL) 50 MG tablet Take 100 mg by mouth daily.     Allergies: Allergies  Allergen Reactions  . Erythromycin Diarrhea  . Flagyl [Metronidazole Hcl] Nausea Only  . Levofloxacin Other (See Comments)    Headache  . Wellbutrin [Bupropion Hcl] Other (See Comments)    Dizziness  . Zoloft [Sertraline Hcl]     [employer]   . Restoril Palpitations    Social History: The patient  reports that she has never smoked. She has never used smokeless tobacco. She reports that she does not drink alcohol and does not use drugs.   Family History: The patient's family history includes Arthritis in her father and mother; Cancer in her father and mother; Coronary artery disease in her brother; Diabetes in her brother; Heart attack in her brother; Hyperlipidemia in her mother; Hypertension in her brother, brother, and mother; Multiple sclerosis in her brother; Osteopenia in her mother; Stroke in her mother; Ulcers in her mother.   Review of Systems: Please see the history of present illness.   All other systems are reviewed and negative.   Physical Exam: VS:  BP 120/60   Pulse 66   Ht 5' 3.5" (1.613 m)   Wt 129 lb (58.5 kg)   SpO2 98%   BMI 22.49 kg/m  .  BMI Body mass index is 22.49 kg/m.  Wt Readings from Last 3 Encounters:  05/11/20 129 lb (58.5 kg)  11/11/19 129 lb (58.5 kg)  05/13/19 128 lb (58.1 kg)    General: Pleasant. Alert and in no acute distress.   Neck: Supple, no JVD, carotid bruits, or masses noted.  Cardiac: Regular rate and rhythm. No murmurs, rubs, or gallops. No edema.  Respiratory:  Lungs are clear to auscultation bilaterally with normal work of breathing.  GI: Soft and nontender.  MS: No deformity or atrophy. Gait and ROM intact.  Skin: Warm and dry. Color is normal.  Neuro:  Strength and sensation are intact and no gross focal deficits noted.  Psych: Alert, appropriate and with normal affect.   LABORATORY DATA:  EKG:  EKG is not ordered today.    Lab  Results  Component Value Date   WBC 6.7 05/13/2019   HGB 14.6 05/13/2019   HCT 42.7 05/13/2019   PLT 230 05/13/2019   GLUCOSE 100 (H) 05/13/2019   CHOL 187 05/13/2019   TRIG 94 05/13/2019   HDL 72 05/13/2019   LDLDIRECT 133.0 02/05/2013   LDLCALC 98 05/13/2019   ALT 17 05/13/2019   AST 17 05/13/2019   NA 142 05/13/2019   K 4.5 05/13/2019   CL 100 05/13/2019  CREATININE 0.85 05/13/2019   BUN 15 05/13/2019   CO2 26 05/13/2019   TSH 3.000 05/13/2019       BNP (last 3 results) No results for input(s): BNP in the last 8760 hours.  ProBNP (last 3 results) No results for input(s): PROBNP in the last 8760 hours.   Other Studies Reviewed Today:  ECHO IMPRESSIONS 10/2018     1. The left ventricle has normal systolic function with an ejection fraction of 60-65%. The cavity size was normal. Left ventricular diastolic Doppler parameters are consistent with impaired relaxation. No evidence of left ventricular regional wall  motion abnormalities.  2. The right ventricle has normal systolic function. The cavity was normal. There is no increase in right ventricular wall thickness.  3. Moderate mitral valve prolapse.  4. The mitral valve is abnormal. Mild thickening of the mitral valve leaflet. The MR jet is posteriorly-directed.  5. The tricuspid valve is grossly normal.  6. The aortic valve is tricuspid. No stenosis of the aortic valve.  7. The ascending aorta and aortic root are normal in size and structure.  8. When compared to the prior study: 10/08/17 EF 55-60. Trivial MR.    Echo Study Conclusions 09/2017   - Left ventricle: The cavity size was normal. There was mild focal   basal hypertrophy of the septum. Systolic function was normal.   The estimated ejection fraction was in the range of 55% to 60%.   Wall motion was normal; there were no regional wall motion   abnormalities. Doppler parameters are consistent with abnormal   left ventricular relaxation (grade 1 diastolic  dysfunction). - Aortic valve: There was trivial regurgitation. - Mitral valve: Mild prolapse, involving the anterior leaflet and   the posterior leaflet. - Left atrium: The atrium was mildly dilated.       Echo Study Conclusions 06/2017   - Left ventricle: The cavity size was normal. Systolic function was   normal. The estimated ejection fraction was in the range of 55%   to 60%. Wall motion was normal; there were no regional wall   motion abnormalities. Features are consistent with a pseudonormal   left ventricular filling pattern, with concomitant abnormal   relaxation and increased filling pressure (grade 2 diastolic   dysfunction). Doppler parameters are consistent with high   ventricular filling pressure. - Mitral valve: Mild, late systolicprolapse, involving the anterior   leaflet and the posterior leaflet. There was trivial   regurgitation. - Left atrium: The atrium was mildly dilated. - Right atrium: The atrium was mildly dilated. - Pulmonary arteries: PA peak pressure: 48 mm Hg (S).   Impressions:   - The right ventricular systolic pressure was increased consistent   with moderate pulmonary hypertension. Notes recorded by Burtis Junes, NP on 06/13/2017 at 3:15 PM EST I have called Shannon Brennan with the echo report. I have also had Dr. Acie Fredrickson review - he also feels she has mild MR and that the pulmonary HTN looks real.  She is having NO shortness of breath.  Will refer to pulmonary for further evaluation - Dr. Lamonte Sakai only.            GXT Study Highlights from 11/2015      Negative GXT  The patient walked for 6 minutes on a Bruce protocol GXT. The peak H R was 149 which is 97% predicted maximal HR . There were no ST or T wave changes to suggest ischemia.  Normal BP response   The patient walked for  a total of 6 minutes on a Bruce protocol GXT.   The patient achieved a peak HR of 149 which is 97%  predicted maximal HR.   There were no ST changes to suggest ischemia.    The BP response to exercise was normal.             Assessment/Plan:  1. MVP - last echo noted - doing well clinically - no changes made today.   2. Palpitations - on very low dose beta blocker and doing well.   3. Situational stress - seems better - encouraged her to let exercise help with her coping.   4. Prior concern for pulmonary HTN - this was not demonstrated on repeat studies. She has no worrisome symptoms.   5. HLD - on low dose statin. Labs by PCP.    Current medicines are reviewed with the patient today.  The patient does not have concerns regarding medicines other than what has been noted above.  The following changes have been made:  See above.  Labs/ tests ordered today include:   No orders of the defined types were placed in this encounter.    Disposition:   FU with Dr. Oval Linsey going forward (she sees her husband Shannon Brennan).    Patient is agreeable to this plan and will call if any problems develop in the interim.   SignedTruitt Merle, NP  05/11/2020 9:33 AM  [facility] [address] [place], [place]  [address] Phone: [phone] Fax: [phone]

## 2020-05-11 ENCOUNTER — Ambulatory Visit: Payer: Medicare Other | Admitting: Nurse Practitioner

## 2020-05-11 ENCOUNTER — Encounter: Payer: Self-pay | Admitting: Nurse Practitioner

## 2020-05-11 ENCOUNTER — Other Ambulatory Visit: Payer: Self-pay

## 2020-05-11 VITALS — BP 120/60 | HR 66 | Ht 63.5 in | Wt 129.0 lb

## 2020-05-11 DIAGNOSIS — I341 Nonrheumatic mitral (valve) prolapse: Secondary | ICD-10-CM | POA: Diagnosis not present

## 2020-05-11 DIAGNOSIS — R002 Palpitations: Secondary | ICD-10-CM

## 2020-05-11 DIAGNOSIS — E7800 Pure hypercholesterolemia, unspecified: Secondary | ICD-10-CM | POA: Diagnosis not present

## 2020-05-11 NOTE — Patient Instructions (Addendum)
After Visit Summary:  We will be checking the following labs today - NONE   Medication Instructions:    Continue with your current medicines.    If you need a refill on your cardiac medications before your next appointment, please call your pharmacy.     Testing/Procedures To Be Arranged:  N/A  Follow-Up:   See Dr. Skeet Latch in about 5 months    At [facility], you and your health needs are our priority.  As part of our continuing mission to provide you with exceptional heart care, we have created designated Provider Care Teams.  These Care Teams include your primary Cardiologist (physician) and Advanced Practice Providers (APPs -  Physician Assistants and Nurse Practitioners) who all work together to provide you with the care you need, when you need it.  Special Instructions:  . Stay safe, wash your hands for at least 20 seconds and wear a mask when needed.  . It was good to talk with you today.  . Thank you!   Call the [place] office at [phone] if you have any questions, problems or concerns.

## 2020-05-13 DIAGNOSIS — F4323 Adjustment disorder with mixed anxiety and depressed mood: Secondary | ICD-10-CM | POA: Diagnosis not present

## 2020-05-25 DIAGNOSIS — Z85828 Personal history of other malignant neoplasm of skin: Secondary | ICD-10-CM | POA: Diagnosis not present

## 2020-05-25 DIAGNOSIS — D225 Melanocytic nevi of trunk: Secondary | ICD-10-CM | POA: Diagnosis not present

## 2020-05-25 DIAGNOSIS — D2262 Melanocytic nevi of left upper limb, including shoulder: Secondary | ICD-10-CM | POA: Diagnosis not present

## 2020-05-25 DIAGNOSIS — D2239 Melanocytic nevi of other parts of face: Secondary | ICD-10-CM | POA: Diagnosis not present

## 2020-05-25 DIAGNOSIS — D1801 Hemangioma of skin and subcutaneous tissue: Secondary | ICD-10-CM | POA: Diagnosis not present

## 2020-05-27 DIAGNOSIS — F4323 Adjustment disorder with mixed anxiety and depressed mood: Secondary | ICD-10-CM | POA: Diagnosis not present

## 2020-06-24 DIAGNOSIS — F4323 Adjustment disorder with mixed anxiety and depressed mood: Secondary | ICD-10-CM | POA: Diagnosis not present

## 2020-07-05 DIAGNOSIS — R059 Cough, unspecified: Secondary | ICD-10-CM | POA: Diagnosis not present

## 2020-07-05 DIAGNOSIS — J309 Allergic rhinitis, unspecified: Secondary | ICD-10-CM | POA: Diagnosis not present

## 2020-07-05 DIAGNOSIS — U071 COVID-19: Secondary | ICD-10-CM | POA: Diagnosis not present

## 2020-07-08 DIAGNOSIS — F4323 Adjustment disorder with mixed anxiety and depressed mood: Secondary | ICD-10-CM | POA: Diagnosis not present

## 2020-07-20 DIAGNOSIS — M25511 Pain in right shoulder: Secondary | ICD-10-CM | POA: Diagnosis not present

## 2020-07-20 DIAGNOSIS — M7551 Bursitis of right shoulder: Secondary | ICD-10-CM | POA: Diagnosis not present

## 2020-07-22 DIAGNOSIS — F4323 Adjustment disorder with mixed anxiety and depressed mood: Secondary | ICD-10-CM | POA: Diagnosis not present

## 2020-08-05 DIAGNOSIS — F4323 Adjustment disorder with mixed anxiety and depressed mood: Secondary | ICD-10-CM | POA: Diagnosis not present

## 2020-08-19 DIAGNOSIS — F4323 Adjustment disorder with mixed anxiety and depressed mood: Secondary | ICD-10-CM | POA: Diagnosis not present

## 2020-09-02 DIAGNOSIS — F4323 Adjustment disorder with mixed anxiety and depressed mood: Secondary | ICD-10-CM | POA: Diagnosis not present

## 2020-09-03 DIAGNOSIS — L03113 Cellulitis of right upper limb: Secondary | ICD-10-CM | POA: Diagnosis not present

## 2020-09-03 DIAGNOSIS — S40861A Insect bite (nonvenomous) of right upper arm, initial encounter: Secondary | ICD-10-CM | POA: Diagnosis not present

## 2020-09-03 DIAGNOSIS — W57XXXA Bitten or stung by nonvenomous insect and other nonvenomous arthropods, initial encounter: Secondary | ICD-10-CM | POA: Diagnosis not present

## 2020-09-08 DIAGNOSIS — M810 Age-related osteoporosis without current pathological fracture: Secondary | ICD-10-CM | POA: Diagnosis not present

## 2020-09-08 DIAGNOSIS — E785 Hyperlipidemia, unspecified: Secondary | ICD-10-CM | POA: Diagnosis not present

## 2020-09-16 DIAGNOSIS — Z1212 Encounter for screening for malignant neoplasm of rectum: Secondary | ICD-10-CM | POA: Diagnosis not present

## 2020-09-16 DIAGNOSIS — I341 Nonrheumatic mitral (valve) prolapse: Secondary | ICD-10-CM | POA: Diagnosis not present

## 2020-09-16 DIAGNOSIS — Z0000 Encounter for general adult medical examination without abnormal findings: Secondary | ICD-10-CM | POA: Diagnosis not present

## 2020-09-16 DIAGNOSIS — E785 Hyperlipidemia, unspecified: Secondary | ICD-10-CM | POA: Diagnosis not present

## 2020-09-16 DIAGNOSIS — R82998 Other abnormal findings in urine: Secondary | ICD-10-CM | POA: Diagnosis not present

## 2020-09-16 DIAGNOSIS — I872 Venous insufficiency (chronic) (peripheral): Secondary | ICD-10-CM | POA: Diagnosis not present

## 2020-09-30 DIAGNOSIS — F4323 Adjustment disorder with mixed anxiety and depressed mood: Secondary | ICD-10-CM | POA: Diagnosis not present

## 2020-10-14 DIAGNOSIS — F4323 Adjustment disorder with mixed anxiety and depressed mood: Secondary | ICD-10-CM | POA: Diagnosis not present

## 2020-10-19 DIAGNOSIS — B373 Candidiasis of vulva and vagina: Secondary | ICD-10-CM | POA: Diagnosis not present

## 2020-10-19 DIAGNOSIS — R309 Painful micturition, unspecified: Secondary | ICD-10-CM | POA: Diagnosis not present

## 2020-10-19 DIAGNOSIS — N898 Other specified noninflammatory disorders of vagina: Secondary | ICD-10-CM | POA: Diagnosis not present

## 2020-10-21 ENCOUNTER — Other Ambulatory Visit: Payer: Self-pay

## 2020-10-21 ENCOUNTER — Encounter: Payer: Self-pay | Admitting: Cardiovascular Disease

## 2020-10-21 ENCOUNTER — Ambulatory Visit: Payer: Medicare Other | Admitting: Cardiovascular Disease

## 2020-10-21 DIAGNOSIS — E7800 Pure hypercholesterolemia, unspecified: Secondary | ICD-10-CM

## 2020-10-21 DIAGNOSIS — R002 Palpitations: Secondary | ICD-10-CM | POA: Diagnosis not present

## 2020-10-21 DIAGNOSIS — I341 Nonrheumatic mitral (valve) prolapse: Secondary | ICD-10-CM | POA: Diagnosis not present

## 2020-10-21 NOTE — Patient Instructions (Signed)
Medication Instructions:  Your physician recommends that you continue on your current medications as directed. Please refer to the Current Medication list given to you today.   *If you need a refill on your cardiac medications before your next appointment, please call your pharmacy*  Lab Work: NONE   Testing/Procedures: NONE   Follow-Up: At [facility], you and your health needs are our priority.  As part of our continuing mission to provide you with exceptional heart care, we have created designated Provider Care Teams.  These Care Teams include your primary Cardiologist (physician) and Advanced Practice Providers (APPs -  Physician Assistants and Nurse Practitioners) who all work together to provide you with the care you need, when you need it.  We recommend signing up for the patient portal called "MyChart".  Sign up information is provided on this After Visit Summary.  MyChart is used to connect with patients for Virtual Visits (Telemedicine).  Patients are able to view lab/test results, encounter notes, upcoming appointments, etc.  Non-urgent messages can be sent to your provider as well.   To learn more about what you can do with MyChart, go to [email or web address].    Your next appointment:   12 month(s)  The format for your next appointment:   In Person  Provider:   DR Washington

## 2020-10-21 NOTE — Assessment & Plan Note (Signed)
Well-controlled on atenolol.

## 2020-10-21 NOTE — Progress Notes (Addendum)
Cardiology Office Note:    Date:  10/21/2020   ID:  Shannon Brennan, DOB 1946/03/30, MRN [id number]  PCP:  Prince Solian, MD   [facility] Providers Cardiologist:  Skeet Latch, MD     Referring MD: Prince Solian, MD   No chief complaint on file. CC: Establish care  History of Present Illness:    Shannon Brennan is a 74 y.o. female with a hx of arthritis, diverticulosis, hepatic hemangioma, osteopenia, hyperlipidemia, hypertension, moderate pulmonary hypertension, mitral valve prolapse, who presents today for follow-up. She was previously a patient of Dr. Karna Christmas, and she was followed by Truitt Merle, NP since that time. She has mitral valve prolapse with mild mitral regurgitation. She had an Echo in 2017 that was negative for ischemia. There was concern for pulmonary hypertension, and she was referred to pulmonary. However, on repeat her echo appeared normal.  Today, she reports recently being dx with a UTI, but is overall feeling okay. In 06/2020 she was infected with COVID. For exercise, she has not been very active in the past 2 years due to COVID restrictions. However, she does sometimes walk for 30 minutes in her neighborhood and is only short of breath while walking up a steep incline. If she is very active in one day, she will feel mild "flutters" after lying down.  They are brief and there is no associated presyncope.  She reports having restless leg syndrome for years, and this has been affecting her sleep quality.  She endorses feeling "jumpiness" in her LE's but denies aches or pain. She didn't tolerate higher doses of atorvastatin 2/2 myalgias.  At night she notices she has some bilateral LE edema, but this will resolve in the morning. For her diet, she is not always mindful of her sodium intake. For her regimen she is taking atenolol for both palpitations and hypertension. She notes that some otc medications will keep her awake at night. She denies any chest pain,  headaches, lightheadedness, or syncope. Also has no lower extremity edema, orthopnea or PND.   Past Medical History:  Diagnosis Date   Allergic rhinitis    Arthritis    mild in right thumb   Diverticulosis    Family history of cardiovascular disease    Hepatic hemangioma    history of -- followe by Dr. Cristina Gong   Hx of osteopenia    Hyperlipidemia    tolerates low dose Lipitor   Hypertension    controlled with atenolol   Insomnia    Mitral valve prolapse    last echo in 2005 showing mild MVP with normal LV function   Normal nuclear stress test 2006   Palpitations    Pyuria    UTI (urinary tract infection)    Vaginal atrophy     Past Surgical History:  Procedure Laterality Date   ABDOMINAL HYSTERECTOMY     LUMBAR LAMINECTOMY/DECOMPRESSION MICRODISCECTOMY  01/04/2012   Procedure: LUMBAR LAMINECTOMY/DECOMPRESSION MICRODISCECTOMY 1 LEVEL;  Surgeon: Kristeen Miss, MD;  Location: [facility];  Service: Neurosurgery;  Laterality: Left;  Left Lumbar Five-Sacral One Microdiskectomy   PARTIAL HYSTERECTOMY     TONSILLECTOMY      Current Medications: Current Meds  Medication Sig   atenolol (TENORMIN) 25 MG tablet TAKE 1/2 TABLET(12.5 MG) BY MOUTH DAILY   atorvastatin (LIPITOR) 10 MG tablet Take 0.5 tablets (5 mg total) by mouth 3 (three) times a week.   cetirizine (ZYRTEC) 10 MG tablet Take 1 tablet by mouth daily.   ciprofloxacin (CIPRO)  250 MG tablet SMARTSIG:1 Tablet(s) By Mouth Every 12 Hours   fluconazole (DIFLUCAN) 150 MG tablet Take 1 tablet by mouth.   furosemide (LASIX) 20 MG tablet Take 20 mg by mouth daily as needed.   Nutritional Supplements (MELATONIN PO) Take 10 mg by mouth at bedtime.   nystatin-triamcinolone ointment (MYCOLOG) SMARTSIG:Sparingly Topical Twice Daily PRN   Probiotic Product (ALIGN PO) Take by mouth in the morning and at bedtime. 2 tabs   traZODone (DESYREL) 50 MG tablet Take 100 mg by mouth daily.     Allergies:   Erythromycin, Flagyl [metronidazole  hcl], Levofloxacin, Wellbutrin [bupropion hcl], Zoloft [sertraline hcl], and Restoril   Social History   Socioeconomic History   Marital status: Married    Spouse name: Not on file   Number of children: Not on file   Years of education: Not on file   Highest education level: Not on file  Occupational History   Not on file  Tobacco Use   Smoking status: Never   Smokeless tobacco: Never  Vaping Use   Vaping Use: Never used  Substance and Sexual Activity   Alcohol use: No   Drug use: No   Sexual activity: Never    Birth control/protection: Surgical  Other Topics Concern   Not on file  Social History Narrative   Not on file   Social Determinants of Health   Financial Resource Strain: Low Risk    Difficulty of Paying Living Expenses: Not hard at all  Food Insecurity: No Food Insecurity   Worried About Charity fundraiser in the Last Year: Never true   Dibble in the Last Year: Never true  Transportation Needs: No Transportation Needs   Lack of Transportation (Medical): No   Lack of Transportation (Non-Medical): No  Physical Activity: Sufficiently Active   Days of Exercise per Week: 5 days   Minutes of Exercise per Session: 30 min  Stress: Stress Concern Present   Feeling of Stress : To some extent  Social Connections: Not on file     Family History: The patient's family history includes Arthritis in her father and mother; Cancer in her father and mother; Coronary artery disease in her brother; Diabetes in her brother; Heart attack in her brother; Hyperlipidemia in her mother; Hypertension in her brother, brother, and mother; Multiple sclerosis in her brother; Osteopenia in her mother; Stroke in her mother; Ulcers in her mother.  ROS:   Please see the history of present illness.    (+) Shortness of breath (+) Mild palpitations (+) Bilateral LE "jumpiness" (+) Bilateral LE edema All other systems reviewed and are negative.  EKGs/Labs/Other Studies Reviewed:     The following studies were reviewed today:  Echo 11/15/2018: 1. The left ventricle has normal systolic function with an ejection  fraction of 60-65%. The cavity size was normal. Left ventricular diastolic  Doppler parameters are consistent with impaired relaxation. No evidence of  left ventricular regional wall  motion abnormalities.   2. The right ventricle has normal systolic function. The cavity was  normal. There is no increase in right ventricular wall thickness.   3. Moderate mitral valve prolapse.   4. The mitral valve is abnormal. Mild thickening of the mitral valve  leaflet. The MR jet is posteriorly-directed.   5. The tricuspid valve is grossly normal.   6. The aortic valve is tricuspid. No stenosis of the aortic valve.   7. The ascending aorta and aortic root are normal in size and  structure.   8. When compared to the prior study: 10/08/17 EF 55-60. Trivial MR.   Echo 10/08/2017: - Left ventricle: The cavity size was normal. There was mild focal    basal hypertrophy of the septum. Systolic function was normal.    The estimated ejection fraction was in the range of 55% to 60%.    Wall motion was normal; there were no regional wall motion    abnormalities. Doppler parameters are consistent with abnormal    left ventricular relaxation (grade 1 diastolic dysfunction).  - Aortic valve: There was trivial regurgitation.  - Mitral valve: Mild prolapse, involving the anterior leaflet and    the posterior leaflet.  - Left atrium: The atrium was mildly dilated.   Echo 06/12/2017: - Left ventricle: The cavity size was normal. Systolic function was    normal. The estimated ejection fraction was in the range of 55%    to 60%. Wall motion was normal; there were no regional wall    motion abnormalities. Features are consistent with a pseudonormal    left ventricular filling pattern, with concomitant abnormal    relaxation and increased filling pressure (grade 2 diastolic     dysfunction). Doppler parameters are consistent with high    ventricular filling pressure.  - Mitral valve: Mild, late systolicprolapse, involving the anterior    leaflet and the posterior leaflet. There was trivial    regurgitation.  - Left atrium: The atrium was mildly dilated.  - Right atrium: The atrium was mildly dilated.  - Pulmonary arteries: PA peak pressure: 48 mm Hg (S).   Impressions:  - The right ventricular systolic pressure was increased consistent    with moderate pulmonary hypertension.   ETT 12/28/2015: Negative GXT The patient walked for 6 minutes on a Bruce protocol GXT. The peak H R was 149 which is 97% predicted maximal HR . There were no ST or T wave changes to suggest ischemia. Normal BP response   The patient walked for a total of 6 minutes on a Bruce protocol GXT.   The patient achieved a peak HR of 149 which is 97%  predicted maximal HR.   There were no ST changes to suggest ischemia.   The BP response to exercise was normal.  EKG:   10/21/2020: Sinus rhythm. Rate 66 bpm.  Recent Labs: No results found for requested labs within last 8760 hours.  Recent Lipid Panel    Component Value Date/Time   CHOL 187 05/13/2019 0943   TRIG 94 05/13/2019 0943   HDL 72 05/13/2019 0943   CHOLHDL 2.6 05/13/2019 0943   CHOLHDL 2.3 07/14/2015 0917   VLDL 18 07/14/2015 0917   LDLCALC 98 05/13/2019 0943   LDLDIRECT 133.0 02/05/2013 1512    Physical Exam:    VS:  BP 118/62   Pulse 66   Ht 5\' 3"  (1.6 m)   Wt 133 lb 3.2 oz (60.4 kg)   BMI 23.60 kg/m  , BMI Body mass index is 23.6 kg/m. GENERAL:  Well appearing HEENT: Pupils equal round and reactive, fundi not visualized, oral mucosa unremarkable NECK:  No jugular venous distention, waveform within normal limits, carotid upstroke brisk and symmetric, no bruits LUNGS:  Clear to auscultation bilaterally HEART:  RRR.  PMI not displaced or sustained,S1 and S2 within normal limits, no S3, no S4, no clicks, no rubs, mid  systolic click.  II/VI systolic murmurs ABD:  Flat, positive bowel sounds normal in frequency in pitch, no bruits, no rebound, no guarding,  no midline pulsatile mass, no hepatomegaly, no splenomegaly EXT:  2 plus pulses throughout, no edema, no cyanosis no clubbing SKIN:  No rashes no nodules NEURO:  Cranial nerves II through XII grossly intact, motor grossly intact throughout PSYCH:  Cognitively intact, oriented to person place and time   ASSESSMENT:    1. Mitral valve prolapse   2. Hypercholesterolemia   3. Palpitations    PLAN:    Mitral valve prolapse Moderate mitral valve prolapse seen on numerous echoes.  She has trivial to mild mitral regurgitation and is euvolemic on exam.  Continue furosemide as needed for lower extremity edema, though this is more likely due to venous insufficiency than her mitral valve.  Hypercholesterolemia Lipids are slightly elevated.  Her LDL is 114.  She is going to work on diet and exercise rather than starting any new medications.  Continue atorvastatin.  She has not tolerated higher doses.  Palpitations Well-controlled on atenolol.   Disposition: Follow-up with Shalee Paolo C. Oval Linsey, MD, [facility] in 1 year.   Medication Adjustments/Labs and Tests Ordered: Current medicines are reviewed at length with the patient today.  Concerns regarding medicines are outlined above.  No orders of the defined types were placed in this encounter.  No orders of the defined types were placed in this encounter.   Patient Instructions  Medication Instructions:  Your physician recommends that you continue on your current medications as directed. Please refer to the Current Medication list given to you today.   *If you need a refill on your cardiac medications before your next appointment, please call your pharmacy*  Lab Work: NONE   Testing/Procedures: NONE   Follow-Up: At [facility], you and your health needs are our priority.  As part of our continuing  mission to provide you with exceptional heart care, we have created designated Provider Care Teams.  These Care Teams include your primary Cardiologist (physician) and Advanced Practice Providers (APPs -  Physician Assistants and Nurse Practitioners) who all work together to provide you with the care you need, when you need it.  We recommend signing up for the patient portal called "MyChart".  Sign up information is provided on this After Visit Summary.  MyChart is used to connect with patients for Virtual Visits (Telemedicine).  Patients are able to view lab/test results, encounter notes, upcoming appointments, etc.  Non-urgent messages can be sent to your provider as well.   To learn more about what you can do with MyChart, go to [email or web address].    Your next appointment:   12 month(s)  The format for your next appointment:   In Person  Provider:   DR [facility] [place]      I,Mathew Stumpf,acting as a scribe for Skeet Latch, MD.,have documented all relevant documentation on the behalf of Skeet Latch, MD,as directed by  Skeet Latch, MD while in the presence of Skeet Latch, MD.  I, [place] Oval Linsey, MD have reviewed all documentation for this visit.  The documentation of the exam, diagnosis, procedures, and orders on 10/21/2020 are all accurate and complete.   Signed, Skeet Latch, MD  10/21/2020 3:59 PM    [facility]

## 2020-10-21 NOTE — Assessment & Plan Note (Signed)
Lipids are slightly elevated.  Her LDL is 114.  She is going to work on diet and exercise rather than starting any new medications.  Continue atorvastatin.  She has not tolerated higher doses.

## 2020-10-21 NOTE — Assessment & Plan Note (Signed)
Moderate mitral valve prolapse seen on numerous echoes.  She has trivial to mild mitral regurgitation and is euvolemic on exam.  Continue furosemide as needed for lower extremity edema, though this is more likely due to venous insufficiency than her mitral valve.

## 2020-11-01 IMAGING — US ULTRASOUND ABDOMEN LIMITED
1 series · 13 of 25 positions shown · non-contrast
Comparison: June 19, 2012 and July 20, 2015

CLINICAL DATA: Follow-up liver lesions.

EXAM:
ULTRASOUND ABDOMEN LIMITED RIGHT UPPER QUADRANT

[Series 1: ultrasound abdomen limited · 0.20mm/px · 13 of 51 slices shown]
[im 1/51]
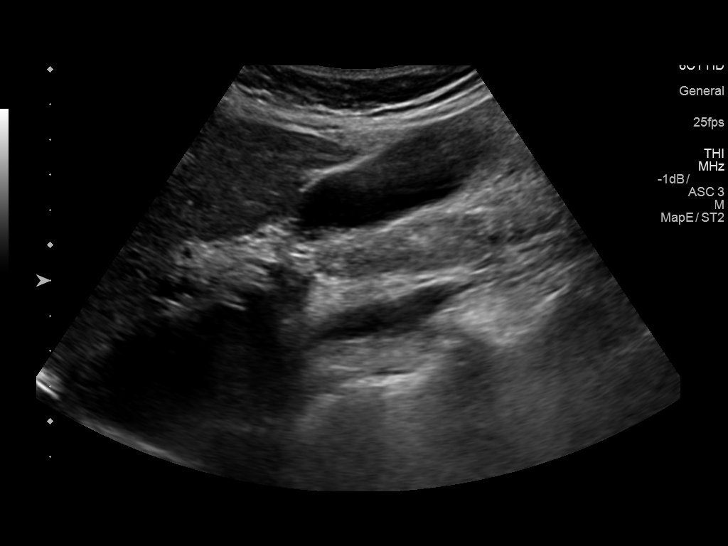
[im 5/51]
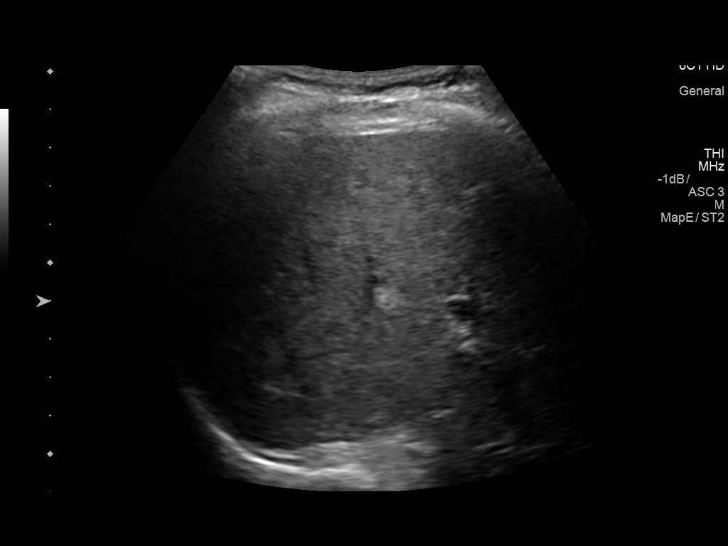
[im 9/51]
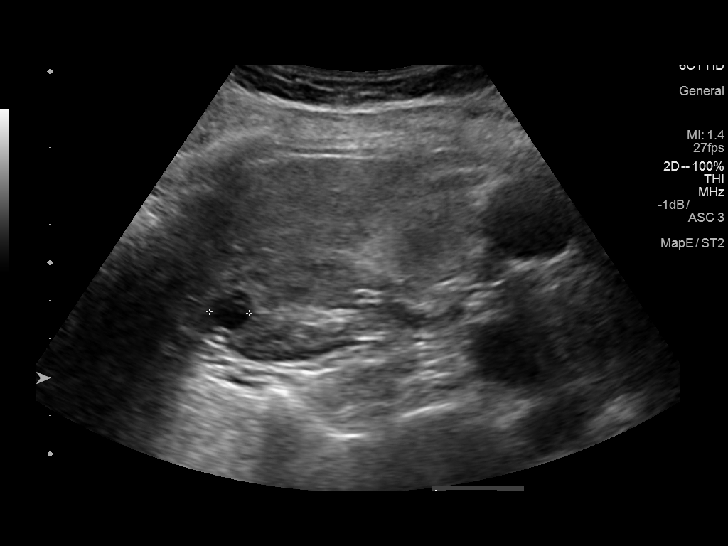
[im 13/51]
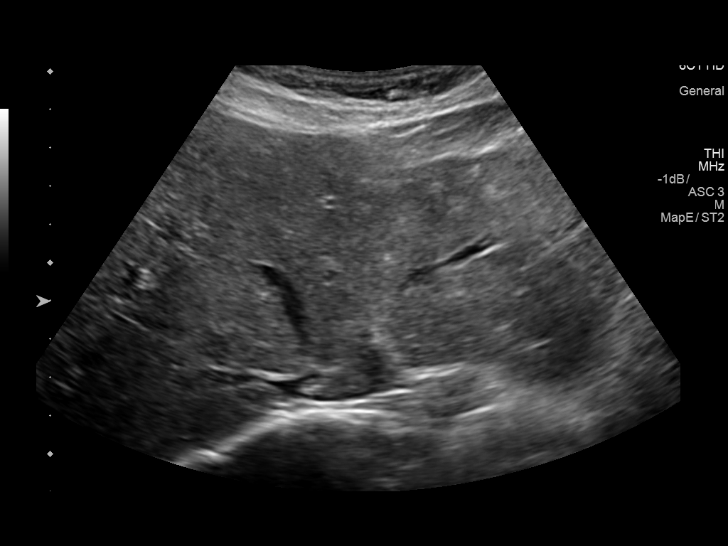
[im 17/51]
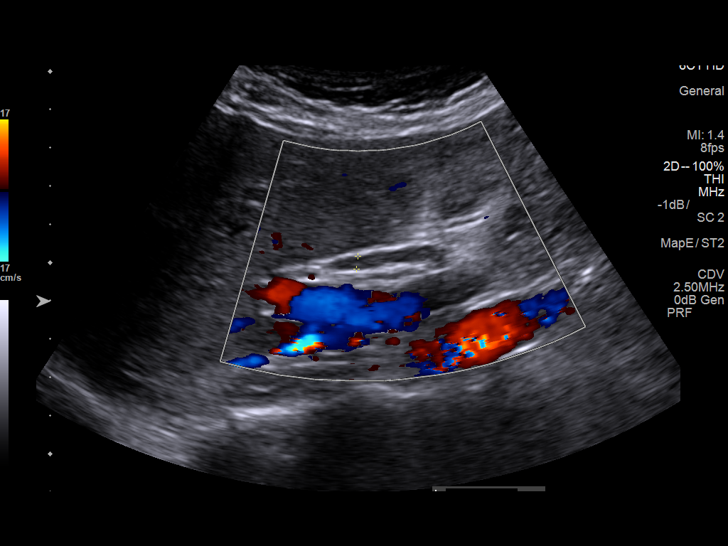
[im 21/51]
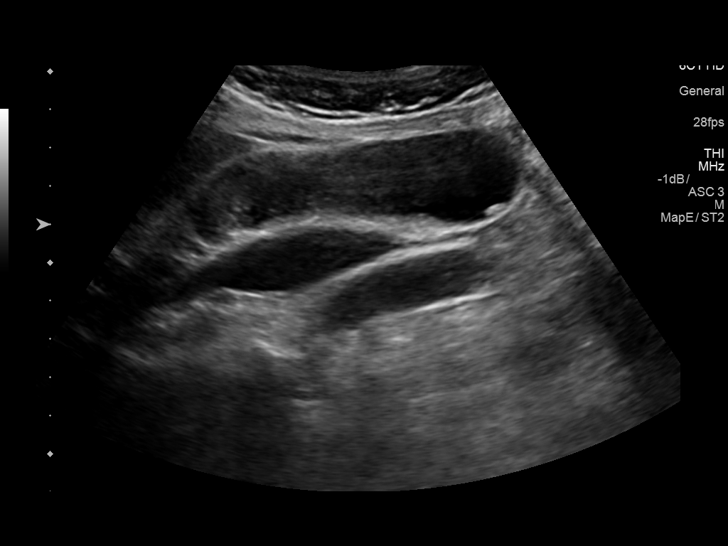
[im 26/51]
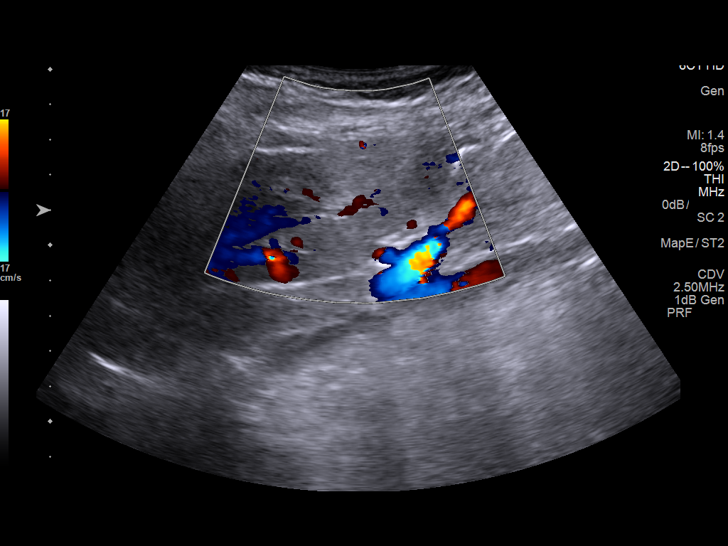
[im 30/51]
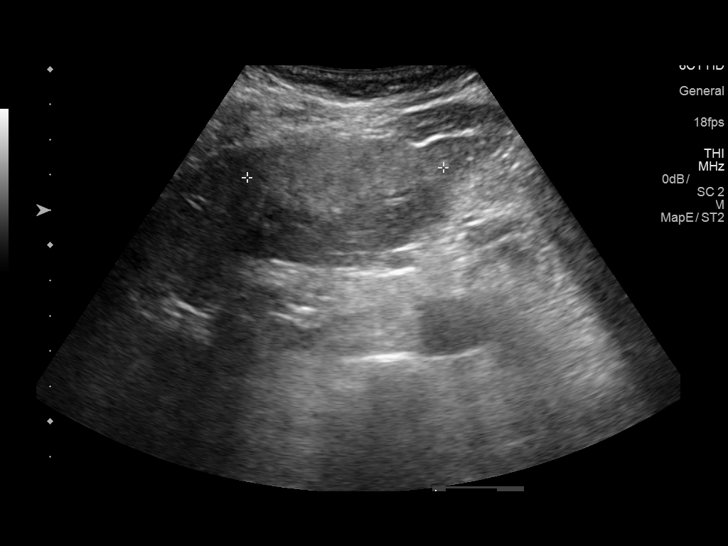
[im 34/51]
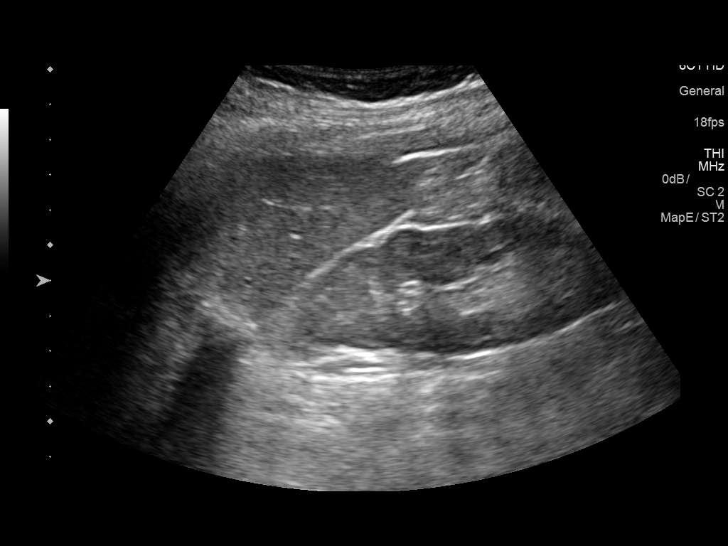
[im 38/51]
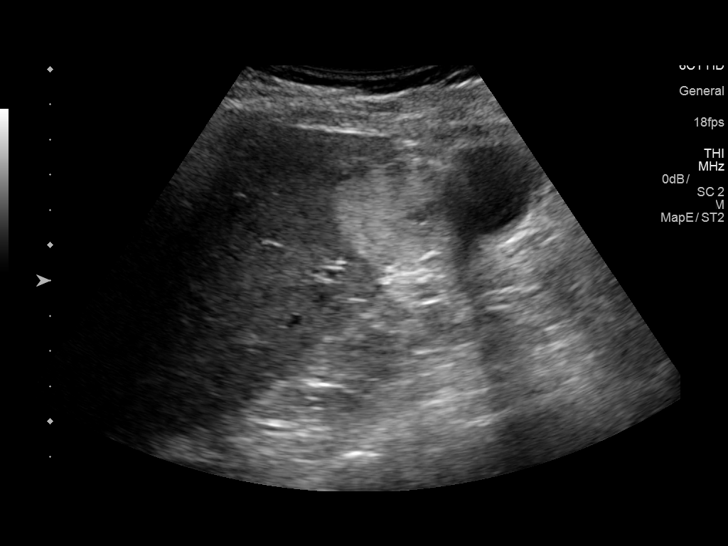
[im 42/51]
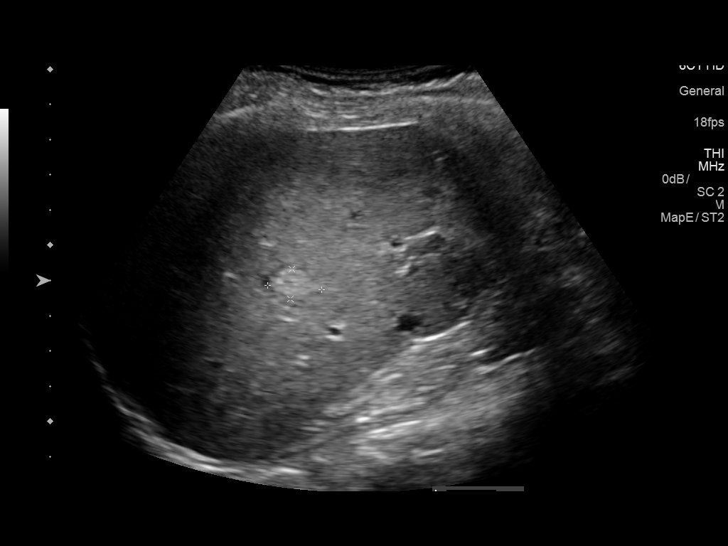
[im 46/51]
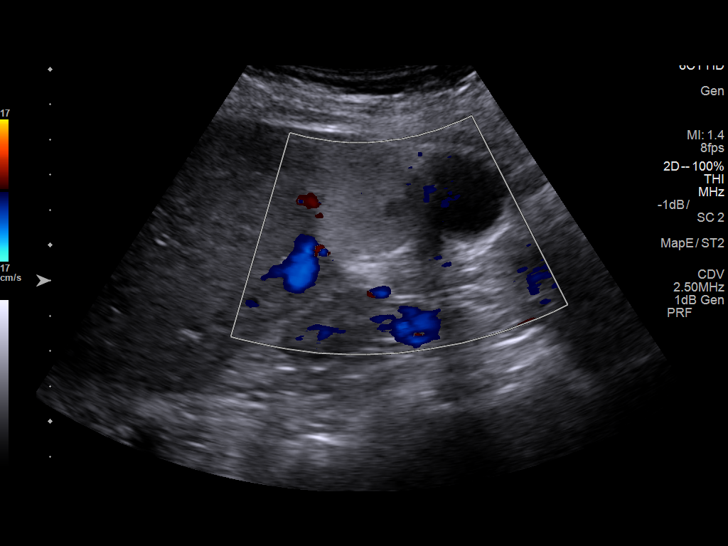
[im 51/51]
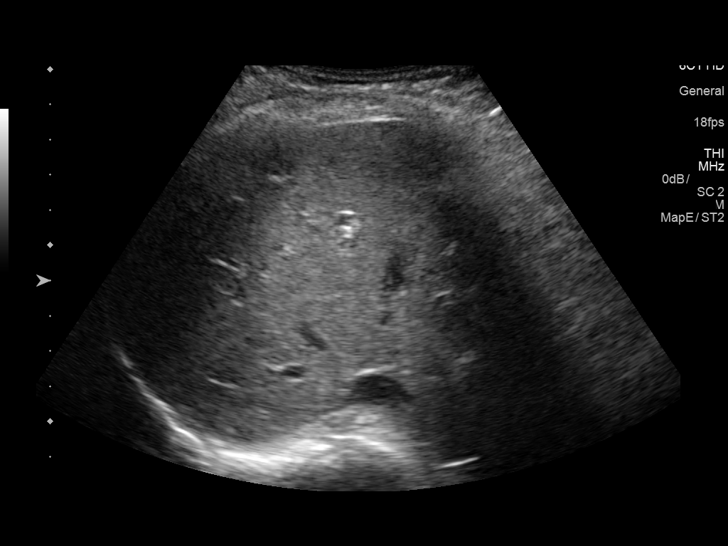

[13 of 25 positions shown; findings below may reference images not displayed]

FINDINGS: Gallbladder:

There is a 7.2 mm non mobile nonshadowing polyp in the gallbladder.
No stones, sludge, wall thickening, pericholecystic fluid, or
Murphy's sign.

Common bile duct:

Diameter: 3.4 mm

Liver:

Again noted are 3 masses in the liver. The mass in the left hepatic
lobe measures 3.0 x 2.1 x 5.6 cm today versus 4.6 x 3.5 by 4.3 cm in
Friday June, 2012 and 4.1 x 2.5 x 3.1 cm in Tuesday June, 2015. A mass
adjacent to the gallbladder measures 3.7 x 3.3 x 3.5 cm today versus
5.0 x 4.1 by 4.1 cm in Tuesday June, 2015. The smaller right hepatic mass
measures 1.5 x 0.9 x 1.0 cm today versus 1.1 x 1.0 x 0.9 cm in 6584
and 1.1 x 1.1 x 1.1 cm in 7481. No new masses in the liver. The
liver demonstrates mild increased coarsened echotexture. Portal vein
is patent on color Doppler imaging with normal direction of blood
flow towards the liver.
IMPRESSION: 1. Again noted are 3 masses within the liver. The left hepatic mass
is smaller in 2 dimensions and larger in 1 dimension. The difference
in measurements may be technical as the overall volume is similar in
the interval. The mass adjacent to the gallbladder fossa is smaller
since 7481 and 6584. The smaller mass in the right hepatic lobe is
slightly larger since 7481 and 6584. The hyperechoic nature and slow
growth over greater than 6 years suggests a benign etiology such as
hemangiomas.
2. There is a 7.2 mm polyp in the gallbladder, not seen on previous
studies. This could represent a benign adenomatous polyp versus a
small cancer. Given that the polyp is new but small, recommend
surgical consultation. If not surgically removed, recommend a
follow-up ultrasound in 6-12 months.

These results will be called to the ordering clinician or
representative by the Radiologist Assistant, and communication
documented in the PACS or zVision Dashboard.

## 2020-11-04 DIAGNOSIS — H5213 Myopia, bilateral: Secondary | ICD-10-CM | POA: Diagnosis not present

## 2020-11-10 DIAGNOSIS — Z1231 Encounter for screening mammogram for malignant neoplasm of breast: Secondary | ICD-10-CM | POA: Diagnosis not present

## 2020-11-11 DIAGNOSIS — F4323 Adjustment disorder with mixed anxiety and depressed mood: Secondary | ICD-10-CM | POA: Diagnosis not present

## 2020-11-23 DIAGNOSIS — Z85828 Personal history of other malignant neoplasm of skin: Secondary | ICD-10-CM | POA: Diagnosis not present

## 2020-11-23 DIAGNOSIS — D1801 Hemangioma of skin and subcutaneous tissue: Secondary | ICD-10-CM | POA: Diagnosis not present

## 2020-11-23 DIAGNOSIS — L821 Other seborrheic keratosis: Secondary | ICD-10-CM | POA: Diagnosis not present

## 2020-11-23 DIAGNOSIS — D2262 Melanocytic nevi of left upper limb, including shoulder: Secondary | ICD-10-CM | POA: Diagnosis not present

## 2020-11-24 DIAGNOSIS — F4323 Adjustment disorder with mixed anxiety and depressed mood: Secondary | ICD-10-CM | POA: Diagnosis not present

## 2020-12-06 ENCOUNTER — Other Ambulatory Visit: Payer: Self-pay | Admitting: Gastroenterology

## 2020-12-06 DIAGNOSIS — K769 Liver disease, unspecified: Secondary | ICD-10-CM

## 2020-12-06 DIAGNOSIS — K824 Cholesterolosis of gallbladder: Secondary | ICD-10-CM

## 2020-12-09 DIAGNOSIS — F4323 Adjustment disorder with mixed anxiety and depressed mood: Secondary | ICD-10-CM | POA: Diagnosis not present

## 2020-12-17 ENCOUNTER — Ambulatory Visit
Admission: RE | Admit: 2020-12-17 | Discharge: 2020-12-17 | Disposition: A | Discharge status: Home / Self Care | Payer: Medicare Other | Source: Ambulatory Visit | Attending: Gastroenterology | Admitting: Gastroenterology

## 2020-12-17 DIAGNOSIS — K769 Liver disease, unspecified: Secondary | ICD-10-CM

## 2020-12-17 DIAGNOSIS — K828 Other specified diseases of gallbladder: Secondary | ICD-10-CM | POA: Diagnosis not present

## 2020-12-17 DIAGNOSIS — K824 Cholesterolosis of gallbladder: Secondary | ICD-10-CM

## 2020-12-22 DIAGNOSIS — F4323 Adjustment disorder with mixed anxiety and depressed mood: Secondary | ICD-10-CM | POA: Diagnosis not present

## 2021-01-06 DIAGNOSIS — F4323 Adjustment disorder with mixed anxiety and depressed mood: Secondary | ICD-10-CM | POA: Diagnosis not present

## 2021-01-11 ENCOUNTER — Other Ambulatory Visit: Payer: Self-pay

## 2021-01-11 MED ORDER — ATENOLOL 25 MG PO TABS: ORAL_TABLET | ORAL | 2 refills | Status: DC

## 2021-01-20 DIAGNOSIS — F4323 Adjustment disorder with mixed anxiety and depressed mood: Secondary | ICD-10-CM | POA: Diagnosis not present

## 2021-02-03 DIAGNOSIS — F4323 Adjustment disorder with mixed anxiety and depressed mood: Secondary | ICD-10-CM | POA: Diagnosis not present

## 2021-02-17 DIAGNOSIS — F4323 Adjustment disorder with mixed anxiety and depressed mood: Secondary | ICD-10-CM | POA: Diagnosis not present

## 2021-03-03 DIAGNOSIS — F4323 Adjustment disorder with mixed anxiety and depressed mood: Secondary | ICD-10-CM | POA: Diagnosis not present

## 2021-03-17 DIAGNOSIS — F4323 Adjustment disorder with mixed anxiety and depressed mood: Secondary | ICD-10-CM | POA: Diagnosis not present

## 2021-03-22 DIAGNOSIS — E785 Hyperlipidemia, unspecified: Secondary | ICD-10-CM | POA: Diagnosis not present

## 2021-03-22 DIAGNOSIS — I872 Venous insufficiency (chronic) (peripheral): Secondary | ICD-10-CM | POA: Diagnosis not present

## 2021-03-22 DIAGNOSIS — I341 Nonrheumatic mitral (valve) prolapse: Secondary | ICD-10-CM | POA: Diagnosis not present

## 2021-03-22 DIAGNOSIS — M7551 Bursitis of right shoulder: Secondary | ICD-10-CM | POA: Diagnosis not present

## 2021-03-31 DIAGNOSIS — F4323 Adjustment disorder with mixed anxiety and depressed mood: Secondary | ICD-10-CM | POA: Diagnosis not present

## 2021-04-14 DIAGNOSIS — F4323 Adjustment disorder with mixed anxiety and depressed mood: Secondary | ICD-10-CM | POA: Diagnosis not present

## 2021-05-05 DIAGNOSIS — F4323 Adjustment disorder with mixed anxiety and depressed mood: Secondary | ICD-10-CM | POA: Diagnosis not present

## 2021-05-19 DIAGNOSIS — F4323 Adjustment disorder with mixed anxiety and depressed mood: Secondary | ICD-10-CM | POA: Diagnosis not present

## 2021-06-01 DIAGNOSIS — I341 Nonrheumatic mitral (valve) prolapse: Secondary | ICD-10-CM | POA: Diagnosis not present

## 2021-06-01 DIAGNOSIS — E7800 Pure hypercholesterolemia, unspecified: Secondary | ICD-10-CM | POA: Diagnosis not present

## 2021-06-01 DIAGNOSIS — U071 COVID-19: Secondary | ICD-10-CM | POA: Diagnosis not present

## 2021-06-13 DIAGNOSIS — H04123 Dry eye syndrome of bilateral lacrimal glands: Secondary | ICD-10-CM | POA: Diagnosis not present

## 2021-06-16 DIAGNOSIS — F4323 Adjustment disorder with mixed anxiety and depressed mood: Secondary | ICD-10-CM | POA: Diagnosis not present

## 2021-06-30 DIAGNOSIS — F4323 Adjustment disorder with mixed anxiety and depressed mood: Secondary | ICD-10-CM | POA: Diagnosis not present

## 2021-07-11 DIAGNOSIS — H04123 Dry eye syndrome of bilateral lacrimal glands: Secondary | ICD-10-CM | POA: Diagnosis not present

## 2021-07-14 DIAGNOSIS — F4323 Adjustment disorder with mixed anxiety and depressed mood: Secondary | ICD-10-CM | POA: Diagnosis not present

## 2021-07-28 DIAGNOSIS — F4323 Adjustment disorder with mixed anxiety and depressed mood: Secondary | ICD-10-CM | POA: Diagnosis not present

## 2021-08-01 DIAGNOSIS — H04123 Dry eye syndrome of bilateral lacrimal glands: Secondary | ICD-10-CM | POA: Diagnosis not present

## 2021-08-11 DIAGNOSIS — F4323 Adjustment disorder with mixed anxiety and depressed mood: Secondary | ICD-10-CM | POA: Diagnosis not present

## 2021-08-25 DIAGNOSIS — F4323 Adjustment disorder with mixed anxiety and depressed mood: Secondary | ICD-10-CM | POA: Diagnosis not present

## 2021-09-01 DIAGNOSIS — Z0142 Encounter for cervical smear to confirm findings of recent normal smear following initial abnormal smear: Secondary | ICD-10-CM | POA: Diagnosis not present

## 2021-09-01 DIAGNOSIS — Z01419 Encounter for gynecological examination (general) (routine) without abnormal findings: Secondary | ICD-10-CM | POA: Diagnosis not present

## 2021-09-01 DIAGNOSIS — Z01411 Encounter for gynecological examination (general) (routine) with abnormal findings: Secondary | ICD-10-CM | POA: Diagnosis not present

## 2021-09-01 DIAGNOSIS — Z124 Encounter for screening for malignant neoplasm of cervix: Secondary | ICD-10-CM | POA: Diagnosis not present

## 2021-09-08 DIAGNOSIS — F4323 Adjustment disorder with mixed anxiety and depressed mood: Secondary | ICD-10-CM | POA: Diagnosis not present

## 2021-09-12 DIAGNOSIS — H04123 Dry eye syndrome of bilateral lacrimal glands: Secondary | ICD-10-CM | POA: Diagnosis not present

## 2021-09-22 DIAGNOSIS — F4323 Adjustment disorder with mixed anxiety and depressed mood: Secondary | ICD-10-CM | POA: Diagnosis not present

## 2021-09-27 DIAGNOSIS — J309 Allergic rhinitis, unspecified: Secondary | ICD-10-CM | POA: Diagnosis not present

## 2021-09-27 DIAGNOSIS — R051 Acute cough: Secondary | ICD-10-CM | POA: Diagnosis not present

## 2021-09-27 DIAGNOSIS — B09 Unspecified viral infection characterized by skin and mucous membrane lesions: Secondary | ICD-10-CM | POA: Diagnosis not present

## 2021-10-03 DIAGNOSIS — R7989 Other specified abnormal findings of blood chemistry: Secondary | ICD-10-CM | POA: Diagnosis not present

## 2021-10-03 DIAGNOSIS — E785 Hyperlipidemia, unspecified: Secondary | ICD-10-CM | POA: Diagnosis not present

## 2021-10-05 DIAGNOSIS — Z0000 Encounter for general adult medical examination without abnormal findings: Secondary | ICD-10-CM | POA: Diagnosis not present

## 2021-10-05 DIAGNOSIS — E785 Hyperlipidemia, unspecified: Secondary | ICD-10-CM | POA: Diagnosis not present

## 2021-10-05 DIAGNOSIS — R82998 Other abnormal findings in urine: Secondary | ICD-10-CM | POA: Diagnosis not present

## 2021-10-05 DIAGNOSIS — I872 Venous insufficiency (chronic) (peripheral): Secondary | ICD-10-CM | POA: Diagnosis not present

## 2021-10-05 DIAGNOSIS — I341 Nonrheumatic mitral (valve) prolapse: Secondary | ICD-10-CM | POA: Diagnosis not present

## 2021-10-05 DIAGNOSIS — I272 Pulmonary hypertension, unspecified: Secondary | ICD-10-CM | POA: Diagnosis not present

## 2021-10-06 DIAGNOSIS — F4323 Adjustment disorder with mixed anxiety and depressed mood: Secondary | ICD-10-CM | POA: Diagnosis not present

## 2021-10-10 DIAGNOSIS — Z0000 Encounter for general adult medical examination without abnormal findings: Secondary | ICD-10-CM | POA: Diagnosis not present

## 2021-10-14 ENCOUNTER — Other Ambulatory Visit: Payer: Self-pay | Admitting: Cardiovascular Disease

## 2021-10-17 NOTE — Telephone Encounter (Signed)
Rx request sent to pharmacy.  

## 2021-10-19 DIAGNOSIS — D1801 Hemangioma of skin and subcutaneous tissue: Secondary | ICD-10-CM | POA: Diagnosis not present

## 2021-10-19 DIAGNOSIS — Z85828 Personal history of other malignant neoplasm of skin: Secondary | ICD-10-CM | POA: Diagnosis not present

## 2021-10-19 DIAGNOSIS — L814 Other melanin hyperpigmentation: Secondary | ICD-10-CM | POA: Diagnosis not present

## 2021-10-24 DIAGNOSIS — Z961 Presence of intraocular lens: Secondary | ICD-10-CM | POA: Diagnosis not present

## 2021-10-24 DIAGNOSIS — H02834 Dermatochalasis of left upper eyelid: Secondary | ICD-10-CM | POA: Diagnosis not present

## 2021-10-24 DIAGNOSIS — H04123 Dry eye syndrome of bilateral lacrimal glands: Secondary | ICD-10-CM | POA: Diagnosis not present

## 2021-10-24 DIAGNOSIS — H02831 Dermatochalasis of right upper eyelid: Secondary | ICD-10-CM | POA: Diagnosis not present

## 2021-10-27 IMAGING — US US ABDOMEN LIMITED
1 series · 13 of 25 positions shown · non-contrast
Comparison: Ultrasound 10/29/2018.

CLINICAL DATA: Follow-up gallbladder polyp.

EXAM:
ULTRASOUND ABDOMEN LIMITED RIGHT UPPER QUADRANT

[Series 1: us abdomen limited · 0.17mm/px · 13 of 51 slices shown]
[im 1/51]
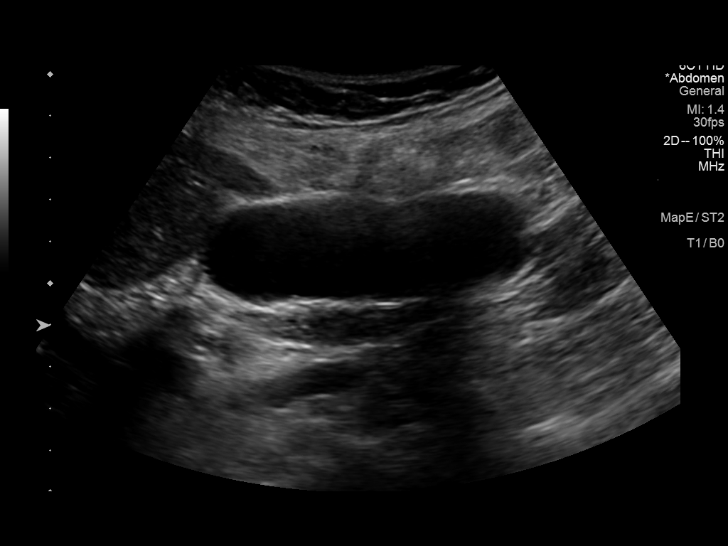
[im 5/51]
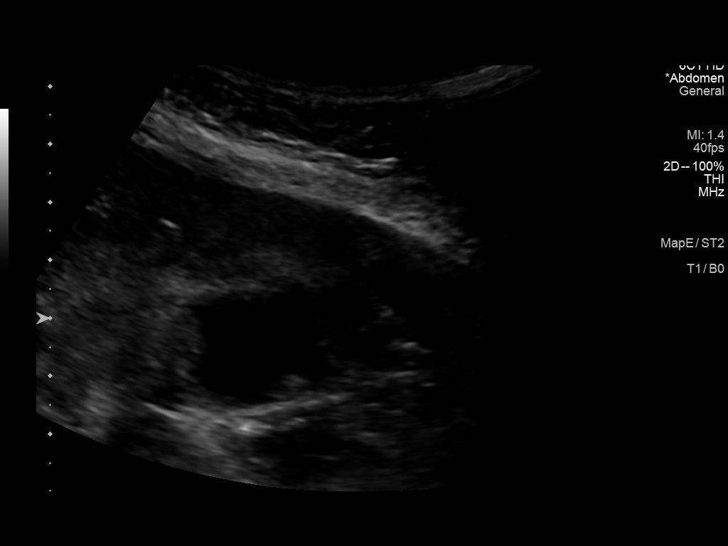
[im 9/51]
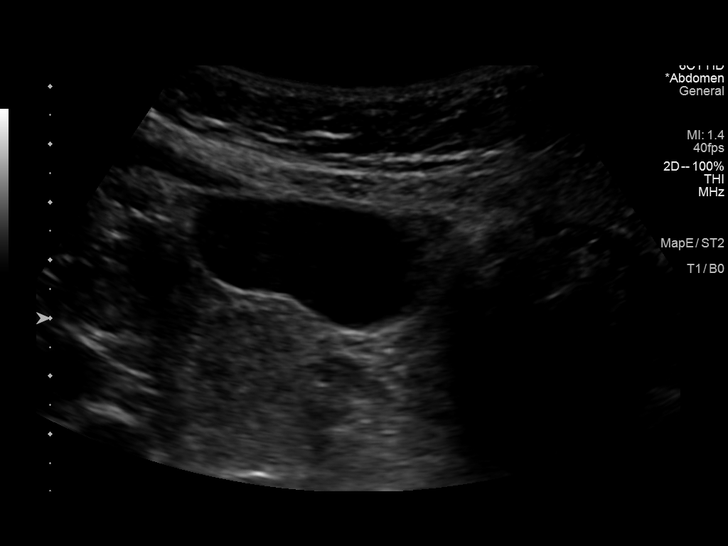
[im 13/51]
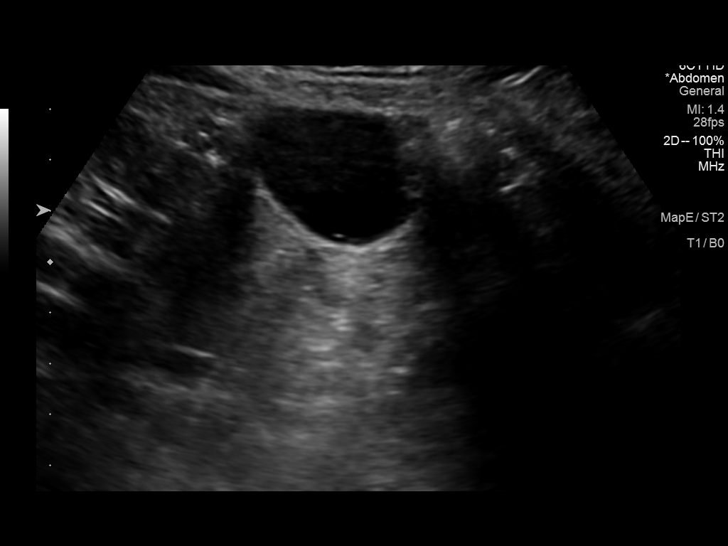
[im 17/51]
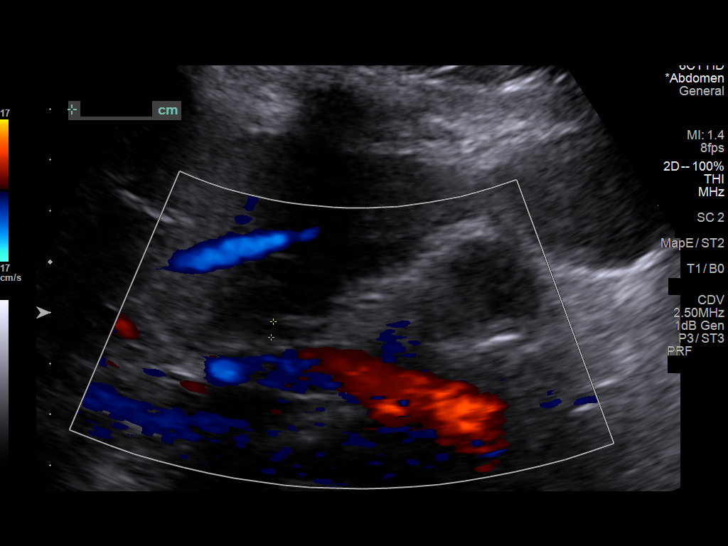
[im 21/51]
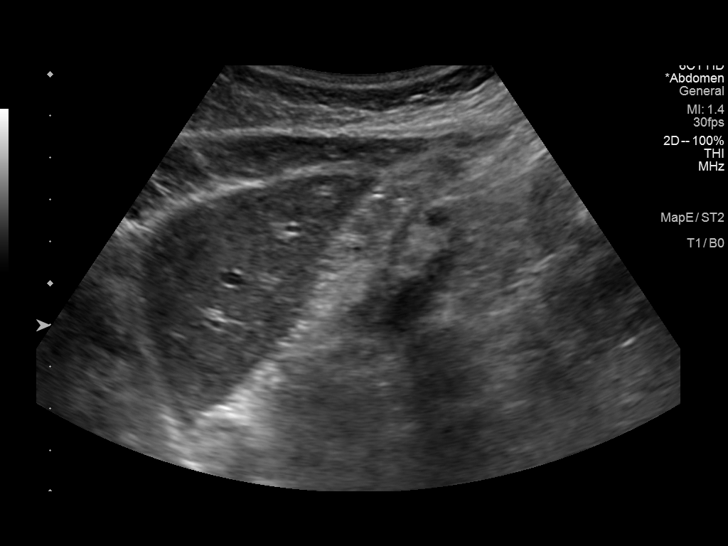
[im 26/51]
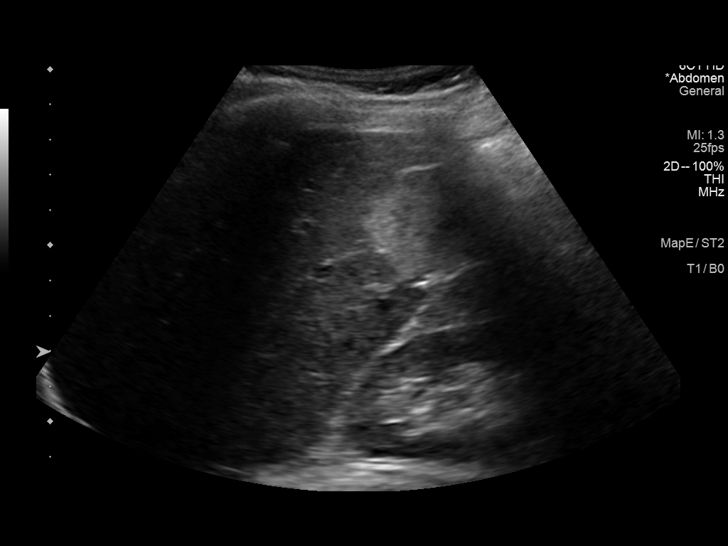
[im 30/51]
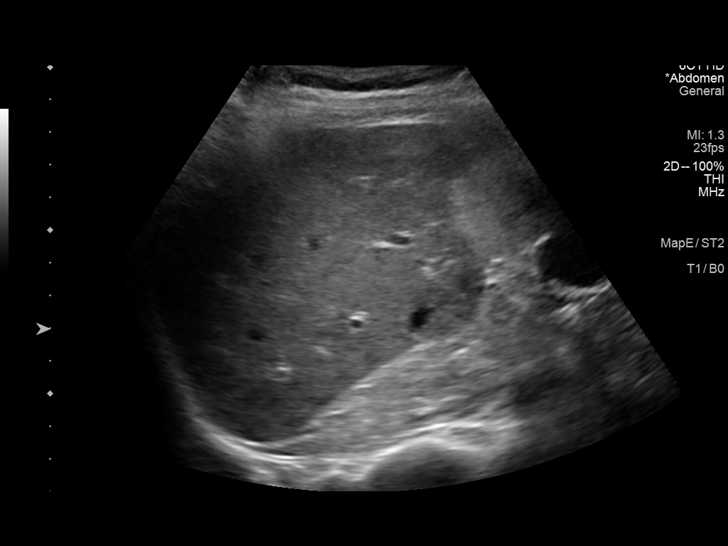
[im 34/51]
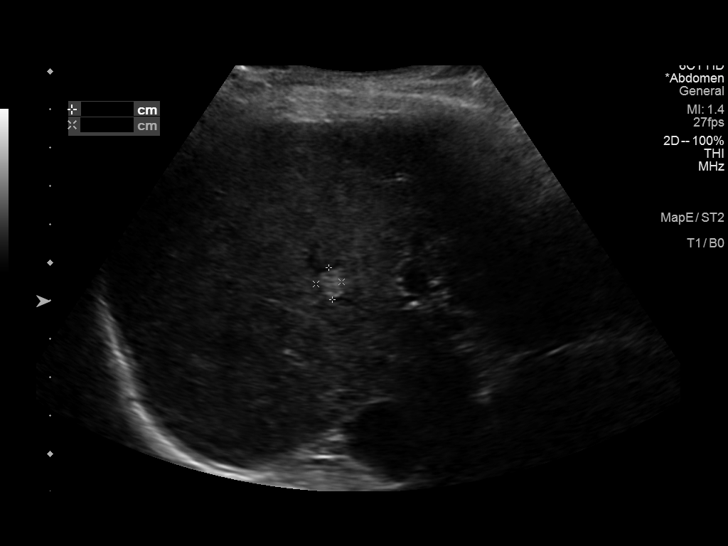
[im 38/51]
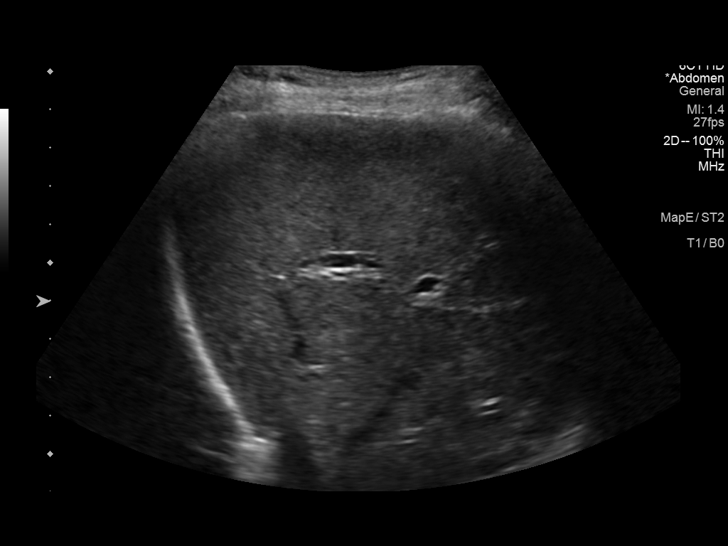
[im 42/51]
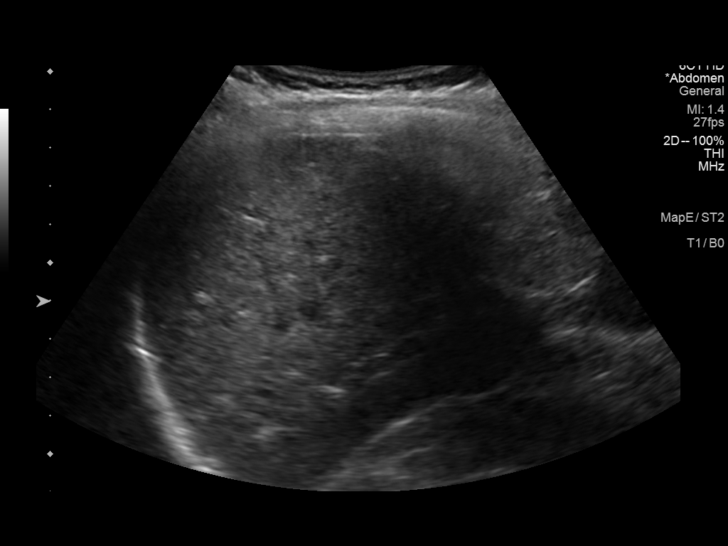
[im 46/51]
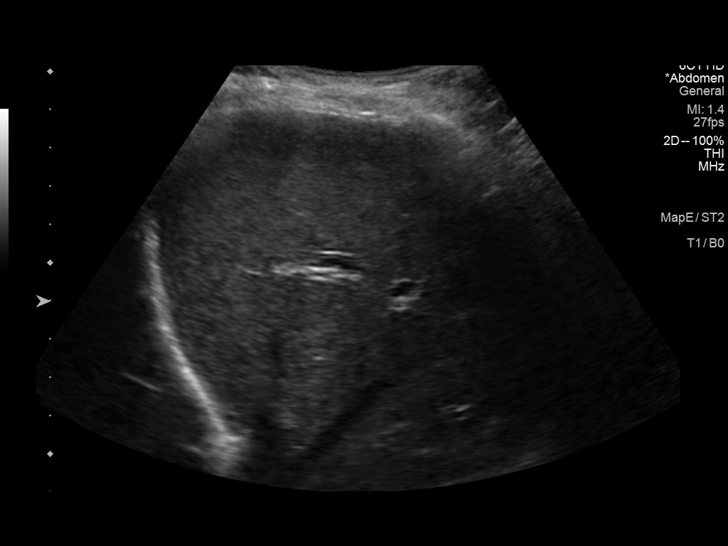
[im 51/51]
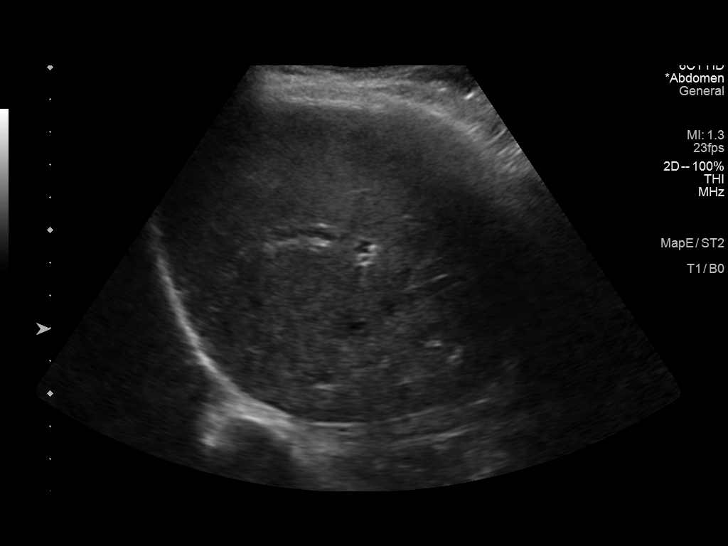

[13 of 25 positions shown; findings below may reference images not displayed]

FINDINGS: Gallbladder:

An 8 mm polyp is noted the gallbladder. This measures minimally
larger than on prior study of 10/29/2018 at which time it measured
7.2 mm. Continued close follow-up exam suggested. No gallstones or
gallbladder wall thickening. Negative Murphy sign.

Common bile duct:

Diameter: 3.1 mm

Liver:

Three hyperechoic masses are again noted the liver. The mass in the
left hepatic lobe measures 2.8 x 2.1 x 3.0 cm and is smaller on
today's exam. Largest right hepatic mass measures 3.9 x 3.4 x 3.4 cm
and appears similar to prior exam. Small right hepatic lobe mass
measures 0.9 x 0.8 x 0.7 cm and a smaller than on prior exam. These
are most consistent with benign hemangiomas. No new abnormalities
identified. Portal vein is patent on color Doppler imaging with
normal direction of blood flow towards the liver.

Other: None.
IMPRESSION: 1. An 8 mm polyp is noted the gallbladder. This measures minimally
larger than on prior study of 10/29/2018 which time it measures
mm. Continued close follow-up exam suggested. No gallstones or
biliary distention.

2. Three hyperechoic masses are again noted the liver, these are
stable to slightly smaller. These are most likely benign
hemangiomas.

## 2021-11-07 DIAGNOSIS — F4323 Adjustment disorder with mixed anxiety and depressed mood: Secondary | ICD-10-CM | POA: Diagnosis not present

## 2021-11-17 DIAGNOSIS — F4323 Adjustment disorder with mixed anxiety and depressed mood: Secondary | ICD-10-CM | POA: Diagnosis not present

## 2021-11-21 DIAGNOSIS — Z1231 Encounter for screening mammogram for malignant neoplasm of breast: Secondary | ICD-10-CM | POA: Diagnosis not present

## 2021-11-23 DIAGNOSIS — D2262 Melanocytic nevi of left upper limb, including shoulder: Secondary | ICD-10-CM | POA: Diagnosis not present

## 2021-11-23 DIAGNOSIS — Z85828 Personal history of other malignant neoplasm of skin: Secondary | ICD-10-CM | POA: Diagnosis not present

## 2021-11-23 DIAGNOSIS — D2261 Melanocytic nevi of right upper limb, including shoulder: Secondary | ICD-10-CM | POA: Diagnosis not present

## 2021-11-23 DIAGNOSIS — L821 Other seborrheic keratosis: Secondary | ICD-10-CM | POA: Diagnosis not present

## 2021-11-28 DIAGNOSIS — F4323 Adjustment disorder with mixed anxiety and depressed mood: Secondary | ICD-10-CM | POA: Diagnosis not present

## 2021-11-29 DIAGNOSIS — R921 Mammographic calcification found on diagnostic imaging of breast: Secondary | ICD-10-CM | POA: Diagnosis not present

## 2021-11-30 NOTE — Progress Notes (Unsigned)
Cardiology Office Note:    Date:  12/01/2021   ID:  Shannon Brennan, DOB 1945-06-01, MRN [id number]  PCP:  Prince Solian, MD   [facility] Providers Cardiologist:  Skeet Latch, MD     Referring MD: Prince Solian, MD   No chief complaint on file.  CC: Establish care  History of Present Illness:    Shannon Brennan is a 76 y.o. female with a hx of arthritis, diverticulosis, hepatic hemangioma, osteopenia, hyperlipidemia, hypertension, moderate pulmonary hypertension, and mitral valve prolapse, who presents today for follow-up. She was previously a patient of Dr. Mare Ferrari and she was followed by Truitt Merle, NP since that time. She has mitral valve prolapse with mild mitral regurgitation. She had an Echo in 2017 that was negative for ischemia. There was concern for pulmonary hypertension, and she was referred to pulmonary. However, on repeat echo in 2020, pulmonary pressures were normal.  She did still have moderate mitral valve prolapse but no significant mitral regurgitation.  At her last appointment she reported some occasional palpitations.  Overall they were well controlled on atenolol.  She was going to work on diet and exercise to better control her lipids.  She struggled with COVID 05/2021 and then multiple episodes of bronchitis.  She had a scare with a mammogram which was ultimately found to be non cancerous but she has beenunder a lot of stress.  Her daughter has DM and was admitted 11 days with sepsis.    Her BP in the AM has been 140s-150s before she takes her medication.  A few hours after taking her medication her BP comes down to the 90s-110s.    She struggles with getting back to sleep if she wakes up in the middle of the night.    She notes that her cholesterol was a little worse with her recent labs.  Her HDL was lower than usual and ApoB was mildly elevated.    At times she feels a little more short of breath going up steps but it seems to be better.  She  thinks it may be related to her upper respiratory infections.  She had one episode of chest pain when lying down but never with exertion.  She has mild LE edema but the end of the day that improves with elevation.  It tends to be worse on the right than the left  Past Medical History:  Diagnosis Date   Allergic rhinitis    Arthritis    mild in right thumb   Diverticulosis    Essential hypertension 12/01/2021   Family history of cardiovascular disease    Hepatic hemangioma    history of -- followe by Dr. Cristina Brennan   Hx of osteopenia    Hyperlipidemia    tolerates low dose Lipitor   Hypertension    controlled with atenolol   Insomnia    Mitral valve prolapse    last echo in 2005 showing mild MVP with normal LV function   Normal nuclear stress test 2006   Palpitations    Pyuria    UTI (urinary tract infection)    Vaginal atrophy     Past Surgical History:  Procedure Laterality Date   ABDOMINAL HYSTERECTOMY     LUMBAR LAMINECTOMY/DECOMPRESSION MICRODISCECTOMY  01/04/2012   Procedure: LUMBAR LAMINECTOMY/DECOMPRESSION MICRODISCECTOMY 1 LEVEL;  Surgeon: Kristeen Miss, MD;  Location: [facility];  Service: Neurosurgery;  Laterality: Left;  Left Lumbar Five-Sacral One Microdiskectomy   PARTIAL HYSTERECTOMY     TONSILLECTOMY  Current Medications: Current Meds  Medication Sig   atorvastatin (LIPITOR) 10 MG tablet 0.5 mg by mouth 5 times a week   carvedilol (COREG) 3.125 MG tablet Take 1 tablet (3.125 mg total) by mouth 2 (two) times daily.   cetirizine (ZYRTEC) 10 MG tablet Take 1 tablet by mouth daily.   furosemide (LASIX) 20 MG tablet Take 20 mg by mouth daily as needed.   Nutritional Supplements (MELATONIN PO) Take 10 mg by mouth at bedtime.   Probiotic Product (ALIGN PO) Take by mouth in the morning and at bedtime. 2 tabs   RESTASIS 0.05 % ophthalmic emulsion 1 drop 2 (two) times daily.   traZODone (DESYREL) 50 MG tablet Take 100 mg by mouth daily.   [DISCONTINUED] atenolol  (TENORMIN) 25 MG tablet TAKE 1/2 TABLET(12.5 MG) BY MOUTH DAILY   [DISCONTINUED] atorvastatin (LIPITOR) 10 MG tablet Take 0.5 tablets (5 mg total) by mouth 3 (three) times a week. (Patient taking differently: 0.5 mg by mouth 5 times a week)     Allergies:   Erythromycin, Flagyl [metronidazole hcl], Levofloxacin, Wellbutrin [bupropion hcl], Zoloft [sertraline hcl], and Restoril   Social History   Socioeconomic History   Marital status: Married    Spouse name: Not on file   Number of children: Not on file   Years of education: Not on file   Highest education level: Not on file  Occupational History   Not on file  Tobacco Use   Smoking status: Never   Smokeless tobacco: Never  Vaping Use   Vaping Use: Never used  Substance and Sexual Activity   Alcohol use: No   Drug use: No   Sexual activity: Never    Birth control/protection: Surgical  Other Topics Concern   Not on file  Social History Narrative   Not on file   Social Determinants of Health   Financial Resource Strain: Low Risk  (10/21/2020)   Overall Financial Resource Strain (CARDIA)    Difficulty of Paying Living Expenses: Not hard at all  Food Insecurity: No Food Insecurity (10/21/2020)   Hunger Vital Sign    Worried About Running Out of Food in the Last Year: Never true    [place] in the Last Year: Never true  Transportation Needs: No Transportation Needs (10/21/2020)   PRAPARE - Hydrologist (Medical): No    Lack of Transportation (Non-Medical): No  Physical Activity: Sufficiently Active (10/21/2020)   Exercise Vital Sign    Days of Exercise per Week: 5 days    Minutes of Exercise per Session: 30 min  Stress: Stress Concern Present (10/21/2020)   [place]    Feeling of Stress : To some extent  Social Connections: Not on file     Family History: The patient's family history includes Arthritis in her father and  mother; Cancer in her father and mother; Coronary artery disease in her brother; Diabetes in her brother; Heart attack in her brother; Hyperlipidemia in her mother; Hypertension in her brother, brother, and mother; Multiple sclerosis in her brother; Osteopenia in her mother; Stroke in her mother; Ulcers in her mother.  ROS:   Please see the history of present illness.    (+) Shortness of breath (+) Mild palpitations (+) Bilateral LE "jumpiness" (+) Bilateral LE edema All other systems reviewed and are negative.  EKGs/Labs/Other Studies Reviewed:    The following studies were reviewed today:  Echo 11/15/2018: 1. The left  ventricle has normal systolic function with an ejection  fraction of 60-65%. The cavity size was normal. Left ventricular diastolic  Doppler parameters are consistent with impaired relaxation. No evidence of  left ventricular regional wall  motion abnormalities.   2. The right ventricle has normal systolic function. The cavity was  normal. There is no increase in right ventricular wall thickness.   3. Moderate mitral valve prolapse.   4. The mitral valve is abnormal. Mild thickening of the mitral valve  leaflet. The MR jet is posteriorly-directed.   5. The tricuspid valve is grossly normal.   6. The aortic valve is tricuspid. No stenosis of the aortic valve.   7. The ascending aorta and aortic root are normal in size and structure.   8. When compared to the prior study: 10/08/17 EF 55-60. Trivial MR.   Echo 10/08/2017: - Left ventricle: The cavity size was normal. There was mild focal    basal hypertrophy of the septum. Systolic function was normal.    The estimated ejection fraction was in the range of 55% to 60%.    Wall motion was normal; there were no regional wall motion    abnormalities. Doppler parameters are consistent with abnormal    left ventricular relaxation (grade 1 diastolic dysfunction).  - Aortic valve: There was trivial regurgitation.  - Mitral  valve: Mild prolapse, involving the anterior leaflet and    the posterior leaflet.  - Left atrium: The atrium was mildly dilated.   Echo 06/12/2017: - Left ventricle: The cavity size was normal. Systolic function was    normal. The estimated ejection fraction was in the range of 55%    to 60%. Wall motion was normal; there were no regional wall    motion abnormalities. Features are consistent with a pseudonormal    left ventricular filling pattern, with concomitant abnormal    relaxation and increased filling pressure (grade 2 diastolic    dysfunction). Doppler parameters are consistent with high    ventricular filling pressure.  - Mitral valve: Mild, late systolicprolapse, involving the anterior    leaflet and the posterior leaflet. There was trivial    regurgitation.  - Left atrium: The atrium was mildly dilated.  - Right atrium: The atrium was mildly dilated.  - Pulmonary arteries: PA peak pressure: 48 mm Hg (S).   Impressions:  - The right ventricular systolic pressure was increased consistent    with moderate pulmonary hypertension.   ETT 12/28/2015: Negative GXT The patient walked for 6 minutes on a Bruce protocol GXT. The peak H R was 149 which is 97% predicted maximal HR . There were no ST or T wave changes to suggest ischemia. Normal BP response   The patient walked for a total of 6 minutes on a Bruce protocol GXT.   The patient achieved a peak HR of 149 which is 97%  predicted maximal HR.   There were no ST changes to suggest ischemia.   The BP response to exercise was normal.  EKG:   10/21/2020: Sinus rhythm. Rate 66 bpm. 12/01/21: Sinus rhythm.  Rate 62 bpm.  First degree AV block.  Recent Labs: No results found for requested labs within last 365 days.  Recent Lipid Panel    Component Value Date/Time   CHOL 187 05/13/2019 0943   TRIG 94 05/13/2019 0943   HDL 72 05/13/2019 0943   CHOLHDL 2.6 05/13/2019 0943   CHOLHDL 2.3 07/14/2015 0917   VLDL 18 07/14/2015 0917    LDLCALC  98 05/13/2019 0943   LDLDIRECT 133.0 02/05/2013 1512    Physical Exam:    VS:  BP 124/76   Pulse 62   Ht '5\' 3"'$  (1.6 m)   Wt 126 lb 11.2 oz (57.5 kg)   BMI 22.44 kg/m  , BMI Body mass index is 22.44 kg/m. GENERAL:  Well appearing HEENT: Pupils equal round and reactive, fundi not visualized, oral mucosa unremarkable NECK:  No jugular venous distention, waveform within normal limits, carotid upstroke brisk and symmetric, no bruits LUNGS:  Clear to auscultation bilaterally HEART:  RRR.  PMI not displaced or sustained,S1 and S2 within normal limits, no S3, no S4, no clicks, no rubs, mid systolic click.  II/VI systolic murmurs ABD:  Flat, positive bowel sounds normal in frequency in pitch, no bruits, no rebound, no guarding, no midline pulsatile mass, no hepatomegaly, no splenomegaly EXT:  2 plus pulses throughout, no edema, no cyanosis no clubbing SKIN:  No rashes no nodules NEURO:  Cranial nerves II through XII grossly intact, motor grossly intact throughout PSYCH:  Cognitively intact, oriented to person place and time   ASSESSMENT:    1. Hypercholesterolemia   2. Mitral valve prolapse   3. Essential hypertension     PLAN:    Mitral valve prolapse She has moderate mitral valve prolapse on echo.  Overall she has been doing well but does have some mildly increased shortness of breath.  This may be due to her upper respiratory infections.  We will repeat her echo to make sure there is been no change in her mitral valve.  Hypercholesterolemia Continue atorvastatin.  The dose was increased by her PCP.  She will come back for fasting lipids and a CMP around January.  We also discussed the importance of increasing her exercise.  Her goal is at least 150 minutes weekly.  Essential hypertension Blood pressure was initially elevated but better on repeat.  At home she notes that it is elevated in the morning when she gets up and then improves after taking her medication.  It seems  that the atenolol is not lasting the entire 24 hours.  After she does take her medicine her blood pressure drops pretty significantly so I am concerned about the increasing the dose.  We will have her try carvedilol 3.125 mg twice daily.  She will keep checking her blood pressures and bring to follow-up.    Disposition: Follow-up with Rakel Junio C. Oval Linsey, MD, [facility] in 1 year.   Medication Adjustments/Labs and Tests Ordered: Current medicines are reviewed at length with the patient today.  Concerns regarding medicines are outlined above.  Orders Placed This Encounter  Procedures   Comprehensive metabolic panel   Lipid panel   EKG 12-Lead   ECHOCARDIOGRAM COMPLETE    Meds ordered this encounter  Medications   carvedilol (COREG) 3.125 MG tablet    Sig: Take 1 tablet (3.125 mg total) by mouth 2 (two) times daily.    Dispense:  180 tablet    Refill:  3    D/C ATENOLOL     Patient Instructions  Medication Instructions:  STOP ATENOLOL   START CARVEDILOL 3.125 MG TWICE A DAY   *If you need a refill on your cardiac medications before your next appointment, please call your pharmacy*  Lab Work: FASTING LP/CMET IN [place] 2024  If you have labs (blood work) drawn today and your tests are completely normal, you will receive your results only by: [place] (if you have MyChart) OR A paper  copy in the mail If you have any lab test that is abnormal or we need to change your treatment, we will call you to review the results.  Testing/Procedures: Your physician has requested that you have an echocardiogram. Echocardiography is a painless test that uses sound waves to create images of your heart. It provides your doctor with information about the size and shape of your heart and how well your heart's chambers and valves are working. This procedure takes approximately one hour. There are no restrictions for this procedure.  Follow-Up: At [facility], you and your health needs are  our priority.  As part of our continuing mission to provide you with exceptional heart care, we have created designated Provider Care Teams.  These Care Teams include your primary Cardiologist (physician) and Advanced Practice Providers (APPs -  Physician Assistants and Nurse Practitioners) who all work together to provide you with the care you need, when you need it.  We recommend signing up for the patient portal called "MyChart".  Sign up information is provided on this After Visit Summary.  MyChart is used to connect with patients for Virtual Visits (Telemedicine).  Patients are able to view lab/test results, encounter notes, upcoming appointments, etc.  Non-urgent messages can be sent to your provider as well.   To learn more about what you can do with MyChart, go to [email or web address].    Your next appointment:   4 week(s)  The format for your next appointment:   In Person  Provider:   Laurann Montana, NP{  DR [place], Skeet Latch, MD  12/01/2021 8:50 AM    [place]

## 2021-12-01 ENCOUNTER — Ambulatory Visit (HOSPITAL_BASED_OUTPATIENT_CLINIC_OR_DEPARTMENT_OTHER): Payer: Medicare Other | Admitting: Cardiovascular Disease

## 2021-12-01 ENCOUNTER — Encounter (HOSPITAL_BASED_OUTPATIENT_CLINIC_OR_DEPARTMENT_OTHER): Payer: Self-pay

## 2021-12-01 ENCOUNTER — Encounter (HOSPITAL_BASED_OUTPATIENT_CLINIC_OR_DEPARTMENT_OTHER): Payer: Self-pay | Admitting: Cardiovascular Disease

## 2021-12-01 VITALS — BP 124/76 | HR 62 | Ht 63.0 in | Wt 126.7 lb

## 2021-12-01 DIAGNOSIS — E7800 Pure hypercholesterolemia, unspecified: Secondary | ICD-10-CM

## 2021-12-01 DIAGNOSIS — I10 Essential (primary) hypertension: Secondary | ICD-10-CM | POA: Diagnosis not present

## 2021-12-01 DIAGNOSIS — I341 Nonrheumatic mitral (valve) prolapse: Secondary | ICD-10-CM | POA: Diagnosis not present

## 2021-12-01 HISTORY — DX: Essential (primary) hypertension: I10

## 2021-12-01 MED ORDER — CARVEDILOL 3.125 MG PO TABS: 3.1250 mg | ORAL_TABLET | Freq: Two times a day (BID) | ORAL | 3 refills | Status: DC

## 2021-12-01 NOTE — Assessment & Plan Note (Signed)
She has moderate mitral valve prolapse on echo.  Overall she has been doing well but does have some mildly increased shortness of breath.  This may be due to her upper respiratory infections.  We will repeat her echo to make sure there is been no change in her mitral valve.

## 2021-12-01 NOTE — Assessment & Plan Note (Signed)
Blood pressure was initially elevated but better on repeat.  At home she notes that it is elevated in the morning when she gets up and then improves after taking her medication.  It seems that the atenolol is not lasting the entire 24 hours.  After she does take her medicine her blood pressure drops pretty significantly so I am concerned about the increasing the dose.  We will have her try carvedilol 3.125 mg twice daily.  She will keep checking her blood pressures and bring to follow-up.

## 2021-12-01 NOTE — Assessment & Plan Note (Signed)
Continue atorvastatin.  The dose was increased by her PCP.  She will come back for fasting lipids and a CMP around January.  We also discussed the importance of increasing her exercise.  Her goal is at least 150 minutes weekly.

## 2021-12-01 NOTE — Patient Instructions (Signed)
Medication Instructions:  STOP ATENOLOL   START CARVEDILOL 3.125 MG TWICE A DAY   *If you need a refill on your cardiac medications before your next appointment, please call your pharmacy*  Lab Work: FASTING LP/CMET IN [place] 2024  If you have labs (blood work) drawn today and your tests are completely normal, you will receive your results only by: [place] (if you have MyChart) OR A paper copy in the mail If you have any lab test that is abnormal or we need to change your treatment, we will call you to review the results.  Testing/Procedures: Your physician has requested that you have an echocardiogram. Echocardiography is a painless test that uses sound waves to create images of your heart. It provides your doctor with information about the size and shape of your heart and how well your heart's chambers and valves are working. This procedure takes approximately one hour. There are no restrictions for this procedure.  Follow-Up: At [facility], you and your health needs are our priority.  As part of our continuing mission to provide you with exceptional heart care, we have created designated Provider Care Teams.  These Care Teams include your primary Cardiologist (physician) and Advanced Practice Providers (APPs -  Physician Assistants and Nurse Practitioners) who all work together to provide you with the care you need, when you need it.  We recommend signing up for the patient portal called "MyChart".  Sign up information is provided on this After Visit Summary.  MyChart is used to connect with patients for Virtual Visits (Telemedicine).  Patients are able to view lab/test results, encounter notes, upcoming appointments, etc.  Non-urgent messages can be sent to your provider as well.   To learn more about what you can do with MyChart, go to [email or web address].    Your next appointment:   4 week(s)  The format for your next appointment:   In Person  Provider:   Laurann Montana, NP{  DR [facility] IN 1 YEAR

## 2021-12-06 ENCOUNTER — Telehealth (HOSPITAL_BASED_OUTPATIENT_CLINIC_OR_DEPARTMENT_OTHER): Payer: Self-pay | Admitting: *Deleted

## 2021-12-06 ENCOUNTER — Ambulatory Visit (INDEPENDENT_AMBULATORY_CARE_PROVIDER_SITE_OTHER): Payer: Medicare Other

## 2021-12-06 ENCOUNTER — Encounter (HOSPITAL_BASED_OUTPATIENT_CLINIC_OR_DEPARTMENT_OTHER): Payer: Self-pay | Admitting: Cardiovascular Disease

## 2021-12-06 DIAGNOSIS — I341 Nonrheumatic mitral (valve) prolapse: Secondary | ICD-10-CM

## 2021-12-06 LAB — ECHOCARDIOGRAM COMPLETE
Area-P 1/2: 3.72 cm2
MV M vel: 5.86 m/s
MV Peak grad: 137.4 mmHg
S' Lateral: 2.71 cm

## 2021-12-06 NOTE — Telephone Encounter (Signed)
When patient was in office earlier today for Echo she had asked that I call her about how she had been feeling She sent in mychart message however since then she noticed her HR was elevated SBP 110's HR 100-105 even after resting  She finally laid down for 40 minutes before came down to 78 Patient did get message from Overton Mam NP via [place] but just wanted for her to be aware of this All this started since stopping Atenolol and starting Carvedilol  Does not feel as well with the new medication as she did with Atenolol but is still willing to continue   Will forward to Overton Mam NP for review

## 2021-12-07 NOTE — Telephone Encounter (Signed)
Spoke with patient and highest HR today 85  She will continue current medications

## 2021-12-07 NOTE — Telephone Encounter (Signed)
Can we call to touch base on how her heart rate and blood pressure are doing?  If heart rate persistently more than 100bpm at rest could trial going back to previous Atenolol and keeping a log of blood pressure. Her BP was running a bit high previously on Atenolol.   If heart rate only intermittently increasing, ensure taking doses of Carvedilol 12 hours apart and continue to monitor for a few more days.   Loel Dubonnet, NP

## 2021-12-15 DIAGNOSIS — F4323 Adjustment disorder with mixed anxiety and depressed mood: Secondary | ICD-10-CM | POA: Diagnosis not present

## 2021-12-21 NOTE — Telephone Encounter (Signed)
Please advise 

## 2021-12-21 NOTE — Telephone Encounter (Signed)
Pt. Folllowing up with you

## 2021-12-29 DIAGNOSIS — F4323 Adjustment disorder with mixed anxiety and depressed mood: Secondary | ICD-10-CM | POA: Diagnosis not present

## 2021-12-30 ENCOUNTER — Ambulatory Visit (HOSPITAL_BASED_OUTPATIENT_CLINIC_OR_DEPARTMENT_OTHER): Payer: Medicare Other | Admitting: Family

## 2021-12-30 ENCOUNTER — Encounter (HOSPITAL_BASED_OUTPATIENT_CLINIC_OR_DEPARTMENT_OTHER): Payer: Self-pay | Admitting: Family

## 2021-12-30 VITALS — BP 113/63 | HR 80 | Ht 63.0 in | Wt 128.0 lb

## 2021-12-30 DIAGNOSIS — E782 Mixed hyperlipidemia: Secondary | ICD-10-CM

## 2021-12-30 DIAGNOSIS — R072 Precordial pain: Secondary | ICD-10-CM

## 2021-12-30 DIAGNOSIS — I341 Nonrheumatic mitral (valve) prolapse: Secondary | ICD-10-CM | POA: Diagnosis not present

## 2021-12-30 DIAGNOSIS — N390 Urinary tract infection, site not specified: Secondary | ICD-10-CM | POA: Diagnosis not present

## 2021-12-30 DIAGNOSIS — I10 Essential (primary) hypertension: Secondary | ICD-10-CM | POA: Diagnosis not present

## 2021-12-30 DIAGNOSIS — Z8249 Family history of ischemic heart disease and other diseases of the circulatory system: Secondary | ICD-10-CM

## 2021-12-30 LAB — BASIC METABOLIC PANEL
BUN/Creatinine Ratio: 20 (ref 12–28)
BUN: 18 mg/dL (ref 8–27)
CO2: 25 mmol/L (ref 20–29)
Calcium: 9.8 mg/dL (ref 8.7–10.3)
Chloride: 102 mmol/L (ref 96–106)
Creatinine, Ser: 0.88 mg/dL (ref 0.57–1.00)
Glucose: 113 mg/dL — ABNORMAL HIGH (ref 70–99)
Potassium: 4.8 mmol/L (ref 3.5–5.2)
Sodium: 143 mmol/L (ref 134–144)
eGFR: 68 mL/min/{1.73_m2} (ref >59)

## 2021-12-30 MED ORDER — AMLODIPINE BESYLATE 5 MG PO TABS: 5.0000 mg | ORAL_TABLET | Freq: Every day | ORAL | 3 refills | Status: DC

## 2021-12-30 MED ORDER — ATENOLOL 25 MG PO TABS: 12.5000 mg | ORAL_TABLET | Freq: Every day | ORAL | 3 refills | Status: DC

## 2021-12-30 MED ORDER — ATORVASTATIN CALCIUM 10 MG PO TABS: 5.0000 mg | ORAL_TABLET | Freq: Every day | ORAL | 3 refills | Status: DC

## 2021-12-30 NOTE — Progress Notes (Signed)
Office Visit    Patient Name: Shannon Brennan Date of Encounter: 12/30/2021  PCP:  Prince Solian, [facility]  Cardiologist:  Skeet Latch, MD  Advanced Practice Provider:  No care team member to display Electrophysiologist:  None      Chief Complaint    Shannon Brennan is a 76 y.o. Brennan presents today for blood pressure follow up   Past Medical History    Past Medical History:  Diagnosis Date   Allergic rhinitis    Arthritis    mild in right thumb   Diverticulosis    Essential hypertension 12/01/2021   Family history of cardiovascular disease    Hepatic hemangioma    history of -- followe by Dr. Cristina Gong   Hx of osteopenia    Hyperlipidemia    tolerates low dose Lipitor   Hypertension    controlled with atenolol   Insomnia    Mitral valve prolapse    last echo in 2005 showing mild MVP with normal LV function   Normal nuclear stress test 2006   Palpitations    Pyuria    UTI (urinary tract infection)    Vaginal atrophy    Past Surgical History:  Procedure Laterality Date   ABDOMINAL HYSTERECTOMY     LUMBAR LAMINECTOMY/DECOMPRESSION MICRODISCECTOMY  01/04/2012   Procedure: LUMBAR LAMINECTOMY/DECOMPRESSION MICRODISCECTOMY 1 LEVEL;  Surgeon: Kristeen Miss, MD;  Location: [facility];  Service: Neurosurgery;  Laterality: Left;  Left Lumbar Five-Sacral One Microdiskectomy   PARTIAL HYSTERECTOMY     TONSILLECTOMY      Allergies  Allergies  Allergen Reactions   Erythromycin Diarrhea   Flagyl [Metronidazole Hcl] Nausea Only   Levofloxacin Other (See Comments)    Headache   Wellbutrin [Bupropion Hcl] Other (See Comments)    Dizziness   Zoloft [Sertraline Hcl]     Jittery    Restoril Palpitations    History of Present Illness    Shannon Brennan with a hx of arthritis, diverticulosis, hepatic hemangioma, osteopenia, HTN, HLD, moderate PAH, MVP last seen 12/01/21 by Dr. Oval Linsey.  Last seen 11/30/2021.  SBP not  maintaining at goal of less than 130 over 80 over 24 hours.  Her atenolol was transitioned to carvedilol.  Presents today for follow-up independently. Since transition to Carvedilol notes BP at home 140s in the morning then 130s.  Checking her blood pressure multiple times per day. Does try to rest 5-10 minutes prior to checking. Taking her Carvedilol 12 hours apart. SBP as low as 90s just 3 times associated with some lightheadedness. She endorses four 16 oz glasses of water and eats three meals per day.  Does not feel that carvedilol is helping control her heart rate.  Does endorse high anxiety level due to recent stressors.  Not presently on anxiety medication.  She has multiple family members with coronary disease and worries about requiring stent.  EKGs/Labs/Other Studies Reviewed:   The following studies were reviewed today: Echo 11/15/2018: 1. The left ventricle has normal systolic function with an ejection  fraction of 60-65%. The cavity size was normal. Left ventricular diastolic  Doppler parameters are consistent with impaired relaxation. No evidence of  left ventricular regional wall  motion abnormalities.   2. The right ventricle has normal systolic function. The cavity was  normal. There is no increase in right ventricular wall thickness.   3. Moderate mitral valve prolapse.   4. The mitral valve is abnormal.  Mild thickening of the mitral valve  leaflet. The MR jet is posteriorly-directed.   5. The tricuspid valve is grossly normal.   6. The aortic valve is tricuspid. No stenosis of the aortic valve.   7. The ascending aorta and aortic root are normal in size and structure.   8. When compared to the prior study: 10/08/17 EF 55-60. Trivial MR.    Echo 10/08/2017: - Left ventricle: The cavity size was normal. There was mild focal    basal hypertrophy of the septum. Systolic function was normal.    The estimated ejection fraction was in the range of 55% to 60%.    Wall motion was  normal; there were no regional wall motion    abnormalities. Doppler parameters are consistent with abnormal    left ventricular relaxation (grade 1 diastolic dysfunction).  - Aortic valve: There was trivial regurgitation.  - Mitral valve: Mild prolapse, involving the anterior leaflet and    the posterior leaflet.  - Left atrium: The atrium was mildly dilated.    Echo 06/12/2017: - Left ventricle: The cavity size was normal. Systolic function was    normal. The estimated ejection fraction was in the range of 55%    to 60%. Wall motion was normal; there were no regional wall    motion abnormalities. Features are consistent with a pseudonormal    left ventricular filling pattern, with concomitant abnormal    relaxation and increased filling pressure (grade 2 diastolic    dysfunction). Doppler parameters are consistent with high    ventricular filling pressure.  - Mitral valve: Mild, late systolicprolapse, involving the anterior    leaflet and the posterior leaflet. There was trivial    regurgitation.  - Left atrium: The atrium was mildly dilated.  - Right atrium: The atrium was mildly dilated.  - Pulmonary arteries: PA peak pressure: 48 mm Hg (S).   Impressions:  - The right ventricular systolic pressure was increased consistent    with moderate pulmonary hypertension.    ETT 12/28/2015: Negative GXT The patient walked for 6 minutes on a Bruce protocol GXT. The peak H R was 149 which is 97% predicted maximal HR . There were no ST or T wave changes to suggest ischemia. Normal BP response   The patient walked for a total of 6 minutes on a Bruce protocol GXT.   The patient achieved a peak HR of 149 which is 97%  predicted maximal HR.   There were no ST changes to suggest ischemia.   The BP response to exercise was normal.    EKG:  No EKG today.   Recent Labs: No results found for requested labs within last 365 days.  Recent Lipid Panel    Component Value Date/Time   CHOL 187  05/13/2019 0943   TRIG 94 05/13/2019 0943   HDL 72 05/13/2019 0943   CHOLHDL 2.6 05/13/2019 0943   CHOLHDL 2.3 07/14/2015 0917   VLDL 18 07/14/2015 0917   LDLCALC 98 05/13/2019 0943   LDLDIRECT 133.0 02/05/2013 1512   Home Medications   Current Meds  Medication Sig   amLODipine (NORVASC) 5 MG tablet Take 1 tablet (5 mg total) by mouth daily.   atenolol (TENORMIN) 25 MG tablet Take 0.5 tablets (12.5 mg total) by mouth daily.   cetirizine (ZYRTEC) 10 MG tablet Take 1 tablet by mouth daily as needed.   furosemide (LASIX) 20 MG tablet Take 20 mg by mouth daily as needed.   Nutritional Supplements (MELATONIN PO)  Take 5 mg by mouth at bedtime.   Probiotic Product (ALIGN PO) Take by mouth in the morning and at bedtime. 2 tabs   RESTASIS 0.05 % ophthalmic emulsion 1 drop 2 (two) times daily.   traZODone (DESYREL) 50 MG tablet Take 100 mg by mouth daily.   [DISCONTINUED] atorvastatin (LIPITOR) 10 MG tablet 0.5 mg by mouth 5 times a week   [DISCONTINUED] carvedilol (COREG) 3.125 MG tablet Take 1 tablet (3.125 mg total) by mouth 2 (two) times daily.     Review of Systems      All other systems reviewed and are otherwise negative except as noted above.  Physical Exam    VS:  BP (!) 142/80   Pulse 80   Ht '5\' 3"'$  (1.6 m)   Wt 128 lb (58.1 kg)   BMI 22.67 kg/m  , BMI Body mass index is 22.67 kg/m.  Wt Readings from Last 3 Encounters:  12/30/21 128 lb (58.1 kg)  12/01/21 126 lb 11.2 oz (57.5 kg)  10/21/20 133 lb 3.2 oz (60.4 kg)     GEN: Well nourished, well developed, in no acute distress. HEENT: normal. Neck: Supple, no JVD, carotid bruits, or masses. Cardiac: RRR, no murmurs, rubs, or gallops. No clubbing, cyanosis, edema.  Radials/PT 2+ and equal bilaterally.  Respiratory:  Respirations regular and unlabored, clear to auscultation bilaterally. GI: Soft, nontender, nondistended. MS: No deformity or atrophy. Skin: Warm and dry, no rash. Neuro:  Strength and sensation are  intact. Psych: Normal affect.  Assessment & Plan    MVP - Echo 12/06/21 mild MVP. Repeat echo in 1 year for monitoring. Continue optimal BP, volume control.   HLD - Continue Atorvastatin. Denies myalgias.   HTN -BP not at goal of less than 130/80.  BP labile at home as low as 90s over 60s as high as 140s over 90s.  She does not feel she is tolerating carvedilol well.  We will discontinue and return to atenolol 12.5 mg daily.  Add amlodipine 5 mg daily for BP control. Discussed to monitor BP at home at least 2 hours after medications and sitting for 5-10 minutes.   Family history of CAD - Endorses understandable anxiety regarding family history of heart disease. Notes some exertional dyspnea which could be anginal or related to deconditioning. Cardiac CTA for ischemic eval. BMP today. As returning to Atenolol and HR in the 60s when on this medication previously will defer additional beta blocker prior.     Disposition: Follow up in 2 month(s) with Skeet Latch, MD or APP.  Signed, Loel Dubonnet, NP 12/30/2021, 1:24 PM [facility]

## 2021-12-30 NOTE — Patient Instructions (Addendum)
Medication Instructions:  Your physician has recommended you make the following change in your medication:   Stop: Carvedilol  Start: Atenolol 12.'5mg'$   HALF tablet daily   Start: Amlodipine '5mg'$  daily   We have refilled your atorvastatin today!   *If you need a refill on your cardiac medications before your next appointment, please call your pharmacy*   Lab Work: Your physician recommends that you return for lab work today- BMP   If you have labs (blood work) drawn today and your tests are completely normal, you will receive your results only by: MyChart Message (if you have MyChart) OR A paper copy in the mail If you have any lab test that is abnormal or we need to change your treatment, we will call you to review the results.   Testing/Procedures:   Your cardiac CT will be scheduled at one of the below locations:   [facility] [address] [place], [place] [address] [phone]  If scheduled at [facility], please arrive at the [facility] and Children's Entrance (Entrance C2) of [facility] 30 minutes prior to test start time. You can use the FREE valet parking offered at entrance C (encouraged to control the heart rate for the test)  Proceed to the [facility] Radiology Department (first floor) to check-in and test prep.  All radiology patients and guests should use entrance C2 at [facility], accessed from [facility], even though the hospital's physical address listed is [address].     Please follow these instructions carefully (unless otherwise directed):  On the Night Before the Test: Be sure to Drink plenty of water. Do not consume any caffeinated/decaffeinated beverages or chocolate 12 hours prior to your test. Do not take any antihistamines 12 hours prior to your test.  On the Day of the Test: Drink plenty of water until 1 hour prior to the test. Do not eat any food 4 hours prior to the test. You  may take your regular medications prior to the test. MAKE SURE TO TAKE ATENOLOL.  HOLD Furosemide morning of the test. FEMALES- please wear underwire-free bra if available, avoid dresses & tight clothing      After the Test: Drink plenty of water. After receiving IV contrast, you may experience a mild flushed feeling. This is normal. On occasion, you may experience a mild rash up to 24 hours after the test. This is not dangerous. If this occurs, you can take Benadryl 25 mg and increase your fluid intake. If you experience trouble breathing, this can be serious. If it is severe call 911 IMMEDIATELY. If it is mild, please call our office.  We will call to schedule your test 2-4 weeks out understanding that some insurance companies will need an authorization prior to the service being performed.   For non-scheduling related questions, please contact the cardiac imaging nurse navigator should you have any questions/concerns: Marchia Bond, Cardiac Imaging Nurse Navigator Gordy Clement, Cardiac Imaging Nurse Navigator [facility] Direct Office Dial: [phone]   For scheduling needs, including cancellations and rescheduling, please call [place], [phone].    Follow-Up: At [facility], you and your health needs are our priority.  As part of our continuing mission to provide you with exceptional heart care, we have created designated Provider Care Teams.  These Care Teams include your primary Cardiologist (physician) and Advanced Practice Providers (APPs -  Physician Assistants and Nurse Practitioners) who all work together to  provide you with the care you need, when you need it.  We recommend signing up for the patient portal called "MyChart".  Sign up information is provided on this After Visit Summary.  MyChart is used to connect with patients for Virtual Visits (Telemedicine).  Patients are able to view lab/test results, encounter notes, upcoming  appointments, etc.  Non-urgent messages can be sent to your provider as well.   To learn more about what you can do with MyChart, go to [email or web address].    Your next appointment:   2 month(s)  The format for your next appointment:   In Person  Provider:   Skeet Latch, MD or Laurann Montana, NP  Other Instructions Heart Healthy Diet Recommendations: A low-salt diet is recommended. Meats should be grilled, baked, or boiled. Avoid fried foods. Focus on lean protein sources like fish or chicken with vegetables and fruits. The American Heart Association is a Microbiologist!  American Heart Association Diet and Lifeystyle Recommendations   Exercise recommendations: The American Heart Association recommends 150 minutes of moderate intensity exercise weekly. Try 30 minutes of moderate intensity exercise 4-5 times per week. This could include walking, jogging, or swimming.   Important Information About Sugar

## 2022-01-05 ENCOUNTER — Encounter (HOSPITAL_BASED_OUTPATIENT_CLINIC_OR_DEPARTMENT_OTHER): Payer: Self-pay

## 2022-01-05 NOTE — Telephone Encounter (Signed)
Updated Bp log

## 2022-01-12 DIAGNOSIS — F4323 Adjustment disorder with mixed anxiety and depressed mood: Secondary | ICD-10-CM | POA: Diagnosis not present

## 2022-01-17 ENCOUNTER — Telehealth (HOSPITAL_COMMUNITY): Payer: Self-pay | Admitting: Emergency Medicine

## 2022-01-17 NOTE — Telephone Encounter (Signed)
Reaching out to patient to offer assistance regarding upcoming cardiac imaging study; pt verbalizes understanding of appt date/time, parking situation and where to check in, pre-test NPO status and medications ordered, and verified current allergies; name and call back number provided for further questions should they arise Marchia Bond RN Navigator Cardiac Imaging [facility] [phone] office [phone] cell   Arrival 1200 w/c entrance  Atenolol 2 hr prior to scan Hold amlodipine, lasix Denies iv issues Aware nitro

## 2022-01-18 ENCOUNTER — Ambulatory Visit (HOSPITAL_COMMUNITY)
Admission: RE | Admit: 2022-01-18 | Discharge: 2022-01-18 | Disposition: A | Discharge status: Home / Self Care | Payer: Medicare Other | Source: Ambulatory Visit | Attending: Family | Admitting: Family

## 2022-01-18 DIAGNOSIS — R072 Precordial pain: Secondary | ICD-10-CM | POA: Diagnosis not present

## 2022-01-18 MED ORDER — METOPROLOL TARTRATE 5 MG/5ML IV SOLN
INTRAVENOUS | Status: AC
  Filled 2022-01-18: qty 10

## 2022-01-18 MED ORDER — NITROGLYCERIN 0.4 MG SL SUBL
SUBLINGUAL_TABLET | SUBLINGUAL | Status: AC
  Filled 2022-01-18: qty 2

## 2022-01-18 MED ORDER — IOHEXOL 350 MG/ML SOLN
100.0000 mL | Freq: Once | INTRAVENOUS | Status: AC | PRN
  Administered 2022-01-18: 100 mL via INTRAVENOUS

## 2022-01-18 MED ORDER — METOPROLOL TARTRATE 5 MG/5ML IV SOLN
5.0000 mg | INTRAVENOUS | Status: DC | PRN
  Administered 2022-01-18: 5 mg via INTRAVENOUS

## 2022-01-18 MED ORDER — NITROGLYCERIN 0.4 MG SL SUBL
0.8000 mg | SUBLINGUAL_TABLET | Freq: Once | SUBLINGUAL | Status: AC
  Administered 2022-01-18: 0.8 mg via SUBLINGUAL

## 2022-01-19 ENCOUNTER — Telehealth (HOSPITAL_BASED_OUTPATIENT_CLINIC_OR_DEPARTMENT_OTHER): Payer: Self-pay

## 2022-01-19 MED ORDER — ASPIRIN 81 MG PO TBEC: 81.0000 mg | DELAYED_RELEASE_TABLET | Freq: Every day | ORAL | 3 refills | Status: DC

## 2022-01-19 MED ORDER — ASPIRIN 81 MG PO TBEC: 81.0000 mg | DELAYED_RELEASE_TABLET | Freq: Every day | ORAL | 3 refills | Status: AC

## 2022-01-19 MED ORDER — EZETIMIBE 10 MG PO TABS: 10.0000 mg | ORAL_TABLET | Freq: Every day | ORAL | 11 refills | Status: DC

## 2022-01-19 NOTE — Addendum Note (Signed)
Addended by: Gerald Stabs on: 01/19/2022 11:48 AM   Modules accepted: Orders

## 2022-01-19 NOTE — Addendum Note (Signed)
Addended by: Gerald Stabs on: 01/19/2022 04:17 PM   Modules accepted: Orders

## 2022-01-19 NOTE — Telephone Encounter (Signed)
Zetia Rx to the pharmacy per patient request. Patient already has lab slips so will instruct her to use those for 2 month LP check.     "----- Message from Loel Dubonnet, NP sent at 01/18/2022  4:25 PM EDT ----- Cardiac CTA coronary calcium score 121. Overall minimal nonobstructive coronary disease with <25% blockage. REcommend secondary prevention.   Start Aspirin EC '81mg'$  daily.  Need to obtain LDL (bad cholesterol) goal <70. Last check 10/03/21 was 112. Recommend either addition of Zetia '10mg'$  daily with FLP in 2 months or establish with lipid clinic. "

## 2022-01-19 NOTE — Telephone Encounter (Addendum)
Seen by patient Shannon Brennan on 01/18/2022  4:55 PM, ASA ordered and follow up mychart message sent to patient to see if she would like to trial Zetia vs. Lipid clinic     ----- Message from Loel Dubonnet, NP sent at 01/18/2022  4:25 PM EDT ----- Cardiac CTA coronary calcium score 121. Overall minimal nonobstructive coronary disease with <25% blockage. REcommend secondary prevention.   Start Aspirin EC '81mg'$  daily.  Need to obtain LDL (bad cholesterol) goal <70. Last check 10/03/21 was 112. Recommend either addition of Zetia '10mg'$  daily with FLP in 2 months or establish with lipid clinic.

## 2022-01-20 ENCOUNTER — Encounter (HOSPITAL_BASED_OUTPATIENT_CLINIC_OR_DEPARTMENT_OTHER): Payer: Self-pay

## 2022-01-24 DIAGNOSIS — F4323 Adjustment disorder with mixed anxiety and depressed mood: Secondary | ICD-10-CM | POA: Diagnosis not present

## 2022-01-25 DIAGNOSIS — H5213 Myopia, bilateral: Secondary | ICD-10-CM | POA: Diagnosis not present

## 2022-02-09 DIAGNOSIS — F4323 Adjustment disorder with mixed anxiety and depressed mood: Secondary | ICD-10-CM | POA: Diagnosis not present

## 2022-02-22 DIAGNOSIS — I10 Essential (primary) hypertension: Secondary | ICD-10-CM | POA: Diagnosis not present

## 2022-02-22 DIAGNOSIS — E7800 Pure hypercholesterolemia, unspecified: Secondary | ICD-10-CM | POA: Diagnosis not present

## 2022-02-22 LAB — COMPREHENSIVE METABOLIC PANEL
ALT: 20 IU/L (ref 0–32)
AST: 20 IU/L (ref 0–40)
Albumin/Globulin Ratio: 2.4 — ABNORMAL HIGH (ref 1.2–2.2)
Albumin: 4.7 g/dL (ref 3.8–4.8)
Alkaline Phosphatase: 65 IU/L (ref 44–121)
BUN/Creatinine Ratio: 18 (ref 12–28)
BUN: 17 mg/dL (ref 8–27)
Bilirubin Total: 0.6 mg/dL (ref 0.0–1.2)
CO2: 24 mmol/L (ref 20–29)
Calcium: 9.5 mg/dL (ref 8.7–10.3)
Chloride: 102 mmol/L (ref 96–106)
Creatinine, Ser: 0.93 mg/dL (ref 0.57–1.00)
Globulin, Total: 2 g/dL (ref 1.5–4.5)
Glucose: 99 mg/dL (ref 70–99)
Potassium: 4.3 mmol/L (ref 3.5–5.2)
Sodium: 140 mmol/L (ref 134–144)
Total Protein: 6.7 g/dL (ref 6.0–8.5)
eGFR: 63 mL/min/{1.73_m2} (ref >59)

## 2022-02-22 LAB — LIPID PANEL
Chol/HDL Ratio: 2.8 ratio (ref 0.0–4.4)
Cholesterol, Total: 173 mg/dL (ref 100–199)
HDL: 61 mg/dL (ref >39)
LDL Chol Calc (NIH): 92 mg/dL (ref 0–99)
Triglycerides: 114 mg/dL (ref 0–149)
VLDL Cholesterol Cal: 20 mg/dL (ref 5–40)

## 2022-02-23 DIAGNOSIS — F4323 Adjustment disorder with mixed anxiety and depressed mood: Secondary | ICD-10-CM | POA: Diagnosis not present

## 2022-02-28 NOTE — Progress Notes (Unsigned)
Office Visit    Patient Name: Shannon Brennan Date of Encounter: 03/01/2022  PCP:  Prince Solian, [place]  Cardiologist:  Skeet Latch, MD  Advanced Practice Provider:  No care team member to display Electrophysiologist:  None      Chief Complaint    Shannon Brennan is a 76 y.o. female presents today for blood pressure follow up   Past Medical History    Past Medical History:  Diagnosis Date   Allergic rhinitis    Arthritis    mild in right thumb   Diverticulosis    Essential hypertension 12/01/2021   Family history of cardiovascular disease    Hepatic hemangioma    history of -- followe by Dr. Cristina Gong   Hx of osteopenia    Hyperlipidemia    tolerates low dose Lipitor   Hypertension    controlled with atenolol   Insomnia    Mitral valve prolapse    last echo in 2005 showing mild MVP with normal LV function   Normal nuclear stress test 2006   Palpitations    Pyuria    UTI (urinary tract infection)    Vaginal atrophy    Past Surgical History:  Procedure Laterality Date   ABDOMINAL HYSTERECTOMY     LUMBAR LAMINECTOMY/DECOMPRESSION MICRODISCECTOMY  01/04/2012   Procedure: LUMBAR LAMINECTOMY/DECOMPRESSION MICRODISCECTOMY 1 LEVEL;  Surgeon: Kristeen Miss, MD;  Location: [facility];  Service: Neurosurgery;  Laterality: Left;  Left Lumbar Five-Sacral One Microdiskectomy   PARTIAL HYSTERECTOMY     TONSILLECTOMY      Allergies  Allergies  Allergen Reactions   Erythromycin Diarrhea   Flagyl [Metronidazole Hcl] Nausea Only   Levofloxacin Other (See Comments)    Headache   Wellbutrin [Bupropion Hcl] Other (See Comments)    Dizziness   Zoloft [Sertraline Hcl]     Jittery    Restoril Palpitations    History of Present Illness    Shannon Brennan is a 76 y.o. female with a hx of arthritis, diverticulosis, hepatic hemangioma, osteopenia, HTN, HLD, moderate PAH, MVP last seen 12/30/21  Seen 11/30/2021 by Dr. Oval Linsey with BP not at  goal. Atenolol transitioned to Carvedilol. However, she felt heart rate not well controlled and returned to Atenolol. At follow up 12/30/21 Amlodipine added for BP control. Cardiac CTA ordered due to family history of coronary disease. Cardiac CTA coronary calcium score of 121 placing her at the 59th percentile for age, sex, race matched control.  Minimal nonobstructive CAD (1-24%) in LAD, LCx.   Presents today for follow-up independently. Very pleasant women with f/u for hypertension with no current concerns. She notes that her blood pressures at home have been averaging below 120/80 consistently. She has been taking her blood pressures in the morning. Amlodipine was prescribed last visit and she took this very few times and now has not been taking it due to normal at home blood pressures. She tends to get lightheaded when her blood pressures are 43'P systolic, however she notes that after water and some pretzels the lightheadedness quickly improves.   She notes that since last visit she has been focusing on lifestyle modifications including walking in her house five days a week and eating healthier meals. She notes that she feels better overall and is happy with her weight loss. She has not been taking her Zetia due to her lifestyle modifications and desire to limit her medications. She notes that she is happy with improvement in her  LDL and plans to continue to exercise.   She denies chest pain/pressure/tightness, syncope, lightheadedness, SOB, DOE, orthopnea, and PND.   EKGs/Labs/Other Studies Reviewed:   The following studies were reviewed today:  Coronary CTA 01/18/22 Coronary Arteries:  Normal coronary origin.  Left dominance.   Coronary Calcium Score:   Left main: 0   Left anterior descending artery: 41   Left circumflex artery: 80   Right coronary artery: 0   Total: 121   Percentile: 59th for age, sex, and race matched control.   RCA is a small non-dominant artery.  There is no  significant plaque.   Left main is a large artery that gives rise to LAD and LCX arteries. There is no significant plaque.   LAD is a large vessel that gives rise to multiple diagonal vessels. Minimal non-obstructive calcified plaques (1-24%) in the proximal and mid LAD.   LCX is a dominant artery that gives rise multiple OM vessels and an L-PDA. Minimal non-obstructive calcified plaques (1-24%) in the mid LCX.   Other findings:   Aorta: Normal size.  Aortic atherosclerosis.  No dissection.   Main Pulmonary Artery: Normal size of the pulmonary artery.   Systemic Veins: Normal drainage   Aortic Valve:  Tri-leaflet.  No calcifications.   Mitral valve: Minimal calcification   Normal pulmonary vein drainage into the left atrium.   Normal left atrial appendage without a thrombus.   Interatrial septum with no communications   Left Ventricle: Normal size   Left Atrium: Mild dilation   Right Ventricle: Normal size   Right Atrium: Normal size   Pericardium: Normal thickness   Extra-cardiac findings: See attached radiology report for non-cardiac structures.   Artifact: Cardiac motion slab artifact   IMPRESSION: 1. Coronary calcium score of 121. This was 59th percentile for age, sex, and race matched control.   2. CAD-RADS 1. Minimal non-obstructive CAD (1-24%). Consider non-atherosclerotic causes of chest pain. Consider preventive therapy and risk factor modification.   3. Aortic atherosclerosis  Echo 12/06/21  1. Left ventricular ejection fraction, by estimation, is 55 to 60%. The  left ventricle has normal function. The left ventricle has no regional  wall motion abnormalities. There is mild asymmetric left ventricular  hypertrophy of the basal-septal segment.  Left ventricular diastolic parameters are consistent with Grade I  diastolic dysfunction (impaired relaxation).   2. Right ventricular systolic function is normal. The right ventricular  size is normal.  There is normal pulmonary artery systolic pressure.   3. Left atrial size was mild to moderately dilated.   4. The mitral valve is abnormal. Trivial mitral valve regurgitation. No  evidence of mitral stenosis. There is mild prolapse of both leaflets of  the mitral valve.   5. The aortic valve is normal in structure. Aortic valve regurgitation is  not visualized. No aortic stenosis is present.   6. The inferior vena cava is normal in size with greater than 50%  respiratory variability, suggesting right atrial pressure of 3 mmHg.   Echo 11/15/2018: 1. The left ventricle has normal systolic function with an ejection  fraction of 60-65%. The cavity size was normal. Left ventricular diastolic  Doppler parameters are consistent with impaired relaxation. No evidence of  left ventricular regional wall  motion abnormalities.   2. The right ventricle has normal systolic function. The cavity was  normal. There is no increase in right ventricular wall thickness.   3. Moderate mitral valve prolapse.   4. The mitral valve is abnormal. Mild  thickening of the mitral valve  leaflet. The MR jet is posteriorly-directed.   5. The tricuspid valve is grossly normal.   6. The aortic valve is tricuspid. No stenosis of the aortic valve.   7. The ascending aorta and aortic root are normal in size and structure.   8. When compared to the prior study: 10/08/17 EF 55-60. Trivial MR.    Echo 10/08/2017: - Left ventricle: The cavity size was normal. There was mild focal    basal hypertrophy of the septum. Systolic function was normal.    The estimated ejection fraction was in the range of 55% to 60%.    Wall motion was normal; there were no regional wall motion    abnormalities. Doppler parameters are consistent with abnormal    left ventricular relaxation (grade 1 diastolic dysfunction).  - Aortic valve: There was trivial regurgitation.  - Mitral valve: Mild prolapse, involving the anterior leaflet and    the  posterior leaflet.  - Left atrium: The atrium was mildly dilated.    Echo 06/12/2017: - Left ventricle: The cavity size was normal. Systolic function was    normal. The estimated ejection fraction was in the range of 55%    to 60%. Wall motion was normal; there were no regional wall    motion abnormalities. Features are consistent with a pseudonormal    left ventricular filling pattern, with concomitant abnormal    relaxation and increased filling pressure (grade 2 diastolic    dysfunction). Doppler parameters are consistent with high    ventricular filling pressure.  - Mitral valve: Mild, late systolicprolapse, involving the anterior    leaflet and the posterior leaflet. There was trivial    regurgitation.  - Left atrium: The atrium was mildly dilated.  - Right atrium: The atrium was mildly dilated.  - Pulmonary arteries: PA peak pressure: 48 mm Hg (S).   Impressions:  - The right ventricular systolic pressure was increased consistent    with moderate pulmonary hypertension.    ETT 12/28/2015: Negative GXT The patient walked for 6 minutes on a Bruce protocol GXT. The peak H R was 149 which is 97% predicted maximal HR . There were no ST or T wave changes to suggest ischemia. Normal BP response   The patient walked for a total of 6 minutes on a Bruce protocol GXT.   The patient achieved a peak HR of 149 which is 97%  predicted maximal HR.   There were no ST changes to suggest ischemia.   The BP response to exercise was normal.    EKG:  No EKG today.   Recent Labs: 02/22/2022: ALT 20; BUN 17; Creatinine, Ser 0.93; Potassium 4.3; Sodium 140  Recent Lipid Panel    Component Value Date/Time   CHOL 173 02/22/2022 0810   TRIG 114 02/22/2022 0810   HDL 61 02/22/2022 0810   CHOLHDL 2.8 02/22/2022 0810   CHOLHDL 2.3 07/14/2015 0917   VLDL 18 07/14/2015 0917   LDLCALC 92 02/22/2022 0810   LDLDIRECT 133.0 02/05/2013 1512   Home Medications   Current Meds  Medication Sig   aspirin  EC 81 MG tablet Take 1 tablet (81 mg total) by mouth daily. Swallow whole.   atenolol (TENORMIN) 25 MG tablet Take 0.5 tablets (12.5 mg total) by mouth daily.   atorvastatin (LIPITOR) 10 MG tablet Take 0.5 tablets (5 mg total) by mouth daily. 0.5 mg by mouth 5 times a week   furosemide (LASIX) 20 MG tablet Take 20  mg by mouth daily as needed.   Nutritional Supplements (MELATONIN PO) Take 5 mg by mouth at bedtime.   Probiotic Product (ALIGN PO) Take by mouth in the morning and at bedtime. 2 tabs   RESTASIS 0.05 % ophthalmic emulsion 1 drop 2 (two) times daily.   traZODone (DESYREL) 50 MG tablet Take 100 mg by mouth daily.     Review of Systems      All other systems reviewed and are otherwise negative except as noted above.  Physical Exam    VS:  BP 103/72   Pulse 62   Ht '5\' 3"'$  (1.6 m)   Wt 126 lb 6.4 oz (57.3 kg)   BMI 22.39 kg/m  , BMI Body mass index is 22.39 kg/m.  Wt Readings from Last 3 Encounters:  03/01/22 126 lb 6.4 oz (57.3 kg)  12/30/21 128 lb (58.1 kg)  12/01/21 126 lb 11.2 oz (57.5 kg)     GEN: Well nourished, well developed, in no acute distress. HEENT: normal. Neck: Supple, no JVD, carotid bruits, or masses. Cardiac: RRR, no murmurs, rubs, or gallops. No clubbing, cyanosis, edema.  Radials/PT 2+ and equal bilaterally.  Respiratory:  Respirations regular and unlabored, clear to auscultation bilaterally. GI: Soft, nontender, nondistended. MS: No deformity or atrophy. Skin: Warm and dry, no rash. Neuro:  Strength and sensation are intact. Psych: Normal affect.  Assessment & Plan    MVP - Echo 12/06/21 mild MVP. Repeat echo in 11/2022 for monitoring. Continue optimal BP, volume control. She denies SOB, DOE.  HLD, LDL goal <70- 02/22/22 LDL 92. Continue Atorvastatin '5mg'$  5 times per week (previous myalgias with higher doses). She will trial Zetia 3 times a week for two weeks and then daily. She remains hesitant but is willing to incorporate Zetia to reach LDL goal  <70. Continue lifestyle modifications including daily exercise and heart healthy dieting. Repeat FLP/LFT in 2-3 months.   HTN - Well controlled at home averaging <120/80. Continue current antihypertensive regimen and daily blood pressures.   CAD -cardiac CTA coronary calcium score of 121 placing her at the 59th percentile for age, sex, race matched control.  Minimal nonobstructive CAD (1-24%) in LAD, LCx. Continue optimal blood pressure control and lifestyle modifications. No chest pain, SOB. GDMT Aspirin, Atenolol, Atorvastatin, Zetia.     Disposition: Follow up  after echo 11/2022  with Skeet Latch, MD or APP.  Signed, Loel Dubonnet, NP 03/01/2022, 8:07 AM [place]

## 2022-03-01 ENCOUNTER — Ambulatory Visit (HOSPITAL_BASED_OUTPATIENT_CLINIC_OR_DEPARTMENT_OTHER): Payer: Medicare Other | Admitting: Family

## 2022-03-01 ENCOUNTER — Encounter (HOSPITAL_BASED_OUTPATIENT_CLINIC_OR_DEPARTMENT_OTHER): Payer: Self-pay | Admitting: Family

## 2022-03-01 VITALS — BP 103/72 | HR 62 | Ht 63.0 in | Wt 126.4 lb

## 2022-03-01 DIAGNOSIS — E7800 Pure hypercholesterolemia, unspecified: Secondary | ICD-10-CM | POA: Diagnosis not present

## 2022-03-01 DIAGNOSIS — E785 Hyperlipidemia, unspecified: Secondary | ICD-10-CM

## 2022-03-01 DIAGNOSIS — I25118 Atherosclerotic heart disease of native coronary artery with other forms of angina pectoris: Secondary | ICD-10-CM | POA: Diagnosis not present

## 2022-03-01 DIAGNOSIS — I341 Nonrheumatic mitral (valve) prolapse: Secondary | ICD-10-CM | POA: Diagnosis not present

## 2022-03-01 DIAGNOSIS — I10 Essential (primary) hypertension: Secondary | ICD-10-CM

## 2022-03-01 MED ORDER — EZETIMIBE 10 MG PO TABS: 10.0000 mg | ORAL_TABLET | Freq: Every day | ORAL | 3 refills | Status: DC

## 2022-03-01 NOTE — Patient Instructions (Signed)
Medication Instructions:  Your physician has recommended you make the following change in your medication:   Stop: Amlodipine   Start: Zetia '10mg'$  three times per week for 2 weeks then everyday   *If you need a refill on your cardiac medications before your next appointment, please call your pharmacy*   Lab Work: Please return for Lab work after taking zetia regularly for 2-3 months for fasting Lipid Panel and Liver Function Tests . You may come to the...   [facility] (3rd floor) [address] [place], [place] [address]  Open: 8am-Noon and 1pm-4:30pm  Please ring the doorbell on the small table when you exit the elevator and the Lab Tech will come get you  [place] [facility] [address] [place], [place], [place] [address] Open: 8am-1pm, then 2pm-4:30pm   [place]- Please see attached locations sheet stapled to your lab work with address and hours.   If you have labs (blood work) drawn today and your tests are completely normal, you will receive your results only by: [place] (if you have MyChart) OR A paper copy in the mail If you have any lab test that is abnormal or we need to change your treatment, we will call you to review the results.   Testing/Procedures: Your physician has requested that you have an echocardiogram in August. Echocardiography is a painless test that uses sound waves to create images of your heart. It provides your doctor with information about the size and shape of your heart and how well your heart's chambers and valves are working. This procedure takes approximately one hour. There are no restrictions for this procedure. [place], you and your health needs are our priority.  As part of our continuing mission to provide you with exceptional heart care, we have created designated Provider Care Teams.  These Care Teams include your  primary Cardiologist (physician) and Advanced Practice Providers (APPs -  Physician Assistants and Nurse Practitioners) who all work together to provide you with the care you need, when you need it.  We recommend signing up for the patient portal called "MyChart".  Sign up information is provided on this After Visit Summary.  MyChart is used to connect with patients for Virtual Visits (Telemedicine).  Patients are able to view lab/test results, encounter notes, upcoming appointments, etc.  Non-urgent messages can be sent to your provider as well.   To learn more about what you can do with MyChart, go to [email or web address].    Your next appointment:   Follow up after Echo with either Dr. Oval Linsey or Laurann Montana, NP   Other Instructions Heart Healthy Diet Recommendations: A low-salt diet is recommended. Meats should be grilled, baked, or boiled. Avoid fried foods. Focus on lean protein sources like fish or chicken with vegetables and fruits. The American Heart Association is a Microbiologist!  American Heart Association Diet and Lifeystyle Recommendations   Exercise recommendations: The American Heart Association recommends 150 minutes of moderate intensity exercise weekly. Try 30 minutes of moderate intensity exercise 4-5 times per week. This could include walking, jogging, or swimming.   Important Information About Sugar

## 2022-03-09 DIAGNOSIS — F4323 Adjustment disorder with mixed anxiety and depressed mood: Secondary | ICD-10-CM | POA: Diagnosis not present

## 2022-03-20 DIAGNOSIS — F4323 Adjustment disorder with mixed anxiety and depressed mood: Secondary | ICD-10-CM | POA: Diagnosis not present

## 2022-04-05 DIAGNOSIS — F4323 Adjustment disorder with mixed anxiety and depressed mood: Secondary | ICD-10-CM | POA: Diagnosis not present

## 2022-04-06 DIAGNOSIS — I700 Atherosclerosis of aorta: Secondary | ICD-10-CM | POA: Diagnosis not present

## 2022-04-06 DIAGNOSIS — I341 Nonrheumatic mitral (valve) prolapse: Secondary | ICD-10-CM | POA: Diagnosis not present

## 2022-04-06 DIAGNOSIS — E785 Hyperlipidemia, unspecified: Secondary | ICD-10-CM | POA: Diagnosis not present

## 2022-04-06 DIAGNOSIS — I25118 Atherosclerotic heart disease of native coronary artery with other forms of angina pectoris: Secondary | ICD-10-CM | POA: Diagnosis not present

## 2022-04-17 DIAGNOSIS — F4323 Adjustment disorder with mixed anxiety and depressed mood: Secondary | ICD-10-CM | POA: Diagnosis not present

## 2022-05-02 DIAGNOSIS — M9902 Segmental and somatic dysfunction of thoracic region: Secondary | ICD-10-CM | POA: Diagnosis not present

## 2022-05-02 DIAGNOSIS — M9901 Segmental and somatic dysfunction of cervical region: Secondary | ICD-10-CM | POA: Diagnosis not present

## 2022-05-02 DIAGNOSIS — M546 Pain in thoracic spine: Secondary | ICD-10-CM | POA: Diagnosis not present

## 2022-05-02 DIAGNOSIS — M9903 Segmental and somatic dysfunction of lumbar region: Secondary | ICD-10-CM | POA: Diagnosis not present

## 2022-05-02 DIAGNOSIS — M9905 Segmental and somatic dysfunction of pelvic region: Secondary | ICD-10-CM | POA: Diagnosis not present

## 2022-05-02 DIAGNOSIS — M5013 Cervical disc disorder with radiculopathy, cervicothoracic region: Secondary | ICD-10-CM | POA: Diagnosis not present

## 2022-05-04 DIAGNOSIS — M5013 Cervical disc disorder with radiculopathy, cervicothoracic region: Secondary | ICD-10-CM | POA: Diagnosis not present

## 2022-05-04 DIAGNOSIS — M9901 Segmental and somatic dysfunction of cervical region: Secondary | ICD-10-CM | POA: Diagnosis not present

## 2022-05-04 DIAGNOSIS — F4323 Adjustment disorder with mixed anxiety and depressed mood: Secondary | ICD-10-CM | POA: Diagnosis not present

## 2022-05-04 DIAGNOSIS — M9902 Segmental and somatic dysfunction of thoracic region: Secondary | ICD-10-CM | POA: Diagnosis not present

## 2022-05-04 DIAGNOSIS — M546 Pain in thoracic spine: Secondary | ICD-10-CM | POA: Diagnosis not present

## 2022-05-09 DIAGNOSIS — M546 Pain in thoracic spine: Secondary | ICD-10-CM | POA: Diagnosis not present

## 2022-05-09 DIAGNOSIS — M5013 Cervical disc disorder with radiculopathy, cervicothoracic region: Secondary | ICD-10-CM | POA: Diagnosis not present

## 2022-05-09 DIAGNOSIS — M9902 Segmental and somatic dysfunction of thoracic region: Secondary | ICD-10-CM | POA: Diagnosis not present

## 2022-05-09 DIAGNOSIS — M9901 Segmental and somatic dysfunction of cervical region: Secondary | ICD-10-CM | POA: Diagnosis not present

## 2022-05-11 DIAGNOSIS — M9901 Segmental and somatic dysfunction of cervical region: Secondary | ICD-10-CM | POA: Diagnosis not present

## 2022-05-11 DIAGNOSIS — M5013 Cervical disc disorder with radiculopathy, cervicothoracic region: Secondary | ICD-10-CM | POA: Diagnosis not present

## 2022-05-11 DIAGNOSIS — M9902 Segmental and somatic dysfunction of thoracic region: Secondary | ICD-10-CM | POA: Diagnosis not present

## 2022-05-11 DIAGNOSIS — M546 Pain in thoracic spine: Secondary | ICD-10-CM | POA: Diagnosis not present

## 2022-05-16 DIAGNOSIS — M9901 Segmental and somatic dysfunction of cervical region: Secondary | ICD-10-CM | POA: Diagnosis not present

## 2022-05-16 DIAGNOSIS — M5013 Cervical disc disorder with radiculopathy, cervicothoracic region: Secondary | ICD-10-CM | POA: Diagnosis not present

## 2022-05-16 DIAGNOSIS — M546 Pain in thoracic spine: Secondary | ICD-10-CM | POA: Diagnosis not present

## 2022-05-16 DIAGNOSIS — M9902 Segmental and somatic dysfunction of thoracic region: Secondary | ICD-10-CM | POA: Diagnosis not present

## 2022-05-18 DIAGNOSIS — M5013 Cervical disc disorder with radiculopathy, cervicothoracic region: Secondary | ICD-10-CM | POA: Diagnosis not present

## 2022-05-18 DIAGNOSIS — F4323 Adjustment disorder with mixed anxiety and depressed mood: Secondary | ICD-10-CM | POA: Diagnosis not present

## 2022-05-18 DIAGNOSIS — M546 Pain in thoracic spine: Secondary | ICD-10-CM | POA: Diagnosis not present

## 2022-05-18 DIAGNOSIS — M9901 Segmental and somatic dysfunction of cervical region: Secondary | ICD-10-CM | POA: Diagnosis not present

## 2022-05-18 DIAGNOSIS — M9902 Segmental and somatic dysfunction of thoracic region: Secondary | ICD-10-CM | POA: Diagnosis not present

## 2022-05-23 DIAGNOSIS — M9901 Segmental and somatic dysfunction of cervical region: Secondary | ICD-10-CM | POA: Diagnosis not present

## 2022-05-23 DIAGNOSIS — M9902 Segmental and somatic dysfunction of thoracic region: Secondary | ICD-10-CM | POA: Diagnosis not present

## 2022-05-23 DIAGNOSIS — M9903 Segmental and somatic dysfunction of lumbar region: Secondary | ICD-10-CM | POA: Diagnosis not present

## 2022-05-23 DIAGNOSIS — M9905 Segmental and somatic dysfunction of pelvic region: Secondary | ICD-10-CM | POA: Diagnosis not present

## 2022-05-25 DIAGNOSIS — M9905 Segmental and somatic dysfunction of pelvic region: Secondary | ICD-10-CM | POA: Diagnosis not present

## 2022-05-25 DIAGNOSIS — M9903 Segmental and somatic dysfunction of lumbar region: Secondary | ICD-10-CM | POA: Diagnosis not present

## 2022-05-25 DIAGNOSIS — M9902 Segmental and somatic dysfunction of thoracic region: Secondary | ICD-10-CM | POA: Diagnosis not present

## 2022-05-25 DIAGNOSIS — M9901 Segmental and somatic dysfunction of cervical region: Secondary | ICD-10-CM | POA: Diagnosis not present

## 2022-05-29 DIAGNOSIS — Z85820 Personal history of malignant melanoma of skin: Secondary | ICD-10-CM | POA: Diagnosis not present

## 2022-05-29 DIAGNOSIS — L820 Inflamed seborrheic keratosis: Secondary | ICD-10-CM | POA: Diagnosis not present

## 2022-05-29 DIAGNOSIS — Z85828 Personal history of other malignant neoplasm of skin: Secondary | ICD-10-CM | POA: Diagnosis not present

## 2022-05-29 DIAGNOSIS — L821 Other seborrheic keratosis: Secondary | ICD-10-CM | POA: Diagnosis not present

## 2022-05-29 DIAGNOSIS — D2262 Melanocytic nevi of left upper limb, including shoulder: Secondary | ICD-10-CM | POA: Diagnosis not present

## 2022-06-01 DIAGNOSIS — M9902 Segmental and somatic dysfunction of thoracic region: Secondary | ICD-10-CM | POA: Diagnosis not present

## 2022-06-01 DIAGNOSIS — M9903 Segmental and somatic dysfunction of lumbar region: Secondary | ICD-10-CM | POA: Diagnosis not present

## 2022-06-01 DIAGNOSIS — M9905 Segmental and somatic dysfunction of pelvic region: Secondary | ICD-10-CM | POA: Diagnosis not present

## 2022-06-01 DIAGNOSIS — F4323 Adjustment disorder with mixed anxiety and depressed mood: Secondary | ICD-10-CM | POA: Diagnosis not present

## 2022-06-01 DIAGNOSIS — M9901 Segmental and somatic dysfunction of cervical region: Secondary | ICD-10-CM | POA: Diagnosis not present

## 2022-06-06 DIAGNOSIS — M9901 Segmental and somatic dysfunction of cervical region: Secondary | ICD-10-CM | POA: Diagnosis not present

## 2022-06-06 DIAGNOSIS — M9902 Segmental and somatic dysfunction of thoracic region: Secondary | ICD-10-CM | POA: Diagnosis not present

## 2022-06-06 DIAGNOSIS — M9903 Segmental and somatic dysfunction of lumbar region: Secondary | ICD-10-CM | POA: Diagnosis not present

## 2022-06-06 DIAGNOSIS — M9905 Segmental and somatic dysfunction of pelvic region: Secondary | ICD-10-CM | POA: Diagnosis not present

## 2022-06-08 DIAGNOSIS — M9902 Segmental and somatic dysfunction of thoracic region: Secondary | ICD-10-CM | POA: Diagnosis not present

## 2022-06-08 DIAGNOSIS — E7800 Pure hypercholesterolemia, unspecified: Secondary | ICD-10-CM | POA: Diagnosis not present

## 2022-06-08 DIAGNOSIS — I341 Nonrheumatic mitral (valve) prolapse: Secondary | ICD-10-CM | POA: Diagnosis not present

## 2022-06-08 DIAGNOSIS — M9905 Segmental and somatic dysfunction of pelvic region: Secondary | ICD-10-CM | POA: Diagnosis not present

## 2022-06-08 DIAGNOSIS — M9901 Segmental and somatic dysfunction of cervical region: Secondary | ICD-10-CM | POA: Diagnosis not present

## 2022-06-08 DIAGNOSIS — M9903 Segmental and somatic dysfunction of lumbar region: Secondary | ICD-10-CM | POA: Diagnosis not present

## 2022-06-09 ENCOUNTER — Telehealth (HOSPITAL_BASED_OUTPATIENT_CLINIC_OR_DEPARTMENT_OTHER): Payer: Self-pay

## 2022-06-09 DIAGNOSIS — I10 Essential (primary) hypertension: Secondary | ICD-10-CM

## 2022-06-09 DIAGNOSIS — E782 Mixed hyperlipidemia: Secondary | ICD-10-CM

## 2022-06-09 LAB — HEPATIC FUNCTION PANEL
ALT: 15 IU/L (ref 0–32)
AST: 21 IU/L (ref 0–40)
Albumin: 4.5 g/dL (ref 3.8–4.8)
Alkaline Phosphatase: 67 IU/L (ref 44–121)
Bilirubin Total: 0.6 mg/dL (ref 0.0–1.2)
Bilirubin, Direct: 0.15 mg/dL (ref 0.00–0.40)
Total Protein: 6.7 g/dL (ref 6.0–8.5)

## 2022-06-09 LAB — LIPID PANEL
Chol/HDL Ratio: 2.9 ratio (ref 0.0–4.4)
Cholesterol, Total: 189 mg/dL (ref 100–199)
HDL: 65 mg/dL (ref >39)
LDL Chol Calc (NIH): 106 mg/dL — ABNORMAL HIGH (ref 0–99)
Triglycerides: 101 mg/dL (ref 0–149)
VLDL Cholesterol Cal: 18 mg/dL (ref 5–40)

## 2022-06-09 NOTE — Telephone Encounter (Addendum)
Seen by patient Shannon Brennan on 06/09/2022  7:59 AM; ref placed and mychart message sent to patient.   ----- Message from Loel Dubonnet, NP sent at 06/09/2022  7:51 AM EST ----- Normal liver enzymes. LDL (bad cholesterol) of 106 which is not at goal of <70. Recommend referral to pharmacy lipid clinic.

## 2022-06-12 ENCOUNTER — Other Ambulatory Visit (INDEPENDENT_AMBULATORY_CARE_PROVIDER_SITE_OTHER): Payer: Medicare Other

## 2022-06-12 ENCOUNTER — Encounter (HOSPITAL_BASED_OUTPATIENT_CLINIC_OR_DEPARTMENT_OTHER): Payer: Self-pay | Admitting: Family

## 2022-06-12 ENCOUNTER — Ambulatory Visit (HOSPITAL_BASED_OUTPATIENT_CLINIC_OR_DEPARTMENT_OTHER): Payer: Medicare Other | Admitting: Family

## 2022-06-12 VITALS — BP 118/68 | HR 73 | Ht 63.0 in | Wt 127.0 lb

## 2022-06-12 DIAGNOSIS — R002 Palpitations: Secondary | ICD-10-CM | POA: Diagnosis not present

## 2022-06-12 DIAGNOSIS — E782 Mixed hyperlipidemia: Secondary | ICD-10-CM | POA: Diagnosis not present

## 2022-06-12 DIAGNOSIS — I25118 Atherosclerotic heart disease of native coronary artery with other forms of angina pectoris: Secondary | ICD-10-CM

## 2022-06-12 DIAGNOSIS — E7800 Pure hypercholesterolemia, unspecified: Secondary | ICD-10-CM | POA: Diagnosis not present

## 2022-06-12 DIAGNOSIS — I341 Nonrheumatic mitral (valve) prolapse: Secondary | ICD-10-CM

## 2022-06-12 MED ORDER — ATORVASTATIN CALCIUM 10 MG PO TABS: 5.0000 mg | ORAL_TABLET | Freq: Every day | ORAL | 3 refills | Status: DC

## 2022-06-12 NOTE — Patient Instructions (Signed)
Medication Instructions:  Your physician has recommended you make the following change in your medication:   Change: Atorvastatin 61m daily   *If you need a refill on your cardiac medications before your next appointment, please call your pharmacy*   Lab Work: Your physician recommends that you return for lab work today- BMP, CBC, Mag   If you have labs (blood work) drawn today and your tests are completely normal, you will receive your results only by: [place](if you have MyChart) OR A paper copy in the mail If you have any lab test that is abnormal or we need to change your treatment, we will call you to review the results.   Testing/Procedures: Your physician has recommended that you wear a Zio monitor.   This monitor is a medical device that records the heart's electrical activity. Doctors most often use these monitors to diagnose arrhythmias. Arrhythmias are problems with the speed or rhythm of the heartbeat. The monitor is a small device applied to your chest. You can wear one while you do your normal daily activities. While wearing this monitor if you have any symptoms to push the button and record what you felt. Once you have worn this monitor for the period of time provider prescribed (Usually 14 days), you will return the monitor device in the postage paid box. Once it is returned they will download the data collected and provide [place] a report which the provider will then review and we will call you with those results. Important tips:  Avoid showering during the first 24 hours of wearing the monitor. Avoid excessive sweating to help maximize wear time. Do not submerge the device, no hot tubs, and no swimming pools. Keep any lotions or oils away from the patch. After 24 hours you may shower with the patch on. Take brief showers with your back facing the shower head.  Do not remove patch once it has been placed because that will interrupt data and decrease adhesive wear  time. Push the button when you have any symptoms and write down what you were feeling. Once you have completed wearing your monitor, remove and place into box which has postage paid and place in your outgoing mailbox.  If for some reason you have misplaced your box then call our office and we can provide another box and/or mail it off for you.  Follow-Up: At [facility] you and your health needs are our priority.  As part of our continuing mission to provide you with exceptional heart care, we have created designated Provider Care Teams.  These Care Teams include your primary Cardiologist (physician) and Advanced Practice Providers (APPs -  Physician Assistants and Nurse Practitioners) who all work together to provide you with the care you need, when you need it.  We recommend signing up for the patient portal called "MyChart".  Sign up information is provided on this After Visit Summary.  MyChart is used to connect with patients for Virtual Visits (Telemedicine).  Patients are able to view lab/test results, encounter notes, upcoming appointments, etc.  Non-urgent messages can be sent to your provider as well.   To learn more about what you can do with MyChart, go to [email or web address]    Your next appointment:   August with Dr. ROval Linsey  Other Instructions Heart Healthy Diet Recommendations: A low-salt diet is recommended. Meats should be grilled, baked, or boiled. Avoid fried foods. Focus on lean protein sources like fish or chicken with vegetables and fruits.  The American Heart Association is a Microbiologist!  American Heart Association Diet and Lifeystyle Recommendations   Exercise recommendations: The American Heart Association recommends 150 minutes of moderate intensity exercise weekly. Try 30 minutes of moderate intensity exercise 4-5 times per week. This could include walking, jogging, or swimming.

## 2022-06-12 NOTE — Progress Notes (Signed)
Office Visit    Patient Name: Shannon Brennan Date of Encounter: 06/12/2022  PCP:  Prince Solian, [place]  Cardiologist:  Skeet Latch, MD  Advanced Practice Provider:  No care team member to display Electrophysiologist:  None   Chief Complaint    Shannon Brennan is a 77 y.o. female presents today for palpitations, discussion of cholesterol medication.   Past Medical History    Past Medical History:  Diagnosis Date   Allergic rhinitis    Arthritis    mild in right thumb   Diverticulosis    Essential hypertension 12/01/2021   Family history of cardiovascular disease    Hepatic hemangioma    history of -- followe by Dr. Cristina Gong   Hx of osteopenia    Hyperlipidemia    tolerates low dose Lipitor   Hypertension    controlled with atenolol   Insomnia    Mitral valve prolapse    last echo in 2005 showing mild MVP with normal LV function   Normal nuclear stress test 2006   Palpitations    Pyuria    UTI (urinary tract infection)    Vaginal atrophy    Past Surgical History:  Procedure Laterality Date   ABDOMINAL HYSTERECTOMY     LUMBAR LAMINECTOMY/DECOMPRESSION MICRODISCECTOMY  01/04/2012   Procedure: LUMBAR LAMINECTOMY/DECOMPRESSION MICRODISCECTOMY 1 LEVEL;  Surgeon: Kristeen Miss, MD;  Location: [facility];  Service: Neurosurgery;  Laterality: Left;  Left Lumbar Five-Sacral One Microdiskectomy   PARTIAL HYSTERECTOMY     TONSILLECTOMY      Allergies  Allergies  Allergen Reactions   Erythromycin Diarrhea   Flagyl [Metronidazole Hcl] Nausea Only   Levofloxacin Other (See Comments)    Headache   Wellbutrin [Bupropion Hcl] Other (See Comments)    Dizziness   Zoloft [Sertraline Hcl]     Jittery    Restoril Palpitations    History of Present Illness    Shannon Brennan is a 77 y.o. female with a hx of arthritis, diverticulosis, hepatic hemangioma, osteopenia, HTN, HLD, moderate PAH, MVP.  Seen 11/30/2021 by Dr. Oval Linsey with BP  not at goal. Atenolol transitioned to Carvedilol. However, she felt heart rate not well controlled and returned to Atenolol. At follow up 12/30/21 Amlodipine added for BP control. Cardiac CTA ordered due to family history of coronary disease. Cardiac CTA coronary calcium score of 121 placing her at the 59th percentile for age, sex, race matched control.  Minimal nonobstructive CAD (1-24%) in LAD, LCx.   She was seen 03/01/22 doing overall well and blood pressure was well controlled without Amlodipine. Zetia was added to her Atorvastatin regimen. However, she noted headache and had to discontinue. She was active walking in her house 5 days per week.  Presents today for follow-up independently.  Patient says she feels well other than she feels pauses in her heart and when she takes her blood pressure the machine is noting irregular heart beat. She is concerned about fluttering and pauses. She said these symptoms are happening multiple times throughout the day. Reports no shortness of breath nor dyspnea on exertion. Reports no chest pain, pressure, or tightness. No  orthopnea, PND. She said she doesn't really get swelling in her legs maybe a tad by end of day. She is walking 4-5 times per week at 20-25 minutes. She is trying to eat healthy and mostly cooks at home.  EKGs/Labs/Other Studies Reviewed:   The following studies were reviewed today:  Coronary CTA  01/18/22 Coronary Arteries:  Normal coronary origin.  Left dominance.   Coronary Calcium Score:   Left main: 0   Left anterior descending artery: 41   Left circumflex artery: 80   Right coronary artery: 0   Total: 121   Percentile: 59th for age, sex, and race matched control.   RCA is a small non-dominant artery.  There is no significant plaque.   Left main is a large artery that gives rise to LAD and LCX arteries. There is no significant plaque.   LAD is a large vessel that gives rise to multiple diagonal vessels. Minimal  non-obstructive calcified plaques (1-24%) in the proximal and mid LAD.   LCX is a dominant artery that gives rise multiple OM vessels and an L-PDA. Minimal non-obstructive calcified plaques (1-24%) in the mid LCX.   Other findings:   Aorta: Normal size.  Aortic atherosclerosis.  No dissection.   Main Pulmonary Artery: Normal size of the pulmonary artery.   Systemic Veins: Normal drainage   Aortic Valve:  Tri-leaflet.  No calcifications.   Mitral valve: Minimal calcification   Normal pulmonary vein drainage into the left atrium.   Normal left atrial appendage without a thrombus.   Interatrial septum with no communications   Left Ventricle: Normal size   Left Atrium: Mild dilation   Right Ventricle: Normal size   Right Atrium: Normal size   Pericardium: Normal thickness   Extra-cardiac findings: See attached radiology report for non-cardiac structures.   Artifact: Cardiac motion slab artifact   IMPRESSION: 1. Coronary calcium score of 121. This was 59th percentile for age, sex, and race matched control.   2. CAD-RADS 1. Minimal non-obstructive CAD (1-24%). Consider non-atherosclerotic causes of chest pain. Consider preventive therapy and risk factor modification.   3. Aortic atherosclerosis  Echo 12/06/21  1. Left ventricular ejection fraction, by estimation, is 55 to 60%. The  left ventricle has normal function. The left ventricle has no regional  wall motion abnormalities. There is mild asymmetric left ventricular  hypertrophy of the basal-septal segment.  Left ventricular diastolic parameters are consistent with Grade I  diastolic dysfunction (impaired relaxation).   2. Right ventricular systolic function is normal. The right ventricular  size is normal. There is normal pulmonary artery systolic pressure.   3. Left atrial size was mild to moderately dilated.   4. The mitral valve is abnormal. Trivial mitral valve regurgitation. No  evidence of mitral  stenosis. There is mild prolapse of both leaflets of  the mitral valve.   5. The aortic valve is normal in structure. Aortic valve regurgitation is  not visualized. No aortic stenosis is present.   6. The inferior vena cava is normal in size with greater than 50%  respiratory variability, suggesting right atrial pressure of 3 mmHg.   Echo 11/15/2018: 1. The left ventricle has normal systolic function with an ejection  fraction of 60-65%. The cavity size was normal. Left ventricular diastolic  Doppler parameters are consistent with impaired relaxation. No evidence of  left ventricular regional wall  motion abnormalities.   2. The right ventricle has normal systolic function. The cavity was  normal. There is no increase in right ventricular wall thickness.   3. Moderate mitral valve prolapse.   4. The mitral valve is abnormal. Mild thickening of the mitral valve  leaflet. The MR jet is posteriorly-directed.   5. The tricuspid valve is grossly normal.   6. The aortic valve is tricuspid. No stenosis of the aortic valve.  7. The ascending aorta and aortic root are normal in size and structure.   8. When compared to the prior study: 10/08/17 EF 55-60. Trivial MR.    Echo 10/08/2017: - Left ventricle: The cavity size was normal. There was mild focal    basal hypertrophy of the septum. Systolic function was normal.    The estimated ejection fraction was in the range of 55% to 60%.    Wall motion was normal; there were no regional wall motion    abnormalities. Doppler parameters are consistent with abnormal    left ventricular relaxation (grade 1 diastolic dysfunction).  - Aortic valve: There was trivial regurgitation.  - Mitral valve: Mild prolapse, involving the anterior leaflet and    the posterior leaflet.  - Left atrium: The atrium was mildly dilated.    Echo 06/12/2017: - Left ventricle: The cavity size was normal. Systolic function was    normal. The estimated ejection fraction was in  the range of 55%    to 60%. Wall motion was normal; there were no regional wall    motion abnormalities. Features are consistent with a pseudonormal    left ventricular filling pattern, with concomitant abnormal    relaxation and increased filling pressure (grade 2 diastolic    dysfunction). Doppler parameters are consistent with high    ventricular filling pressure.  - Mitral valve: Mild, late systolicprolapse, involving the anterior    leaflet and the posterior leaflet. There was trivial    regurgitation.  - Left atrium: The atrium was mildly dilated.  - Right atrium: The atrium was mildly dilated.  - Pulmonary arteries: PA peak pressure: 48 mm Hg (S).   Impressions:  - The right ventricular systolic pressure was increased consistent    with moderate pulmonary hypertension.    ETT 12/28/2015: Negative GXT The patient walked for 6 minutes on a Bruce protocol GXT. The peak H R was 149 which is 97% predicted maximal HR . There were no ST or T wave changes to suggest ischemia. Normal BP response   The patient walked for a total of 6 minutes on a Bruce protocol GXT.   The patient achieved a peak HR of 149 which is 97%  predicted maximal HR.   There were no ST changes to suggest ischemia.   The BP response to exercise was normal.    EKG:  No EKG today.   Recent Labs: 02/22/2022: BUN 17; Creatinine, Ser 0.93; Potassium 4.3; Sodium 140 06/08/2022: ALT 15  Recent Lipid Panel    Component Value Date/Time   CHOL 189 06/08/2022 0916   TRIG 101 06/08/2022 0916   HDL 65 06/08/2022 0916   CHOLHDL 2.9 06/08/2022 0916   CHOLHDL 2.3 07/14/2015 0917   VLDL 18 07/14/2015 0917   LDLCALC 106 (H) 06/08/2022 0916   LDLDIRECT 133.0 02/05/2013 1512   Home Medications   No outpatient medications have been marked as taking for the 06/12/22 encounter (Office Visit) with Loel Dubonnet, NP.     Review of Systems      All other systems reviewed and are otherwise negative except as noted  above.  Physical Exam    VS:  BP 118/68   Pulse 73   Ht 5' 3"$  (1.6 m)   Wt 127 lb (57.6 kg)   BMI 22.50 kg/m  , BMI Body mass index is 22.5 kg/m.  Wt Readings from Last 3 Encounters:  06/12/22 127 lb (57.6 kg)  03/01/22 126 lb 6.4 oz (57.3 kg)  12/30/21 128 lb (58.1 kg)     GEN: Well nourished, well developed, in no acute distress. HEENT: normal. Neck: Supple, no JVD, carotid bruits, or masses. Cardiac: RRR, no murmurs, rubs, or gallops. No clubbing, cyanosis, edema.  Radials/PT 2+ and equal bilaterally.  Respiratory:  Respirations regular and unlabored, clear to auscultation bilaterally. GI: Soft, nontender, nondistended. MS: No deformity or atrophy. Skin: Warm and dry, no rash. Neuro:  Strength and sensation are intact. Psych: Normal affect.  Assessment & Plan    MVP - Echo 12/06/21 mild MVP. Repeat echo in 11/2022 for monitoring. Continue optimal BP, volume control. No shortness of breath nor chest pain.    Palpitations: She experiencing multiple episodes of fluttering and pauses throughout the day. No lightheadedness, dizziness, near syncope, syncope. Known history of PVC. Does not drink caffeine nor etoh and endorses staying hydrated. Does endorse recent stressors. Hesitant to increase Atenolol as notes hypotension at home. Plan for to ZIO monitor.  Plan for CBC to rule out anemia. BMP/magnesium to rule out electrolyte abnormality. Of note, she did start OTC magnesium 2 months ago.    HLD, LDL goal <70- 06/08/22 LDL 106. Intolerant to Zetia with headache. Hesitant regarding PCSK9i. Increase Atorvastatin from 5 times per week to 7 times per week. FLP/LFT in 2 months. Reported headaches with Zetia. Continue lifestyle modifications including daily exercise and heart healthy dieting.   HTN - Well controlled at home averaging <120/80. Today's blood pressure 118/68. Continue current antihypertensive regimen and monitoring daily blood pressures. Encouraged to continue walking 4-5 times  per week.   CAD -cardiac CTA coronary calcium score of 121 placing her at the 59th percentile for age, sex, race matched control.  Minimal nonobstructive CAD (1-24%) in LAD, LCx. Continue optimal blood pressure control and lifestyle modifications. Stable with no anginal symptoms. No indication for ischemic evaluation.  GDMT Aspirin, Atenolol, Atorvastatin. Encouraged to continue heart healthy diet.     Disposition: Follow up  after echo 11/2022  with Skeet Latch, MD or APP. Will schedule sooner f/u if necessary based on monitor results.   Signed, Loel Dubonnet, NP 06/12/2022, 10:01 AM [place]

## 2022-06-13 DIAGNOSIS — M9901 Segmental and somatic dysfunction of cervical region: Secondary | ICD-10-CM | POA: Diagnosis not present

## 2022-06-13 DIAGNOSIS — M9905 Segmental and somatic dysfunction of pelvic region: Secondary | ICD-10-CM | POA: Diagnosis not present

## 2022-06-13 DIAGNOSIS — M9903 Segmental and somatic dysfunction of lumbar region: Secondary | ICD-10-CM | POA: Diagnosis not present

## 2022-06-13 DIAGNOSIS — M9902 Segmental and somatic dysfunction of thoracic region: Secondary | ICD-10-CM | POA: Diagnosis not present

## 2022-06-13 LAB — BASIC METABOLIC PANEL
BUN/Creatinine Ratio: 16 (ref 12–28)
BUN: 14 mg/dL (ref 8–27)
CO2: 21 mmol/L (ref 20–29)
Calcium: 10 mg/dL (ref 8.7–10.3)
Chloride: 105 mmol/L (ref 96–106)
Creatinine, Ser: 0.88 mg/dL (ref 0.57–1.00)
Glucose: 113 mg/dL — ABNORMAL HIGH (ref 70–99)
Potassium: 4.4 mmol/L (ref 3.5–5.2)
Sodium: 145 mmol/L — ABNORMAL HIGH (ref 134–144)
eGFR: 68 mL/min/{1.73_m2} (ref >59)

## 2022-06-13 LAB — CBC
Hematocrit: 45.7 % (ref 34.0–46.6)
Hemoglobin: 15.5 g/dL (ref 11.1–15.9)
MCH: 29.8 pg (ref 26.6–33.0)
MCHC: 33.9 g/dL (ref 31.5–35.7)
MCV: 88 fL (ref 79–97)
Platelets: 246 10*3/uL (ref 150–450)
RBC: 5.21 x10E6/uL (ref 3.77–5.28)
RDW: 13.3 % (ref 11.7–15.4)
WBC: 6.9 10*3/uL (ref 3.4–10.8)

## 2022-06-13 LAB — MAGNESIUM: Magnesium: 2.5 mg/dL — ABNORMAL HIGH (ref 1.6–2.3)

## 2022-06-14 DIAGNOSIS — K080 Exfoliation of teeth due to systemic causes: Secondary | ICD-10-CM | POA: Diagnosis not present

## 2022-06-15 ENCOUNTER — Other Ambulatory Visit (HOSPITAL_BASED_OUTPATIENT_CLINIC_OR_DEPARTMENT_OTHER): Payer: Self-pay

## 2022-06-15 DIAGNOSIS — M9901 Segmental and somatic dysfunction of cervical region: Secondary | ICD-10-CM | POA: Diagnosis not present

## 2022-06-15 DIAGNOSIS — E782 Mixed hyperlipidemia: Secondary | ICD-10-CM

## 2022-06-15 DIAGNOSIS — R002 Palpitations: Secondary | ICD-10-CM

## 2022-06-15 DIAGNOSIS — E7800 Pure hypercholesterolemia, unspecified: Secondary | ICD-10-CM

## 2022-06-15 DIAGNOSIS — M9905 Segmental and somatic dysfunction of pelvic region: Secondary | ICD-10-CM | POA: Diagnosis not present

## 2022-06-15 DIAGNOSIS — F4323 Adjustment disorder with mixed anxiety and depressed mood: Secondary | ICD-10-CM | POA: Diagnosis not present

## 2022-06-15 DIAGNOSIS — M9902 Segmental and somatic dysfunction of thoracic region: Secondary | ICD-10-CM | POA: Diagnosis not present

## 2022-06-15 DIAGNOSIS — M9903 Segmental and somatic dysfunction of lumbar region: Secondary | ICD-10-CM | POA: Diagnosis not present

## 2022-06-15 MED ORDER — ATORVASTATIN CALCIUM 10 MG PO TABS: 5.0000 mg | ORAL_TABLET | Freq: Every day | ORAL | 3 refills | Status: DC

## 2022-06-20 DIAGNOSIS — M9901 Segmental and somatic dysfunction of cervical region: Secondary | ICD-10-CM | POA: Diagnosis not present

## 2022-06-20 DIAGNOSIS — M9905 Segmental and somatic dysfunction of pelvic region: Secondary | ICD-10-CM | POA: Diagnosis not present

## 2022-06-20 DIAGNOSIS — M9903 Segmental and somatic dysfunction of lumbar region: Secondary | ICD-10-CM | POA: Diagnosis not present

## 2022-06-20 DIAGNOSIS — M9902 Segmental and somatic dysfunction of thoracic region: Secondary | ICD-10-CM | POA: Diagnosis not present

## 2022-06-22 DIAGNOSIS — M9905 Segmental and somatic dysfunction of pelvic region: Secondary | ICD-10-CM | POA: Diagnosis not present

## 2022-06-22 DIAGNOSIS — M9901 Segmental and somatic dysfunction of cervical region: Secondary | ICD-10-CM | POA: Diagnosis not present

## 2022-06-22 DIAGNOSIS — M9902 Segmental and somatic dysfunction of thoracic region: Secondary | ICD-10-CM | POA: Diagnosis not present

## 2022-06-22 DIAGNOSIS — M9903 Segmental and somatic dysfunction of lumbar region: Secondary | ICD-10-CM | POA: Diagnosis not present

## 2022-06-27 DIAGNOSIS — M9902 Segmental and somatic dysfunction of thoracic region: Secondary | ICD-10-CM | POA: Diagnosis not present

## 2022-06-27 DIAGNOSIS — M9901 Segmental and somatic dysfunction of cervical region: Secondary | ICD-10-CM | POA: Diagnosis not present

## 2022-06-27 DIAGNOSIS — M9903 Segmental and somatic dysfunction of lumbar region: Secondary | ICD-10-CM | POA: Diagnosis not present

## 2022-06-27 DIAGNOSIS — M9905 Segmental and somatic dysfunction of pelvic region: Secondary | ICD-10-CM | POA: Diagnosis not present

## 2022-06-29 DIAGNOSIS — E7800 Pure hypercholesterolemia, unspecified: Secondary | ICD-10-CM | POA: Diagnosis not present

## 2022-06-29 DIAGNOSIS — M9905 Segmental and somatic dysfunction of pelvic region: Secondary | ICD-10-CM | POA: Diagnosis not present

## 2022-06-29 DIAGNOSIS — M9903 Segmental and somatic dysfunction of lumbar region: Secondary | ICD-10-CM | POA: Diagnosis not present

## 2022-06-29 DIAGNOSIS — R002 Palpitations: Secondary | ICD-10-CM | POA: Diagnosis not present

## 2022-06-29 DIAGNOSIS — M9902 Segmental and somatic dysfunction of thoracic region: Secondary | ICD-10-CM | POA: Diagnosis not present

## 2022-06-29 DIAGNOSIS — M9901 Segmental and somatic dysfunction of cervical region: Secondary | ICD-10-CM | POA: Diagnosis not present

## 2022-06-29 DIAGNOSIS — F4323 Adjustment disorder with mixed anxiety and depressed mood: Secondary | ICD-10-CM | POA: Diagnosis not present

## 2022-07-04 DIAGNOSIS — M9903 Segmental and somatic dysfunction of lumbar region: Secondary | ICD-10-CM | POA: Diagnosis not present

## 2022-07-04 DIAGNOSIS — M9901 Segmental and somatic dysfunction of cervical region: Secondary | ICD-10-CM | POA: Diagnosis not present

## 2022-07-04 DIAGNOSIS — M9902 Segmental and somatic dysfunction of thoracic region: Secondary | ICD-10-CM | POA: Diagnosis not present

## 2022-07-04 DIAGNOSIS — M9905 Segmental and somatic dysfunction of pelvic region: Secondary | ICD-10-CM | POA: Diagnosis not present

## 2022-07-06 ENCOUNTER — Encounter (HOSPITAL_BASED_OUTPATIENT_CLINIC_OR_DEPARTMENT_OTHER): Payer: Self-pay

## 2022-07-06 DIAGNOSIS — M9903 Segmental and somatic dysfunction of lumbar region: Secondary | ICD-10-CM | POA: Diagnosis not present

## 2022-07-06 DIAGNOSIS — M9902 Segmental and somatic dysfunction of thoracic region: Secondary | ICD-10-CM | POA: Diagnosis not present

## 2022-07-06 DIAGNOSIS — M9905 Segmental and somatic dysfunction of pelvic region: Secondary | ICD-10-CM | POA: Diagnosis not present

## 2022-07-06 DIAGNOSIS — M9901 Segmental and somatic dysfunction of cervical region: Secondary | ICD-10-CM | POA: Diagnosis not present

## 2022-07-11 DIAGNOSIS — M9903 Segmental and somatic dysfunction of lumbar region: Secondary | ICD-10-CM | POA: Diagnosis not present

## 2022-07-11 DIAGNOSIS — M9901 Segmental and somatic dysfunction of cervical region: Secondary | ICD-10-CM | POA: Diagnosis not present

## 2022-07-11 DIAGNOSIS — M9902 Segmental and somatic dysfunction of thoracic region: Secondary | ICD-10-CM | POA: Diagnosis not present

## 2022-07-11 DIAGNOSIS — M9905 Segmental and somatic dysfunction of pelvic region: Secondary | ICD-10-CM | POA: Diagnosis not present

## 2022-07-13 DIAGNOSIS — M9901 Segmental and somatic dysfunction of cervical region: Secondary | ICD-10-CM | POA: Diagnosis not present

## 2022-07-13 DIAGNOSIS — M9905 Segmental and somatic dysfunction of pelvic region: Secondary | ICD-10-CM | POA: Diagnosis not present

## 2022-07-13 DIAGNOSIS — F4323 Adjustment disorder with mixed anxiety and depressed mood: Secondary | ICD-10-CM | POA: Diagnosis not present

## 2022-07-13 DIAGNOSIS — M9903 Segmental and somatic dysfunction of lumbar region: Secondary | ICD-10-CM | POA: Diagnosis not present

## 2022-07-13 DIAGNOSIS — M9902 Segmental and somatic dysfunction of thoracic region: Secondary | ICD-10-CM | POA: Diagnosis not present

## 2022-07-17 ENCOUNTER — Encounter (HOSPITAL_BASED_OUTPATIENT_CLINIC_OR_DEPARTMENT_OTHER): Payer: Self-pay

## 2022-07-17 DIAGNOSIS — R002 Palpitations: Secondary | ICD-10-CM

## 2022-07-17 NOTE — Telephone Encounter (Signed)
Follow up from recent test results

## 2022-07-18 DIAGNOSIS — M9901 Segmental and somatic dysfunction of cervical region: Secondary | ICD-10-CM | POA: Diagnosis not present

## 2022-07-18 DIAGNOSIS — M9905 Segmental and somatic dysfunction of pelvic region: Secondary | ICD-10-CM | POA: Diagnosis not present

## 2022-07-18 DIAGNOSIS — M9903 Segmental and somatic dysfunction of lumbar region: Secondary | ICD-10-CM | POA: Diagnosis not present

## 2022-07-18 DIAGNOSIS — M9902 Segmental and somatic dysfunction of thoracic region: Secondary | ICD-10-CM | POA: Diagnosis not present

## 2022-07-19 ENCOUNTER — Encounter: Payer: Self-pay | Admitting: Internal Medicine

## 2022-07-19 ENCOUNTER — Ambulatory Visit: Payer: Medicare Other | Attending: Internal Medicine | Admitting: Internal Medicine

## 2022-07-19 VITALS — BP 122/76 | HR 67 | Ht 63.5 in | Wt 125.8 lb

## 2022-07-19 DIAGNOSIS — R002 Palpitations: Secondary | ICD-10-CM | POA: Diagnosis not present

## 2022-07-19 NOTE — Progress Notes (Signed)
ELECTROPHYSIOLOGY CONSULT NOTE  Patient ID: Shannon Brennan, MRN: [id number], DOB/AGE: [birth year] 77 y.o. Admit date: (Not on file) Date of Consult: 07/19/2022  Primary Physician: Prince Solian, MD Primary Cardiologist: TR AILYNE KEHRER is a 77 y.o. female who is being seen today for the evaluation of palpitations at the request of TR .   Chief Complaint: palpitations   HPI Shannon Brennan is a 77 y.o. female with a longstanding history of palpitations that are intermittently problematic.  Not related to caffeine.  Seem to be aggravated by stress.  Has a longstanding history of mild mitral valve prolapse which prompted the initiation of atenolol decades ago.  Seems to have tolerated reasonably well, although it lowers her blood pressure and she doses her atenolol based on her morning blood pressure taking either 6.25 or 12.5 mg.  Inconsistent data suggsting pulmonary hypertension but thought to be negative  DATE TEST EF   8/23 Echo   65 % Mild MVP Normal RV pressures  9/23 CaScore  CaScore 121 Aortic Atherosclerosis         Date Cr K Hgb  2/24 0.88 4.4 15.5           Event recorder was personally reviewed PVC burden less than 1% PAC burden greater than 23% multiple short runs of nonsustained SVT ranging from 4--15 beats and 1 episode of nonsustained ventricular tachycardia  Past Medical History:  Diagnosis Date   Allergic rhinitis    Arthritis    mild in right thumb   Diverticulosis    Essential hypertension 12/01/2021   Family history of cardiovascular disease    Hepatic hemangioma    history of -- followe by Dr. Cristina Gong   Hx of osteopenia    Hyperlipidemia    tolerates low dose Lipitor   Hypertension    controlled with atenolol   Insomnia    Mitral valve prolapse    last echo in 2005 showing mild MVP with normal LV function   Normal nuclear stress test 2006   Palpitations    Pyuria    UTI (urinary tract infection)    Vaginal atrophy        Surgical  History:  Past Surgical History:  Procedure Laterality Date   ABDOMINAL HYSTERECTOMY     LUMBAR LAMINECTOMY/DECOMPRESSION MICRODISCECTOMY  01/04/2012   Procedure: LUMBAR LAMINECTOMY/DECOMPRESSION MICRODISCECTOMY 1 LEVEL;  Surgeon: Kristeen Miss, MD;  Location: [facility];  Service: Neurosurgery;  Laterality: Left;  Left Lumbar Five-Sacral One Microdiskectomy   PARTIAL HYSTERECTOMY     TONSILLECTOMY       Current Meds  Medication Sig   aspirin EC 81 MG tablet Take 1 tablet (81 mg total) by mouth daily. Swallow whole.   atenolol (TENORMIN) 25 MG tablet Take 0.5 tablets (12.5 mg total) by mouth daily.   atorvastatin (LIPITOR) 10 MG tablet Take 0.5 tablets (5 mg total) by mouth daily. 0.5 mg by mouth 5 times a week   Nutritional Supplements (MELATONIN PO) Take 5 mg by mouth at bedtime.   Probiotic Product (ALIGN PO) Take by mouth in the morning and at bedtime. 2 tabs   RESTASIS 0.05 % ophthalmic emulsion 1 drop 2 (two) times daily.   traZODone (DESYREL) 50 MG tablet Take 100 mg by mouth daily.     Allergies:  Allergies  Allergen Reactions   Erythromycin Diarrhea   Flagyl [Metronidazole Hcl] Nausea Only   Levofloxacin Other (See Comments)    Headache   Wellbutrin [Bupropion Hcl] Other (  See Comments)    Dizziness   Zoloft [Sertraline Hcl]     Jittery    Restoril Palpitations    Social History   Socioeconomic History   Marital status: Married    Spouse name: Not on file   Number of children: Not on file   Years of education: Not on file   Highest education level: Not on file  Occupational History   Not on file  Tobacco Use   Smoking status: Never   Smokeless tobacco: Never  Vaping Use   Vaping Use: Never used  Substance and Sexual Activity   Alcohol use: No   Drug use: No   Sexual activity: Never    Birth control/protection: Surgical  Other Topics Concern   Not on file  Social History Narrative   Not on file   Social Determinants of Health   Financial Resource  Strain: Low Risk  (10/21/2020)   Overall Financial Resource Strain (CARDIA)    Difficulty of Paying Living Expenses: Not hard at all  Food Insecurity: No Food Insecurity (10/21/2020)   Hunger Vital Sign    Worried About Running Out of Food in the Last Year: Never true    [place] in the Last Year: Never true  Transportation Needs: No Transportation Needs (10/21/2020)   PRAPARE - Hydrologist (Medical): No    Lack of Transportation (Non-Medical): No  Physical Activity: Sufficiently Active (10/21/2020)   Exercise Vital Sign    Days of Exercise per Week: 5 days    Minutes of Exercise per Session: 30 min  Stress: Stress Concern Present (10/21/2020)   [place]    Feeling of Stress : To some extent  Social Connections: Not on file  Intimate Partner Violence: Not on file     Family History  Problem Relation Age of Onset   Arthritis Father    Cancer Father    Arthritis Mother    Cancer Mother        skin   Hypertension Mother    Stroke Mother    Osteopenia Mother        DDD   Ulcers Mother    Hyperlipidemia Mother    Heart attack Brother    Hypertension Brother    Hypertension Brother    Diabetes Brother    Coronary artery disease Brother        CABG x1   Multiple sclerosis Brother      ROS:  Please see the history of present illness.     All other systems reviewed and negative.    Physical Exam: Blood pressure 122/76, pulse 67, height 5' 3.5" (1.613 m), weight 125 lb 12.8 oz (57.1 kg), SpO2 99 %. General: Well developed, well nourished female in no acute distress. Head: Normocephalic, atraumatic, sclera non-icteric, no xanthomas, nares are without discharge. EENT: normal Lymph Nodes:  none Back: without scoliosis/kyphosis, no CVA tendersness Neck: Negative for carotid bruits. JVD not elevated. Lungs: Clear bilaterally to auscultation without wheezes, rales, or rhonchi.  Breathing is unlabored. Heart: RRR with S1 S2. No  murmur , rubs, or gallops appreciated. Abdomen: Soft, non-tender, non-distended with normoactive bowel sounds. No hepatomegaly. No rebound/guarding. No obvious abdominal masses. Msk:  Strength and tone appear normal for age. Extremities: No clubbing or cyanosis. No  edema.  Distal pedal pulses are 2+ and equal bilaterally. Skin: Warm and Dry Neuro: Alert and oriented X 3. CN III-XII intact Grossly  normal sensory and motor function . Psych:  Responds to questions appropriately with a normal affect.      Labs: Cardiac Enzymes No results for input(s): "CKTOTAL", "CKMB", "TROPONINI" in the last 72 hours. CBC Lab Results  Component Value Date   WBC 6.9 06/12/2022   HGB 15.5 06/12/2022   HCT 45.7 06/12/2022   MCV 88 06/12/2022   PLT 246 06/12/2022   PROTIME: No results for input(s): "LABPROT", "INR" in the last 72 hours. Chemistry No results for input(s): "NA", "K", "CL", "CO2", "BUN", "CREATININE", "CALCIUM", "PROT", "BILITOT", "ALKPHOS", "ALT", "AST", "GLUCOSE" in the last 168 hours.  Invalid input(s): "LABALBU" Lipids Lab Results  Component Value Date   CHOL 189 06/08/2022   HDL 65 06/08/2022   LDLCALC 106 (H) 06/08/2022   TRIG 101 06/08/2022   BNP No results found for: "PROBNP" Thyroid Function Tests: No results for input(s): "TSH", "T4TOTAL", "T3FREE", "THYROIDAB" in the last 72 hours.  Invalid input(s): "FREET3"         EKG: Sinus with frequent PACs mean heart rate of 67 Interval 22/07/40   Assessment and Plan:  PACs-frequent PVCs-rare>> palpitations  Mitral valve prolapse-mild  Life stresses  Coronary artery disease  Hyperlipidemia  The patient has a longstanding history of mitral valve prolapse confirmed by echo with mild.  A initial history of pulmonary hypertension which has not been validated on repeat echoes.  Her major complaint is palpitations and low blood pressure occurring as a consequence of  her atenolol which the doses based on her blood pressure so as to avoid hypotension.  The palpitations are not consistent, on the monitor they were associated with both PACs and PVCs.  Given the relative infrequency, I suggest that 1 strategy would be to discontinue the atenolol and use a as needed calcium blocker or beta-blocker.  An alternative strategy would be to use a 1C agent--at this time we have elected on the former;  obviously a third strategy would be to continue the atenolol  We also discussed her coronary artery disease in the context of her low-dose (best tolerated dose) of statin and her LDL persistently over 100.  We discussed not just the relative benefit but also the absolute benefit.  Estimating a 15-20% 10-year risk, that would be 1.5-2 %/year and a 30% risk reduction becomes 0.5-0.7 per 100/year.  At this   [place]

## 2022-07-19 NOTE — Patient Instructions (Addendum)
Medication Instructions:  Your physician has recommended you make the following change in your medication:   ** Hold Atenolol as discussed by Dr Caryl Comes.  *If you need a refill on your cardiac medications before your next appointment, please call your pharmacy*   Lab Work: None ordered.  If you have labs (blood work) drawn today and your tests are completely normal, you will receive your results only by: [place] (if you have MyChart) OR A paper copy in the mail If you have any lab test that is abnormal or we need to change your treatment, we will call you to review the results.   Testing/Procedures: None ordered.    Follow-Up: At [facility], you and your health needs are our priority.  As part of our continuing mission to provide you with exceptional heart care, we have created designated Provider Care Teams.  These Care Teams include your primary Cardiologist (physician) and Advanced Practice Providers (APPs -  Physician Assistants and Nurse Practitioners) who all work together to provide you with the care you need, when you need it.  We recommend signing up for the patient portal called "MyChart".  Sign up information is provided on this After Visit Summary.  MyChart is used to connect with patients for Virtual Visits (Telemedicine).  Patients are able to view lab/test results, encounter notes, upcoming appointments, etc.  Non-urgent messages can be sent to your provider as well.   To learn more about what you can do with MyChart, go to [email or web address].    Your next appointment:   3 months with Dr Caryl Comes

## 2022-07-20 DIAGNOSIS — M9903 Segmental and somatic dysfunction of lumbar region: Secondary | ICD-10-CM | POA: Diagnosis not present

## 2022-07-20 DIAGNOSIS — M9901 Segmental and somatic dysfunction of cervical region: Secondary | ICD-10-CM | POA: Diagnosis not present

## 2022-07-20 DIAGNOSIS — M9902 Segmental and somatic dysfunction of thoracic region: Secondary | ICD-10-CM | POA: Diagnosis not present

## 2022-07-20 DIAGNOSIS — M9905 Segmental and somatic dysfunction of pelvic region: Secondary | ICD-10-CM | POA: Diagnosis not present

## 2022-07-25 DIAGNOSIS — M9902 Segmental and somatic dysfunction of thoracic region: Secondary | ICD-10-CM | POA: Diagnosis not present

## 2022-07-25 DIAGNOSIS — M9905 Segmental and somatic dysfunction of pelvic region: Secondary | ICD-10-CM | POA: Diagnosis not present

## 2022-07-25 DIAGNOSIS — M9901 Segmental and somatic dysfunction of cervical region: Secondary | ICD-10-CM | POA: Diagnosis not present

## 2022-07-25 DIAGNOSIS — M9903 Segmental and somatic dysfunction of lumbar region: Secondary | ICD-10-CM | POA: Diagnosis not present

## 2022-07-27 DIAGNOSIS — M9903 Segmental and somatic dysfunction of lumbar region: Secondary | ICD-10-CM | POA: Diagnosis not present

## 2022-07-27 DIAGNOSIS — M9901 Segmental and somatic dysfunction of cervical region: Secondary | ICD-10-CM | POA: Diagnosis not present

## 2022-07-27 DIAGNOSIS — M9905 Segmental and somatic dysfunction of pelvic region: Secondary | ICD-10-CM | POA: Diagnosis not present

## 2022-07-27 DIAGNOSIS — F4322 Adjustment disorder with anxiety: Secondary | ICD-10-CM | POA: Diagnosis not present

## 2022-07-27 DIAGNOSIS — M9902 Segmental and somatic dysfunction of thoracic region: Secondary | ICD-10-CM | POA: Diagnosis not present

## 2022-07-28 ENCOUNTER — Encounter (HOSPITAL_BASED_OUTPATIENT_CLINIC_OR_DEPARTMENT_OTHER): Payer: Self-pay | Admitting: Family

## 2022-08-01 DIAGNOSIS — M9902 Segmental and somatic dysfunction of thoracic region: Secondary | ICD-10-CM | POA: Diagnosis not present

## 2022-08-01 DIAGNOSIS — M9901 Segmental and somatic dysfunction of cervical region: Secondary | ICD-10-CM | POA: Diagnosis not present

## 2022-08-01 DIAGNOSIS — M9905 Segmental and somatic dysfunction of pelvic region: Secondary | ICD-10-CM | POA: Diagnosis not present

## 2022-08-01 DIAGNOSIS — M9903 Segmental and somatic dysfunction of lumbar region: Secondary | ICD-10-CM | POA: Diagnosis not present

## 2022-08-03 DIAGNOSIS — M9901 Segmental and somatic dysfunction of cervical region: Secondary | ICD-10-CM | POA: Diagnosis not present

## 2022-08-03 DIAGNOSIS — M9905 Segmental and somatic dysfunction of pelvic region: Secondary | ICD-10-CM | POA: Diagnosis not present

## 2022-08-03 DIAGNOSIS — M9902 Segmental and somatic dysfunction of thoracic region: Secondary | ICD-10-CM | POA: Diagnosis not present

## 2022-08-03 DIAGNOSIS — M9903 Segmental and somatic dysfunction of lumbar region: Secondary | ICD-10-CM | POA: Diagnosis not present

## 2022-08-07 ENCOUNTER — Encounter: Payer: Self-pay | Admitting: Internal Medicine

## 2022-08-07 DIAGNOSIS — Z79899 Other long term (current) drug therapy: Secondary | ICD-10-CM

## 2022-08-07 DIAGNOSIS — R002 Palpitations: Secondary | ICD-10-CM

## 2022-08-07 DIAGNOSIS — I493 Ventricular premature depolarization: Secondary | ICD-10-CM

## 2022-08-08 MED ORDER — FLECAINIDE ACETATE 50 MG PO TABS: 25.0000 mg | ORAL_TABLET | Freq: Two times a day (BID) | ORAL | 3 refills | Status: DC

## 2022-08-10 DIAGNOSIS — K529 Noninfective gastroenteritis and colitis, unspecified: Secondary | ICD-10-CM | POA: Diagnosis not present

## 2022-08-10 DIAGNOSIS — I491 Atrial premature depolarization: Secondary | ICD-10-CM | POA: Diagnosis not present

## 2022-08-14 NOTE — Addendum Note (Signed)
Addended by: Alois Cliche on: 08/14/2022 08:11 AM   Modules accepted: Orders

## 2022-08-15 DIAGNOSIS — M9903 Segmental and somatic dysfunction of lumbar region: Secondary | ICD-10-CM | POA: Diagnosis not present

## 2022-08-15 DIAGNOSIS — F4323 Adjustment disorder with mixed anxiety and depressed mood: Secondary | ICD-10-CM | POA: Diagnosis not present

## 2022-08-15 DIAGNOSIS — M9905 Segmental and somatic dysfunction of pelvic region: Secondary | ICD-10-CM | POA: Diagnosis not present

## 2022-08-15 DIAGNOSIS — M9901 Segmental and somatic dysfunction of cervical region: Secondary | ICD-10-CM | POA: Diagnosis not present

## 2022-08-15 DIAGNOSIS — M9902 Segmental and somatic dysfunction of thoracic region: Secondary | ICD-10-CM | POA: Diagnosis not present

## 2022-08-17 DIAGNOSIS — M9902 Segmental and somatic dysfunction of thoracic region: Secondary | ICD-10-CM | POA: Diagnosis not present

## 2022-08-17 DIAGNOSIS — M9903 Segmental and somatic dysfunction of lumbar region: Secondary | ICD-10-CM | POA: Diagnosis not present

## 2022-08-17 DIAGNOSIS — M9901 Segmental and somatic dysfunction of cervical region: Secondary | ICD-10-CM | POA: Diagnosis not present

## 2022-08-17 DIAGNOSIS — M9905 Segmental and somatic dysfunction of pelvic region: Secondary | ICD-10-CM | POA: Diagnosis not present

## 2022-08-22 DIAGNOSIS — M9905 Segmental and somatic dysfunction of pelvic region: Secondary | ICD-10-CM | POA: Diagnosis not present

## 2022-08-22 DIAGNOSIS — M9903 Segmental and somatic dysfunction of lumbar region: Secondary | ICD-10-CM | POA: Diagnosis not present

## 2022-08-22 DIAGNOSIS — M9902 Segmental and somatic dysfunction of thoracic region: Secondary | ICD-10-CM | POA: Diagnosis not present

## 2022-08-22 DIAGNOSIS — M9901 Segmental and somatic dysfunction of cervical region: Secondary | ICD-10-CM | POA: Diagnosis not present

## 2022-08-24 DIAGNOSIS — M9901 Segmental and somatic dysfunction of cervical region: Secondary | ICD-10-CM | POA: Diagnosis not present

## 2022-08-24 DIAGNOSIS — M9905 Segmental and somatic dysfunction of pelvic region: Secondary | ICD-10-CM | POA: Diagnosis not present

## 2022-08-24 DIAGNOSIS — M9903 Segmental and somatic dysfunction of lumbar region: Secondary | ICD-10-CM | POA: Diagnosis not present

## 2022-08-24 DIAGNOSIS — M9902 Segmental and somatic dysfunction of thoracic region: Secondary | ICD-10-CM | POA: Diagnosis not present

## 2022-08-24 DIAGNOSIS — F4323 Adjustment disorder with mixed anxiety and depressed mood: Secondary | ICD-10-CM | POA: Diagnosis not present

## 2022-08-29 DIAGNOSIS — M9901 Segmental and somatic dysfunction of cervical region: Secondary | ICD-10-CM | POA: Diagnosis not present

## 2022-08-29 DIAGNOSIS — M9903 Segmental and somatic dysfunction of lumbar region: Secondary | ICD-10-CM | POA: Diagnosis not present

## 2022-08-29 DIAGNOSIS — M9902 Segmental and somatic dysfunction of thoracic region: Secondary | ICD-10-CM | POA: Diagnosis not present

## 2022-08-29 DIAGNOSIS — M9905 Segmental and somatic dysfunction of pelvic region: Secondary | ICD-10-CM | POA: Diagnosis not present

## 2022-08-31 DIAGNOSIS — M9905 Segmental and somatic dysfunction of pelvic region: Secondary | ICD-10-CM | POA: Diagnosis not present

## 2022-08-31 DIAGNOSIS — M9902 Segmental and somatic dysfunction of thoracic region: Secondary | ICD-10-CM | POA: Diagnosis not present

## 2022-08-31 DIAGNOSIS — M9903 Segmental and somatic dysfunction of lumbar region: Secondary | ICD-10-CM | POA: Diagnosis not present

## 2022-08-31 DIAGNOSIS — M9901 Segmental and somatic dysfunction of cervical region: Secondary | ICD-10-CM | POA: Diagnosis not present

## 2022-09-01 DIAGNOSIS — I493 Ventricular premature depolarization: Secondary | ICD-10-CM | POA: Insufficient documentation

## 2022-09-01 DIAGNOSIS — I491 Atrial premature depolarization: Secondary | ICD-10-CM | POA: Insufficient documentation

## 2022-09-04 ENCOUNTER — Encounter: Payer: Self-pay | Admitting: Internal Medicine

## 2022-09-04 DIAGNOSIS — Z01419 Encounter for gynecological examination (general) (routine) without abnormal findings: Secondary | ICD-10-CM | POA: Diagnosis not present

## 2022-09-05 ENCOUNTER — Ambulatory Visit: Payer: Medicare Other | Attending: Internal Medicine

## 2022-09-05 ENCOUNTER — Ambulatory Visit: Payer: Medicare Other | Admitting: Internal Medicine

## 2022-09-05 DIAGNOSIS — M9903 Segmental and somatic dysfunction of lumbar region: Secondary | ICD-10-CM | POA: Diagnosis not present

## 2022-09-05 DIAGNOSIS — R002 Palpitations: Secondary | ICD-10-CM | POA: Diagnosis not present

## 2022-09-05 DIAGNOSIS — Z79899 Other long term (current) drug therapy: Secondary | ICD-10-CM

## 2022-09-05 DIAGNOSIS — I493 Ventricular premature depolarization: Secondary | ICD-10-CM

## 2022-09-05 DIAGNOSIS — M9905 Segmental and somatic dysfunction of pelvic region: Secondary | ICD-10-CM | POA: Diagnosis not present

## 2022-09-05 DIAGNOSIS — I491 Atrial premature depolarization: Secondary | ICD-10-CM

## 2022-09-05 DIAGNOSIS — M9901 Segmental and somatic dysfunction of cervical region: Secondary | ICD-10-CM | POA: Diagnosis not present

## 2022-09-05 DIAGNOSIS — M9902 Segmental and somatic dysfunction of thoracic region: Secondary | ICD-10-CM | POA: Diagnosis not present

## 2022-09-05 LAB — EXERCISE TOLERANCE TEST
Angina Index: 0
Estimated workload: 6.2
Exercise duration (min): 2 min
Exercise duration (sec): 31 s
MPHR: 143 {beats}/min
Peak HR: 139 {beats}/min
Percent HR: 97 %
Rest HR: 81 {beats}/min

## 2022-09-06 NOTE — Addendum Note (Signed)
Addended by: Duke Salvia on: 09/06/2022 10:59 AM   Modules accepted: Orders

## 2022-09-07 ENCOUNTER — Encounter: Payer: Self-pay | Admitting: Internal Medicine

## 2022-09-07 DIAGNOSIS — M9902 Segmental and somatic dysfunction of thoracic region: Secondary | ICD-10-CM | POA: Diagnosis not present

## 2022-09-07 DIAGNOSIS — M9903 Segmental and somatic dysfunction of lumbar region: Secondary | ICD-10-CM | POA: Diagnosis not present

## 2022-09-07 DIAGNOSIS — M9905 Segmental and somatic dysfunction of pelvic region: Secondary | ICD-10-CM | POA: Diagnosis not present

## 2022-09-07 DIAGNOSIS — F4323 Adjustment disorder with mixed anxiety and depressed mood: Secondary | ICD-10-CM | POA: Diagnosis not present

## 2022-09-07 DIAGNOSIS — M9901 Segmental and somatic dysfunction of cervical region: Secondary | ICD-10-CM | POA: Diagnosis not present

## 2022-09-12 DIAGNOSIS — M9901 Segmental and somatic dysfunction of cervical region: Secondary | ICD-10-CM | POA: Diagnosis not present

## 2022-09-12 DIAGNOSIS — M9905 Segmental and somatic dysfunction of pelvic region: Secondary | ICD-10-CM | POA: Diagnosis not present

## 2022-09-12 DIAGNOSIS — M9903 Segmental and somatic dysfunction of lumbar region: Secondary | ICD-10-CM | POA: Diagnosis not present

## 2022-09-12 DIAGNOSIS — M9902 Segmental and somatic dysfunction of thoracic region: Secondary | ICD-10-CM | POA: Diagnosis not present

## 2022-09-14 DIAGNOSIS — M9901 Segmental and somatic dysfunction of cervical region: Secondary | ICD-10-CM | POA: Diagnosis not present

## 2022-09-14 DIAGNOSIS — M9903 Segmental and somatic dysfunction of lumbar region: Secondary | ICD-10-CM | POA: Diagnosis not present

## 2022-09-14 DIAGNOSIS — M9905 Segmental and somatic dysfunction of pelvic region: Secondary | ICD-10-CM | POA: Diagnosis not present

## 2022-09-14 DIAGNOSIS — M9902 Segmental and somatic dysfunction of thoracic region: Secondary | ICD-10-CM | POA: Diagnosis not present

## 2022-09-19 DIAGNOSIS — M9903 Segmental and somatic dysfunction of lumbar region: Secondary | ICD-10-CM | POA: Diagnosis not present

## 2022-09-19 DIAGNOSIS — M9905 Segmental and somatic dysfunction of pelvic region: Secondary | ICD-10-CM | POA: Diagnosis not present

## 2022-09-19 DIAGNOSIS — M9901 Segmental and somatic dysfunction of cervical region: Secondary | ICD-10-CM | POA: Diagnosis not present

## 2022-09-19 DIAGNOSIS — M9902 Segmental and somatic dysfunction of thoracic region: Secondary | ICD-10-CM | POA: Diagnosis not present

## 2022-09-21 DIAGNOSIS — F4323 Adjustment disorder with mixed anxiety and depressed mood: Secondary | ICD-10-CM | POA: Diagnosis not present

## 2022-09-21 DIAGNOSIS — M9905 Segmental and somatic dysfunction of pelvic region: Secondary | ICD-10-CM | POA: Diagnosis not present

## 2022-09-21 DIAGNOSIS — M9902 Segmental and somatic dysfunction of thoracic region: Secondary | ICD-10-CM | POA: Diagnosis not present

## 2022-09-21 DIAGNOSIS — M9901 Segmental and somatic dysfunction of cervical region: Secondary | ICD-10-CM | POA: Diagnosis not present

## 2022-09-21 DIAGNOSIS — M9903 Segmental and somatic dysfunction of lumbar region: Secondary | ICD-10-CM | POA: Diagnosis not present

## 2022-09-26 DIAGNOSIS — M9902 Segmental and somatic dysfunction of thoracic region: Secondary | ICD-10-CM | POA: Diagnosis not present

## 2022-09-26 DIAGNOSIS — M9901 Segmental and somatic dysfunction of cervical region: Secondary | ICD-10-CM | POA: Diagnosis not present

## 2022-09-26 DIAGNOSIS — M9905 Segmental and somatic dysfunction of pelvic region: Secondary | ICD-10-CM | POA: Diagnosis not present

## 2022-09-26 DIAGNOSIS — M9903 Segmental and somatic dysfunction of lumbar region: Secondary | ICD-10-CM | POA: Diagnosis not present

## 2022-09-28 DIAGNOSIS — M9902 Segmental and somatic dysfunction of thoracic region: Secondary | ICD-10-CM | POA: Diagnosis not present

## 2022-09-28 DIAGNOSIS — M9901 Segmental and somatic dysfunction of cervical region: Secondary | ICD-10-CM | POA: Diagnosis not present

## 2022-09-28 DIAGNOSIS — M9905 Segmental and somatic dysfunction of pelvic region: Secondary | ICD-10-CM | POA: Diagnosis not present

## 2022-09-28 DIAGNOSIS — M9903 Segmental and somatic dysfunction of lumbar region: Secondary | ICD-10-CM | POA: Diagnosis not present

## 2022-10-03 DIAGNOSIS — M9905 Segmental and somatic dysfunction of pelvic region: Secondary | ICD-10-CM | POA: Diagnosis not present

## 2022-10-03 DIAGNOSIS — M9901 Segmental and somatic dysfunction of cervical region: Secondary | ICD-10-CM | POA: Diagnosis not present

## 2022-10-03 DIAGNOSIS — M9903 Segmental and somatic dysfunction of lumbar region: Secondary | ICD-10-CM | POA: Diagnosis not present

## 2022-10-03 DIAGNOSIS — M9902 Segmental and somatic dysfunction of thoracic region: Secondary | ICD-10-CM | POA: Diagnosis not present

## 2022-10-05 DIAGNOSIS — M9901 Segmental and somatic dysfunction of cervical region: Secondary | ICD-10-CM | POA: Diagnosis not present

## 2022-10-05 DIAGNOSIS — F4323 Adjustment disorder with mixed anxiety and depressed mood: Secondary | ICD-10-CM | POA: Diagnosis not present

## 2022-10-05 DIAGNOSIS — M9903 Segmental and somatic dysfunction of lumbar region: Secondary | ICD-10-CM | POA: Diagnosis not present

## 2022-10-05 DIAGNOSIS — M9905 Segmental and somatic dysfunction of pelvic region: Secondary | ICD-10-CM | POA: Diagnosis not present

## 2022-10-05 DIAGNOSIS — M9902 Segmental and somatic dysfunction of thoracic region: Secondary | ICD-10-CM | POA: Diagnosis not present

## 2022-10-10 DIAGNOSIS — M9902 Segmental and somatic dysfunction of thoracic region: Secondary | ICD-10-CM | POA: Diagnosis not present

## 2022-10-10 DIAGNOSIS — M9901 Segmental and somatic dysfunction of cervical region: Secondary | ICD-10-CM | POA: Diagnosis not present

## 2022-10-10 DIAGNOSIS — M9905 Segmental and somatic dysfunction of pelvic region: Secondary | ICD-10-CM | POA: Diagnosis not present

## 2022-10-10 DIAGNOSIS — M9903 Segmental and somatic dysfunction of lumbar region: Secondary | ICD-10-CM | POA: Diagnosis not present

## 2022-10-11 DIAGNOSIS — M810 Age-related osteoporosis without current pathological fracture: Secondary | ICD-10-CM | POA: Diagnosis not present

## 2022-10-11 DIAGNOSIS — E785 Hyperlipidemia, unspecified: Secondary | ICD-10-CM | POA: Diagnosis not present

## 2022-10-12 DIAGNOSIS — M9905 Segmental and somatic dysfunction of pelvic region: Secondary | ICD-10-CM | POA: Diagnosis not present

## 2022-10-12 DIAGNOSIS — M9901 Segmental and somatic dysfunction of cervical region: Secondary | ICD-10-CM | POA: Diagnosis not present

## 2022-10-12 DIAGNOSIS — M9903 Segmental and somatic dysfunction of lumbar region: Secondary | ICD-10-CM | POA: Diagnosis not present

## 2022-10-12 DIAGNOSIS — M9902 Segmental and somatic dysfunction of thoracic region: Secondary | ICD-10-CM | POA: Diagnosis not present

## 2022-10-17 DIAGNOSIS — M9905 Segmental and somatic dysfunction of pelvic region: Secondary | ICD-10-CM | POA: Diagnosis not present

## 2022-10-17 DIAGNOSIS — M9903 Segmental and somatic dysfunction of lumbar region: Secondary | ICD-10-CM | POA: Diagnosis not present

## 2022-10-17 DIAGNOSIS — M9902 Segmental and somatic dysfunction of thoracic region: Secondary | ICD-10-CM | POA: Diagnosis not present

## 2022-10-17 DIAGNOSIS — M9901 Segmental and somatic dysfunction of cervical region: Secondary | ICD-10-CM | POA: Diagnosis not present

## 2022-10-18 DIAGNOSIS — R82998 Other abnormal findings in urine: Secondary | ICD-10-CM | POA: Diagnosis not present

## 2022-10-18 DIAGNOSIS — Z1331 Encounter for screening for depression: Secondary | ICD-10-CM | POA: Diagnosis not present

## 2022-10-18 DIAGNOSIS — Z0000 Encounter for general adult medical examination without abnormal findings: Secondary | ICD-10-CM | POA: Diagnosis not present

## 2022-10-18 DIAGNOSIS — Z1212 Encounter for screening for malignant neoplasm of rectum: Secondary | ICD-10-CM | POA: Diagnosis not present

## 2022-10-18 DIAGNOSIS — Z1339 Encounter for screening examination for other mental health and behavioral disorders: Secondary | ICD-10-CM | POA: Diagnosis not present

## 2022-10-18 DIAGNOSIS — I700 Atherosclerosis of aorta: Secondary | ICD-10-CM | POA: Diagnosis not present

## 2022-10-19 ENCOUNTER — Ambulatory Visit: Payer: Medicare Other | Attending: Internal Medicine | Admitting: Internal Medicine

## 2022-10-19 ENCOUNTER — Encounter: Payer: Self-pay | Admitting: Internal Medicine

## 2022-10-19 ENCOUNTER — Other Ambulatory Visit: Payer: Self-pay

## 2022-10-19 VITALS — BP 110/82 | HR 81 | Ht 63.5 in | Wt 126.2 lb

## 2022-10-19 DIAGNOSIS — M9902 Segmental and somatic dysfunction of thoracic region: Secondary | ICD-10-CM | POA: Diagnosis not present

## 2022-10-19 DIAGNOSIS — M9903 Segmental and somatic dysfunction of lumbar region: Secondary | ICD-10-CM | POA: Diagnosis not present

## 2022-10-19 DIAGNOSIS — R002 Palpitations: Secondary | ICD-10-CM | POA: Diagnosis not present

## 2022-10-19 DIAGNOSIS — I493 Ventricular premature depolarization: Secondary | ICD-10-CM | POA: Diagnosis not present

## 2022-10-19 DIAGNOSIS — M9901 Segmental and somatic dysfunction of cervical region: Secondary | ICD-10-CM | POA: Diagnosis not present

## 2022-10-19 DIAGNOSIS — M9905 Segmental and somatic dysfunction of pelvic region: Secondary | ICD-10-CM | POA: Diagnosis not present

## 2022-10-19 MED ORDER — DILTIAZEM HCL 30 MG PO TABS: 30.0000 mg | ORAL_TABLET | Freq: Every day | ORAL | 0 refills | Status: DC | PRN

## 2022-10-19 MED ORDER — DILTIAZEM HCL 30 MG PO TABS: 30.0000 mg | ORAL_TABLET | ORAL | 0 refills | Status: DC | PRN

## 2022-10-19 NOTE — Patient Instructions (Signed)
Medication Instructions:  STOP Flecainide  Start Verapamil 40 mg  *If you need a refill on your cardiac medications before your next appointment, please call your pharmacy*   Follow-Up: At [facility], you and your health needs are our priority.  As part of our continuing mission to provide you with exceptional heart care, we have created designated Provider Care Teams.  These Care Teams include your primary Cardiologist (physician) and Advanced Practice Providers (APPs -  Physician Assistants and Nurse Practitioners) who all work together to provide you with the care you need, when you need it.  We recommend signing up for the patient portal called "MyChart".  Sign up information is provided on this After Visit Summary.  MyChart is used to connect with patients for Virtual Visits (Telemedicine).  Patients are able to view lab/test results, encounter notes, upcoming appointments, etc.  Non-urgent messages can be sent to your provider as well.   To learn more about what you can do with MyChart, go to [email or web address].    Your next appointment:   4 week(s) - tele visit   Provider:   Sherryl Manges, MD

## 2022-10-19 NOTE — Progress Notes (Signed)
Patient Care Team: Chilton Greathouse, MD as PCP - General (Internal Medicine) Chilton Si, MD as PCP - Cardiology (Cardiology)   HPI  Shannon Brennan is a 77 y.o. female seen in in follow-up for palpitations both PACs and PVCs.  Relatively infrequent, and we plan to discontinue the atenolol at the last visit and put her on as needed calcium blockers.  This was insufficient and we ended up putting her on flecainide at low-dose 25 mg twice daily.  Unfortunately, concurrent with this initiation has been significant alopecia.  Significant with this also has been more of her ambient significant amounts of stress.  Dr. Vassie Loll put her on BuSpar yesterday.   Records and Results Reviewed   Past Medical History:  Diagnosis Date   Allergic rhinitis    Arthritis    mild in right thumb   Diverticulosis    Essential hypertension 12/01/2021   Family history of cardiovascular disease    Hepatic hemangioma    history of -- followe by Dr. Matthias Hughs   Hx of osteopenia    Hyperlipidemia    tolerates low dose Lipitor   Hypertension    controlled with atenolol   Insomnia    Mitral valve prolapse    last echo in 2005 showing mild MVP with normal LV function   Normal nuclear stress test 2006   Palpitations    Pyuria    UTI (urinary tract infection)    Vaginal atrophy     Past Surgical History:  Procedure Laterality Date   ABDOMINAL HYSTERECTOMY     LUMBAR LAMINECTOMY/DECOMPRESSION MICRODISCECTOMY  01/04/2012   Procedure: LUMBAR LAMINECTOMY/DECOMPRESSION MICRODISCECTOMY 1 LEVEL;  Surgeon: Barnett Abu, MD;  Location: [facility];  Service: Neurosurgery;  Laterality: Left;  Left Lumbar Five-Sacral One Microdiskectomy   PARTIAL HYSTERECTOMY     TONSILLECTOMY      Current Meds  Medication Sig   aspirin EC 81 MG tablet Take 1 tablet (81 mg total) by mouth daily. Swallow whole.   atorvastatin (LIPITOR) 10 MG tablet Take 0.5 tablets (5 mg total) by mouth daily. 0.5 mg by mouth 5 times a  week (Patient taking differently: Take 5 mg by mouth daily. 0.5 mg by mouth 7 times a week)   busPIRone (BUSPAR) 7.5 MG tablet Take 7.5 mg by mouth 2 (two) times daily.   flecainide (TAMBOCOR) 50 MG tablet Take 0.5 tablets (25 mg total) by mouth 2 (two) times daily.   Nutritional Supplements (MELATONIN PO) Take 5 mg by mouth at bedtime.   Probiotic Product (ALIGN PO) Take by mouth in the morning and at bedtime. 2 tabs   RESTASIS 0.05 % ophthalmic emulsion 1 drop 2 (two) times daily.   traZODone (DESYREL) 50 MG tablet Take 100 mg by mouth daily.    Allergies  Allergen Reactions   Erythromycin Diarrhea   Flagyl [Metronidazole Hcl] Nausea Only   Levofloxacin Other (See Comments)    Headache   Wellbutrin [Bupropion Hcl] Other (See Comments)    Dizziness   Zoloft [Sertraline Hcl]     Jittery    Restoril Palpitations      Review of Systems negative except from HPI and PMH  Physical Exam BP 110/82   Pulse 81   Ht 5' 3.5" (1.613 m)   Wt 126 lb 3.2 oz (57.2 kg)   SpO2 99%   BMI 22.00 kg/m  Well developed and well nourished in no acute distress HENT normal E scleral and icterus clear Neck  Supple JVP flat; carotids brisk and full Clear to ausculation Regular rate and rhythm, no murmurs gallops or rub Soft with active bowel sounds No clubbing cyanosis  Edema Alert and oriented, grossly normal motor and sensory function Skin Warm and Dry  ECG sinus @ 81 20/09/39  CrCl cannot be calculated (Patient's most recent lab result is older than the maximum 21 days allowed.).   Assessment and  Plan  PACs-frequent PVCs-rare>> palpitations   Mitral valve prolapse-mild   Life stresses   Coronary artery disease   Hyperlipidemia  Alopecia    Patient has significant improvement in her palpitations with flecainide, however, this has concurrently been associated with significant alopecia.  Reported infrequently.  We will hold the flecainide for 3-4 weeks and see what happens.   Will use diltiazem 30 as a as needed to take and then anticipate if the alopecia improves to use propafenone for her palpitations.3   Current medicines are reviewed at length with the patient today .  The patient does not  have concerns regarding medicines.

## 2022-10-24 DIAGNOSIS — M9905 Segmental and somatic dysfunction of pelvic region: Secondary | ICD-10-CM | POA: Diagnosis not present

## 2022-10-24 DIAGNOSIS — M9901 Segmental and somatic dysfunction of cervical region: Secondary | ICD-10-CM | POA: Diagnosis not present

## 2022-10-24 DIAGNOSIS — M9903 Segmental and somatic dysfunction of lumbar region: Secondary | ICD-10-CM | POA: Diagnosis not present

## 2022-10-24 DIAGNOSIS — M9902 Segmental and somatic dysfunction of thoracic region: Secondary | ICD-10-CM | POA: Diagnosis not present

## 2022-10-26 DIAGNOSIS — M9905 Segmental and somatic dysfunction of pelvic region: Secondary | ICD-10-CM | POA: Diagnosis not present

## 2022-10-26 DIAGNOSIS — M9901 Segmental and somatic dysfunction of cervical region: Secondary | ICD-10-CM | POA: Diagnosis not present

## 2022-10-26 DIAGNOSIS — M9903 Segmental and somatic dysfunction of lumbar region: Secondary | ICD-10-CM | POA: Diagnosis not present

## 2022-10-26 DIAGNOSIS — M9902 Segmental and somatic dysfunction of thoracic region: Secondary | ICD-10-CM | POA: Diagnosis not present

## 2022-10-30 DIAGNOSIS — M810 Age-related osteoporosis without current pathological fracture: Secondary | ICD-10-CM | POA: Diagnosis not present

## 2022-10-31 DIAGNOSIS — M9903 Segmental and somatic dysfunction of lumbar region: Secondary | ICD-10-CM | POA: Diagnosis not present

## 2022-10-31 DIAGNOSIS — M9901 Segmental and somatic dysfunction of cervical region: Secondary | ICD-10-CM | POA: Diagnosis not present

## 2022-10-31 DIAGNOSIS — M9902 Segmental and somatic dysfunction of thoracic region: Secondary | ICD-10-CM | POA: Diagnosis not present

## 2022-10-31 DIAGNOSIS — M9905 Segmental and somatic dysfunction of pelvic region: Secondary | ICD-10-CM | POA: Diagnosis not present

## 2022-11-02 DIAGNOSIS — F4323 Adjustment disorder with mixed anxiety and depressed mood: Secondary | ICD-10-CM | POA: Diagnosis not present

## 2022-11-07 DIAGNOSIS — M9902 Segmental and somatic dysfunction of thoracic region: Secondary | ICD-10-CM | POA: Diagnosis not present

## 2022-11-07 DIAGNOSIS — M9905 Segmental and somatic dysfunction of pelvic region: Secondary | ICD-10-CM | POA: Diagnosis not present

## 2022-11-07 DIAGNOSIS — M9901 Segmental and somatic dysfunction of cervical region: Secondary | ICD-10-CM | POA: Diagnosis not present

## 2022-11-07 DIAGNOSIS — M9903 Segmental and somatic dysfunction of lumbar region: Secondary | ICD-10-CM | POA: Diagnosis not present

## 2022-11-09 DIAGNOSIS — M9905 Segmental and somatic dysfunction of pelvic region: Secondary | ICD-10-CM | POA: Diagnosis not present

## 2022-11-09 DIAGNOSIS — M9902 Segmental and somatic dysfunction of thoracic region: Secondary | ICD-10-CM | POA: Diagnosis not present

## 2022-11-09 DIAGNOSIS — M9903 Segmental and somatic dysfunction of lumbar region: Secondary | ICD-10-CM | POA: Diagnosis not present

## 2022-11-09 DIAGNOSIS — M9901 Segmental and somatic dysfunction of cervical region: Secondary | ICD-10-CM | POA: Diagnosis not present

## 2022-11-14 DIAGNOSIS — M9902 Segmental and somatic dysfunction of thoracic region: Secondary | ICD-10-CM | POA: Diagnosis not present

## 2022-11-14 DIAGNOSIS — M9905 Segmental and somatic dysfunction of pelvic region: Secondary | ICD-10-CM | POA: Diagnosis not present

## 2022-11-14 DIAGNOSIS — M9903 Segmental and somatic dysfunction of lumbar region: Secondary | ICD-10-CM | POA: Diagnosis not present

## 2022-11-14 DIAGNOSIS — M9901 Segmental and somatic dysfunction of cervical region: Secondary | ICD-10-CM | POA: Diagnosis not present

## 2022-11-16 DIAGNOSIS — M9901 Segmental and somatic dysfunction of cervical region: Secondary | ICD-10-CM | POA: Diagnosis not present

## 2022-11-16 DIAGNOSIS — M9903 Segmental and somatic dysfunction of lumbar region: Secondary | ICD-10-CM | POA: Diagnosis not present

## 2022-11-16 DIAGNOSIS — M9905 Segmental and somatic dysfunction of pelvic region: Secondary | ICD-10-CM | POA: Diagnosis not present

## 2022-11-16 DIAGNOSIS — M9902 Segmental and somatic dysfunction of thoracic region: Secondary | ICD-10-CM | POA: Diagnosis not present

## 2022-11-16 DIAGNOSIS — F4323 Adjustment disorder with mixed anxiety and depressed mood: Secondary | ICD-10-CM | POA: Diagnosis not present

## 2022-11-21 DIAGNOSIS — M9902 Segmental and somatic dysfunction of thoracic region: Secondary | ICD-10-CM | POA: Diagnosis not present

## 2022-11-21 DIAGNOSIS — M9903 Segmental and somatic dysfunction of lumbar region: Secondary | ICD-10-CM | POA: Diagnosis not present

## 2022-11-21 DIAGNOSIS — M9901 Segmental and somatic dysfunction of cervical region: Secondary | ICD-10-CM | POA: Diagnosis not present

## 2022-11-21 DIAGNOSIS — M9905 Segmental and somatic dysfunction of pelvic region: Secondary | ICD-10-CM | POA: Diagnosis not present

## 2022-11-28 ENCOUNTER — Ambulatory Visit: Payer: Medicare Other | Attending: Internal Medicine | Admitting: Internal Medicine

## 2022-11-28 VITALS — BP 116/65 | HR 81 | Ht 63.5 in | Wt 124.0 lb

## 2022-11-28 DIAGNOSIS — M9903 Segmental and somatic dysfunction of lumbar region: Secondary | ICD-10-CM | POA: Diagnosis not present

## 2022-11-28 DIAGNOSIS — I491 Atrial premature depolarization: Secondary | ICD-10-CM

## 2022-11-28 DIAGNOSIS — M9902 Segmental and somatic dysfunction of thoracic region: Secondary | ICD-10-CM | POA: Diagnosis not present

## 2022-11-28 DIAGNOSIS — M9901 Segmental and somatic dysfunction of cervical region: Secondary | ICD-10-CM | POA: Diagnosis not present

## 2022-11-28 DIAGNOSIS — I493 Ventricular premature depolarization: Secondary | ICD-10-CM | POA: Diagnosis not present

## 2022-11-28 DIAGNOSIS — M9905 Segmental and somatic dysfunction of pelvic region: Secondary | ICD-10-CM | POA: Diagnosis not present

## 2022-11-28 DIAGNOSIS — Z1231 Encounter for screening mammogram for malignant neoplasm of breast: Secondary | ICD-10-CM | POA: Diagnosis not present

## 2022-11-28 NOTE — Progress Notes (Signed)
Electrophysiology TeleHealth Note      Date:  11/28/2022   ID:  Shannon Brennan, Shannon Brennan [birth year], MRN [id number]  Location: patient's home  Provider location: [address], [place]  Evaluation Performed: Follow-up visit  PCP:  Chilton Greathouse, MD  Cardiologist:    Electrophysiologist:  SK   Chief Complaint:  palpitations  History of Present Illness:    Shannon Brennan is a 77 y.o. female who presents via audio conferencing for a telehealth visit today.  Since last being seen in our clinic for palpitations related to PACs and PVCs initially treated with beta-blockers and then as needed calcium blockers which was insufficient, flecainide added but concurrent with this was a development of alopecia but also significant amount of psychosocial stress, and elected to hold the flecainide to see how she fared the patient reports alopecia improved   Took dose of diltiazem prn with "chest pressure," with lower BP; perhaps more arrhythmia since also heart "zaps,"   9/23-- CT--calcium score 121 Echo 8/23 >> normal LV function   Past Medical History:  Diagnosis Date   Allergic rhinitis    Arthritis    mild in right thumb   Diverticulosis    Essential hypertension 12/01/2021   Family history of cardiovascular disease    Hepatic hemangioma    history of -- followe by Dr. Matthias Hughs   Hx of osteopenia    Hyperlipidemia    tolerates low dose Lipitor   Hypertension    controlled with atenolol   Insomnia    Mitral valve prolapse    last echo in 2005 showing mild MVP with normal LV function   Normal nuclear stress test 2006   Palpitations    Pyuria    UTI (urinary tract infection)    Vaginal atrophy     Past Surgical History:  Procedure Laterality Date   ABDOMINAL HYSTERECTOMY     LUMBAR LAMINECTOMY/DECOMPRESSION MICRODISCECTOMY  01/04/2012   Procedure: LUMBAR LAMINECTOMY/DECOMPRESSION MICRODISCECTOMY 1 LEVEL;  Surgeon: Barnett Abu, MD;  Location: [facility];   Service: Neurosurgery;  Laterality: Left;  Left Lumbar Five-Sacral One Microdiskectomy   PARTIAL HYSTERECTOMY     TONSILLECTOMY      Current Outpatient Medications  Medication Sig Dispense Refill   aspirin EC 81 MG tablet Take 1 tablet (81 mg total) by mouth daily. Swallow whole. 90 tablet 3   atorvastatin (LIPITOR) 10 MG tablet Take 0.5 tablets (5 mg total) by mouth daily. 0.5 mg by mouth 5 times a week (Patient taking differently: Take 5 mg by mouth daily. 0.5 mg by mouth 7 times a week) 45 tablet 3   diltiazem (CARDIZEM) 30 MG tablet Take 1 tablet (30 mg total) by mouth daily as needed. 10 tablet 0   Nutritional Supplements (MELATONIN PO) Take 5 mg by mouth at bedtime.     Probiotic Product (ALIGN PO) Take by mouth in the morning and at bedtime. 2 tabs     RESTASIS 0.05 % ophthalmic emulsion 1 drop 2 (two) times daily.     traZODone (DESYREL) 50 MG tablet Take 100 mg by mouth daily.     busPIRone (BUSPAR) 7.5 MG tablet Take 7.5 mg by mouth 2 (two) times daily.     No current facility-administered medications for this visit.    Allergies:   Erythromycin, Flagyl [metronidazole hcl], Flecainide, Levofloxacin, Wellbutrin [bupropion hcl], Zoloft [sertraline hcl], and Restoril   ROS:  Please see the history of present illness.   All other  systems are personally reviewed and negative.    Exam:    Vital Signs:  BP 116/65   Pulse 81   Ht 5' 3.5" (1.613 m)   Wt 124 lb (56.2 kg)   BMI 21.62 kg/m      Labs/Other Tests and Data Reviewed:       ASSESSMENT & PLAN:    PACs-frequent PVCs-rare>> palpitations   Mitral valve prolapse-mild   Life stresses   Coronary artery disease non obstructive, CA Score but nonobstructive    Hyperlipidemia   Alopecia 2/2 flecainide   Didn't tolerate prn dilt, will resume prn atenolol  Will keep in abeyance the propafenone option   Chest pains not likely cardiac, quite atypical; but advised for persistent CP to go to ER Continue low dose  atorva and ASA    Follow-up:  with TR as scheduled and [place] in 12 months     Current medicines are reviewed at length with the patient today.   The patient  concerns regarding her medicines.  The following changes were made today:  none  Labs/ tests ordered today include:  No orders of the defined types were placed in this encounter.     Today, I have spent 13 minutes with the patient with telehealth technology discussing the above.  Signed, Sherryl Manges, MD  11/28/2022 11:55 AM     [facility] [address] [place] [address] [phone] (office) [phone] (fax)

## 2022-11-28 NOTE — Patient Instructions (Signed)
Medication Instructions:  Stop Diltazem  Take Atenolol 25 mg as needed for palpitations.   *If you need a refill on your cardiac medications before your next appointment, please call your pharmacy*   Follow-Up: At [facility], you and your health needs are our priority.  As part of our continuing mission to provide you with exceptional heart care, we have created designated Provider Care Teams.  These Care Teams include your primary Cardiologist (physician) and Advanced Practice Providers (APPs -  Physician Assistants and Nurse Practitioners) who all work together to provide you with the care you need, when you need it.  We recommend signing up for the patient portal called "MyChart".  Sign up information is provided on this After Visit Summary.  MyChart is used to connect with patients for Virtual Visits (Telemedicine).  Patients are able to view lab/test results, encounter notes, upcoming appointments, etc.  Non-urgent messages can be sent to your provider as well.   To learn more about what you can do with MyChart, go to [email or web address].    Your next appointment:   12 month(s)  Provider:   Sherryl Manges, MD

## 2022-11-30 DIAGNOSIS — F4323 Adjustment disorder with mixed anxiety and depressed mood: Secondary | ICD-10-CM | POA: Diagnosis not present

## 2022-11-30 DIAGNOSIS — M9903 Segmental and somatic dysfunction of lumbar region: Secondary | ICD-10-CM | POA: Diagnosis not present

## 2022-11-30 DIAGNOSIS — M9902 Segmental and somatic dysfunction of thoracic region: Secondary | ICD-10-CM | POA: Diagnosis not present

## 2022-11-30 DIAGNOSIS — M9905 Segmental and somatic dysfunction of pelvic region: Secondary | ICD-10-CM | POA: Diagnosis not present

## 2022-11-30 DIAGNOSIS — M9901 Segmental and somatic dysfunction of cervical region: Secondary | ICD-10-CM | POA: Diagnosis not present

## 2022-12-05 DIAGNOSIS — M9903 Segmental and somatic dysfunction of lumbar region: Secondary | ICD-10-CM | POA: Diagnosis not present

## 2022-12-05 DIAGNOSIS — M9905 Segmental and somatic dysfunction of pelvic region: Secondary | ICD-10-CM | POA: Diagnosis not present

## 2022-12-05 DIAGNOSIS — M9902 Segmental and somatic dysfunction of thoracic region: Secondary | ICD-10-CM | POA: Diagnosis not present

## 2022-12-05 DIAGNOSIS — M9901 Segmental and somatic dysfunction of cervical region: Secondary | ICD-10-CM | POA: Diagnosis not present

## 2022-12-06 ENCOUNTER — Ambulatory Visit (INDEPENDENT_AMBULATORY_CARE_PROVIDER_SITE_OTHER): Payer: Medicare Other

## 2022-12-06 DIAGNOSIS — E7800 Pure hypercholesterolemia, unspecified: Secondary | ICD-10-CM | POA: Diagnosis not present

## 2022-12-06 DIAGNOSIS — I341 Nonrheumatic mitral (valve) prolapse: Secondary | ICD-10-CM | POA: Diagnosis not present

## 2022-12-06 LAB — ECHOCARDIOGRAM COMPLETE
Area-P 1/2: 3.6 cm2
Calc EF: 61.6 %
MV M vel: 5.18 m/s
MV Peak grad: 107.3 mmHg
S' Lateral: 2.36 cm
Single Plane A2C EF: 60.1 %
Single Plane A4C EF: 63.9 %

## 2022-12-12 DIAGNOSIS — M9901 Segmental and somatic dysfunction of cervical region: Secondary | ICD-10-CM | POA: Diagnosis not present

## 2022-12-12 DIAGNOSIS — M9903 Segmental and somatic dysfunction of lumbar region: Secondary | ICD-10-CM | POA: Diagnosis not present

## 2022-12-12 DIAGNOSIS — M9902 Segmental and somatic dysfunction of thoracic region: Secondary | ICD-10-CM | POA: Diagnosis not present

## 2022-12-12 DIAGNOSIS — M9905 Segmental and somatic dysfunction of pelvic region: Secondary | ICD-10-CM | POA: Diagnosis not present

## 2022-12-13 ENCOUNTER — Other Ambulatory Visit (HOSPITAL_BASED_OUTPATIENT_CLINIC_OR_DEPARTMENT_OTHER): Payer: Medicare Other

## 2022-12-14 DIAGNOSIS — M9902 Segmental and somatic dysfunction of thoracic region: Secondary | ICD-10-CM | POA: Diagnosis not present

## 2022-12-14 DIAGNOSIS — F4323 Adjustment disorder with mixed anxiety and depressed mood: Secondary | ICD-10-CM | POA: Diagnosis not present

## 2022-12-14 DIAGNOSIS — M9903 Segmental and somatic dysfunction of lumbar region: Secondary | ICD-10-CM | POA: Diagnosis not present

## 2022-12-14 DIAGNOSIS — M9905 Segmental and somatic dysfunction of pelvic region: Secondary | ICD-10-CM | POA: Diagnosis not present

## 2022-12-14 DIAGNOSIS — M9901 Segmental and somatic dysfunction of cervical region: Secondary | ICD-10-CM | POA: Diagnosis not present

## 2022-12-19 DIAGNOSIS — M9905 Segmental and somatic dysfunction of pelvic region: Secondary | ICD-10-CM | POA: Diagnosis not present

## 2022-12-19 DIAGNOSIS — M9902 Segmental and somatic dysfunction of thoracic region: Secondary | ICD-10-CM | POA: Diagnosis not present

## 2022-12-19 DIAGNOSIS — M9903 Segmental and somatic dysfunction of lumbar region: Secondary | ICD-10-CM | POA: Diagnosis not present

## 2022-12-19 DIAGNOSIS — M9901 Segmental and somatic dysfunction of cervical region: Secondary | ICD-10-CM | POA: Diagnosis not present

## 2022-12-20 ENCOUNTER — Encounter (HOSPITAL_BASED_OUTPATIENT_CLINIC_OR_DEPARTMENT_OTHER): Payer: Self-pay | Admitting: Cardiovascular Disease

## 2022-12-20 ENCOUNTER — Ambulatory Visit (HOSPITAL_BASED_OUTPATIENT_CLINIC_OR_DEPARTMENT_OTHER): Payer: Medicare Other | Admitting: Cardiovascular Disease

## 2022-12-20 VITALS — BP 138/62 | HR 86 | Ht 63.5 in | Wt 126.6 lb

## 2022-12-20 DIAGNOSIS — I951 Orthostatic hypotension: Secondary | ICD-10-CM | POA: Diagnosis not present

## 2022-12-20 DIAGNOSIS — I491 Atrial premature depolarization: Secondary | ICD-10-CM

## 2022-12-20 DIAGNOSIS — I493 Ventricular premature depolarization: Secondary | ICD-10-CM

## 2022-12-20 DIAGNOSIS — I10 Essential (primary) hypertension: Secondary | ICD-10-CM

## 2022-12-20 DIAGNOSIS — I341 Nonrheumatic mitral (valve) prolapse: Secondary | ICD-10-CM | POA: Diagnosis not present

## 2022-12-20 NOTE — Patient Instructions (Signed)
  Medication Instructions:  Your physician recommends that you continue on your current medications as directed. Please refer to the Current Medication list given to you today.    *If you need a refill on your cardiac medications before your next appointment, please call your pharmacy*   Lab Work: NONE   Testing/Procedures: NONE   Follow-Up: At [facility], you and your health needs are our priority.  As part of our continuing mission to provide you with exceptional heart care, we have created designated Provider Care Teams.  These Care Teams include your primary Cardiologist (physician) and Advanced Practice Providers (APPs -  Physician Assistants and Nurse Practitioners) who all work together to provide you with the care you need, when you need it.   We recommend signing up for the patient portal called "MyChart".  Sign up information is provided on this After Visit Summary.  MyChart is used to connect with patients for Virtual Visits (Telemedicine).  Patients are able to view lab/test results, encounter notes, upcoming appointments, etc.  Non-urgent messages can be sent to your provider as well.   To learn more about what you can do with MyChart, go to [email or web address].     Your next appointment:   12 month(s)   Provider:   Tiffany Ashe, MD or Caitlin Walker, NP        

## 2022-12-20 NOTE — Progress Notes (Signed)
Cardiology Office Note:  .    Date:  12/20/2022  ID:  Shannon Brennan, DOB 22-Jun-1945, MRN [id number] PCP: Shannon Greathouse, MD  [facility] Providers Cardiologist:  Shannon Si, MD     History of Present Illness: .    Shannon Brennan is a 77 y.o. female with a hx of non-obstructive CAD, arthritis, diverticulosis, hepatic hemangioma, osteopenia, hyperlipidemia, hypertension, moderate pulmonary hypertension, and mitral valve prolapse, who presents today for follow-up. She was previously a patient of Dr. Patty Sermons and she was followed by Norma Fredrickson, NP since that time. She has mitral valve prolapse with mild mitral regurgitation. She had an Echo in 2017 that was negative for ischemia. There was concern for pulmonary hypertension, and she was referred to pulmonary. However, on repeat echo in 2020, pulmonary pressures were normal.  She did still have moderate mitral valve prolapse but no significant mitral regurgitation.   She struggled with COVID 05/2021 and then multiple episodes of bronchitis. During her visit 11/2021 she reported some occasional palpitations.Overall they were well controlled on atenolol.  She was going to work on diet and exercise to better control her lipids. At times felt a little more short of breath going up steps, possibly related to her upper respiratory infections. Her BP in the AM had been 140s-150s prior to medications, improved to 90s-110s a few hours later. We switched her atenolol to carvedilol 3.125 mg BID. She had a repeat echo 11/2021 with LVEF 55-60%, mild LVH, grade 1 diastolic dysfunction, mild prolapse of mitral valve, trivial mitral regurgitation. At her follow-up with Shannon Shields, NP 12/2021 her blood pressures were averaging 130s-140s, but with some lows of 90s/60s. Carvedilol was discontinued and she resumed atenolol 12.5 mg daily. Later added amlodipine 2.5 mg at bedtime. Given family history of CAD and complaints of exertional dyspnea she had a  cardiac CTA 12/2021 showing a coronary calcium score 121, minimal non-obstructive CAD (1-24%), and aortic atherosclerosis. Initiated 81 mg ASA daily. On 03/01/22 she agreed to trial Zetia but was intolerant due to headaches. As of 06/08/22 her LDL was 106. Atorvastatin was increased from 5 times/week to daily. She reported multiple episodes of fluttering and pauses throughout the day. She wore a 7 day Zio monitor revealing 29 episodes of SVT vs atrial tachycardia, longest lasting 15 beats. She had frequent PACs (23.2%), Rare PVCs (<1%), 4 beats of NSVT. She was referred to EP with Dr. Graciela Brennan 06/2022. Atenolol was held in favor of prn calcium blockers. However, this was insufficient and she was transitioned to flecainide 25 mg BID. ETT 08/2022 showed no exercise-induced arrhythmias. She developed significant alopecia. Flecainide was held, she was given diltiazem 30 mg as needed. She was intolerant of diltiazem with atypical chest pressure so it was discontinued. She resumed prn atenolol. Repeat echo 11/2022 showed LVEF 55-60%, mild LVH, grade 1 diastolic dysfunction, mild mitral prolapse and mild mitral regurgitation. There was mild to moderate tricuspid regurgitation. She last saw Dr. Graciela Brennan 10/2022 and decided to hold flecainide due to alopecia. He noted that propafenone would be an option if her palpitations worsened.  Today, she confirms that she is only taking atenolol as needed. She notes that her stress and anxiety are likely contributory to her palpitations. During the time of wearing her monitor, her palpitations and anxiety were worse. Lately they seem to have improved but are still present. Her blood pressures have continued to be labile. This morning she measured her BP at 98 systolic, and in the office her  reading is 168/66 initially (138/62 on manual recheck). She has noticed some lows in the upper 80s possibly due to dehydration in the mornings. Very occasionally she feels lightheaded. By the end of the day  she has mild swelling that will improve by the next morning. She is typically on her feet all day. Additionally she confirms that her alopecia has resolved. For exercise she tries to go on walks, between 20-30 minutes up to 5 days per week. No anginal symptoms. She is able to have a full conversation while walking or going upstairs. She reports completing lab work with her PCP earlier this month, reportedly stable. She denies any chest pain, shortness of breath, headaches, syncope, orthopnea, or PND.  ROS:  Please see the history of present illness. All other systems are reviewed and negative.  (+) Palpitations (+) Very occasional lightheadedness (+) Mild LE edema  Studies Reviewed: .        Echo  21-Dec-2022: 1. Left ventricular ejection fraction, by estimation, is 55 to 60%. The  left ventricle has normal function. The left ventricle has no regional  wall motion abnormalities. There is mild asymmetric left ventricular  hypertrophy of the basal-septal segment.  Left ventricular diastolic parameters are consistent with Grade I  diastolic dysfunction (impaired relaxation).   2. Right ventricular systolic function is normal. The right ventricular  size is normal.   3. The mitral valve is normal in structure. Mild mitral valve  regurgitation. No evidence of mitral stenosis. There is mild prolapse of  both leaflets of the mitral valve.   4. Tricuspid valve regurgitation is mild to moderate.   5. The aortic valve is tricuspid. Aortic valve regurgitation is not  visualized. No aortic stenosis is present.   6. The inferior vena cava is normal in size with greater than 50%  respiratory variability, suggesting right atrial pressure of 3 mmHg.   7 Day Zio Monitor  06/2022: Quality: Fair.  Baseline artifact. Predominant rhythm: sinus rhythm Average heart rate: 68 bpm Max heart rate: 115 bpm Min heart rate: 45 bpm Pauses >2.5 seconds: none   29 episodes of SVT vs. Atrial tachycardia.  Fastest  interval 132 bpm.  Longest episode 15 beats. Frequent (23.2%) PACs Rare (<1%) PVCs 4 beats NSVT  Risk Assessment/Calculations:             Physical Exam:    VS:  BP 138/62 (BP Location: Right Arm, Patient Position: Sitting, Cuff Size: Normal)   Pulse 86   Ht 5' 3.5" (1.613 m)   Wt 126 lb 9.6 oz (57.4 kg)   SpO2 99%   BMI 22.07 kg/m  , BMI Body mass index is 22.07 kg/m. GENERAL:  Well appearing HEENT: Pupils equal round and reactive, fundi not visualized, oral mucosa unremarkable NECK:  No jugular venous distention, waveform within normal limits, carotid upstroke brisk and symmetric, no bruits, no thyromegaly LUNGS:  Clear to auscultation bilaterally HEART:  RRR.  PMI not displaced or sustained,S1 and S2 within normal limits, no S3, no S4, no clicks, no rubs, II/VI systolic murmurs ABD:  Flat, positive bowel sounds normal in frequency in pitch, no bruits, no rebound, no guarding, no midline pulsatile mass, no hepatomegaly, no splenomegaly EXT:  2 plus pulses throughout, no edema, no cyanosis no clubbing SKIN:  No rashes no nodules NEURO:  Cranial nerves II through XII grossly intact, motor grossly intact throughout PSYCH:  Cognitively intact, oriented to person place and time  Wt Readings from Last 3 Encounters:  12/20/22  126 lb 9.6 oz (57.4 kg)  11/28/22 124 lb (56.2 kg)  10/19/22 126 lb 3.2 oz (57.2 kg)     ASSESSMENT AND PLAN: .       # Premature Atrial Contractions (PACs) #SVT:  Patient reports decreased frequency of palpitations. Currently not on any regular medication for PACs, only taking Atenolol as needed. -Continue Atenolol as needed for palpitations.  # Hypertension Blood pressure readings have been variable, with some readings being low (systolic in the 90s) and others being elevated (138/62 in office today). Patient reports occasional lightheadedness which may be related to lower blood pressure readings. -Continue monitoring blood pressure at home, aim for  1-2 readings per week.  # Mitral Valve Prolapse with Mild to Moderate Regurgitation Stable on recent echocardiogram. No new symptoms reported. -Continue current management, no changes needed.  # Hyperlipidemia Patient is on Atorvastatin and Aspirin. Recent cholesterol check in August was reported to be satisfactory by Dr. Cathi Roan. -Continue Atorvastatin and Aspirin.  Follow-up in 1 year, or sooner if needed.             Dispo:  FU with Quintrell Baze C. Duke Salvia, MD, [facility] in 1 year.  I,Mathew Stumpf,acting as a Neurosurgeon for Shannon Si, MD.,have documented all relevant documentation on the behalf of Shannon Si, MD,as directed by  Shannon Si, MD while in the presence of Shannon Si, MD.  I, Sydell Prowell C. Duke Salvia, MD have reviewed all documentation for this visit.  The documentation of the exam, diagnosis, procedures, and orders on 12/20/2022 are all accurate and complete.   Signed, Shannon Si, MD

## 2022-12-21 DIAGNOSIS — M9905 Segmental and somatic dysfunction of pelvic region: Secondary | ICD-10-CM | POA: Diagnosis not present

## 2022-12-21 DIAGNOSIS — M9902 Segmental and somatic dysfunction of thoracic region: Secondary | ICD-10-CM | POA: Diagnosis not present

## 2022-12-21 DIAGNOSIS — M9903 Segmental and somatic dysfunction of lumbar region: Secondary | ICD-10-CM | POA: Diagnosis not present

## 2022-12-21 DIAGNOSIS — M9901 Segmental and somatic dysfunction of cervical region: Secondary | ICD-10-CM | POA: Diagnosis not present

## 2022-12-26 DIAGNOSIS — M9905 Segmental and somatic dysfunction of pelvic region: Secondary | ICD-10-CM | POA: Diagnosis not present

## 2022-12-26 DIAGNOSIS — K080 Exfoliation of teeth due to systemic causes: Secondary | ICD-10-CM | POA: Diagnosis not present

## 2022-12-26 DIAGNOSIS — M9902 Segmental and somatic dysfunction of thoracic region: Secondary | ICD-10-CM | POA: Diagnosis not present

## 2022-12-26 DIAGNOSIS — M9903 Segmental and somatic dysfunction of lumbar region: Secondary | ICD-10-CM | POA: Diagnosis not present

## 2022-12-26 DIAGNOSIS — M9901 Segmental and somatic dysfunction of cervical region: Secondary | ICD-10-CM | POA: Diagnosis not present

## 2022-12-28 DIAGNOSIS — F4323 Adjustment disorder with mixed anxiety and depressed mood: Secondary | ICD-10-CM | POA: Diagnosis not present

## 2022-12-28 DIAGNOSIS — M9902 Segmental and somatic dysfunction of thoracic region: Secondary | ICD-10-CM | POA: Diagnosis not present

## 2022-12-28 DIAGNOSIS — M9903 Segmental and somatic dysfunction of lumbar region: Secondary | ICD-10-CM | POA: Diagnosis not present

## 2022-12-28 DIAGNOSIS — M9901 Segmental and somatic dysfunction of cervical region: Secondary | ICD-10-CM | POA: Diagnosis not present

## 2022-12-28 DIAGNOSIS — M9905 Segmental and somatic dysfunction of pelvic region: Secondary | ICD-10-CM | POA: Diagnosis not present

## 2023-01-02 DIAGNOSIS — M9903 Segmental and somatic dysfunction of lumbar region: Secondary | ICD-10-CM | POA: Diagnosis not present

## 2023-01-02 DIAGNOSIS — M9905 Segmental and somatic dysfunction of pelvic region: Secondary | ICD-10-CM | POA: Diagnosis not present

## 2023-01-02 DIAGNOSIS — M9902 Segmental and somatic dysfunction of thoracic region: Secondary | ICD-10-CM | POA: Diagnosis not present

## 2023-01-02 DIAGNOSIS — M9901 Segmental and somatic dysfunction of cervical region: Secondary | ICD-10-CM | POA: Diagnosis not present

## 2023-01-04 DIAGNOSIS — M9903 Segmental and somatic dysfunction of lumbar region: Secondary | ICD-10-CM | POA: Diagnosis not present

## 2023-01-04 DIAGNOSIS — M9901 Segmental and somatic dysfunction of cervical region: Secondary | ICD-10-CM | POA: Diagnosis not present

## 2023-01-04 DIAGNOSIS — M9902 Segmental and somatic dysfunction of thoracic region: Secondary | ICD-10-CM | POA: Diagnosis not present

## 2023-01-04 DIAGNOSIS — M9905 Segmental and somatic dysfunction of pelvic region: Secondary | ICD-10-CM | POA: Diagnosis not present

## 2023-01-09 DIAGNOSIS — M9901 Segmental and somatic dysfunction of cervical region: Secondary | ICD-10-CM | POA: Diagnosis not present

## 2023-01-09 DIAGNOSIS — M9903 Segmental and somatic dysfunction of lumbar region: Secondary | ICD-10-CM | POA: Diagnosis not present

## 2023-01-09 DIAGNOSIS — M9905 Segmental and somatic dysfunction of pelvic region: Secondary | ICD-10-CM | POA: Diagnosis not present

## 2023-01-09 DIAGNOSIS — M9902 Segmental and somatic dysfunction of thoracic region: Secondary | ICD-10-CM | POA: Diagnosis not present

## 2023-01-11 DIAGNOSIS — F4323 Adjustment disorder with mixed anxiety and depressed mood: Secondary | ICD-10-CM | POA: Diagnosis not present

## 2023-01-11 DIAGNOSIS — M9903 Segmental and somatic dysfunction of lumbar region: Secondary | ICD-10-CM | POA: Diagnosis not present

## 2023-01-11 DIAGNOSIS — M9905 Segmental and somatic dysfunction of pelvic region: Secondary | ICD-10-CM | POA: Diagnosis not present

## 2023-01-11 DIAGNOSIS — M9901 Segmental and somatic dysfunction of cervical region: Secondary | ICD-10-CM | POA: Diagnosis not present

## 2023-01-11 DIAGNOSIS — M9902 Segmental and somatic dysfunction of thoracic region: Secondary | ICD-10-CM | POA: Diagnosis not present

## 2023-01-16 DIAGNOSIS — M9903 Segmental and somatic dysfunction of lumbar region: Secondary | ICD-10-CM | POA: Diagnosis not present

## 2023-01-16 DIAGNOSIS — M9905 Segmental and somatic dysfunction of pelvic region: Secondary | ICD-10-CM | POA: Diagnosis not present

## 2023-01-16 DIAGNOSIS — M9902 Segmental and somatic dysfunction of thoracic region: Secondary | ICD-10-CM | POA: Diagnosis not present

## 2023-01-16 DIAGNOSIS — M9901 Segmental and somatic dysfunction of cervical region: Secondary | ICD-10-CM | POA: Diagnosis not present

## 2023-01-18 DIAGNOSIS — M9901 Segmental and somatic dysfunction of cervical region: Secondary | ICD-10-CM | POA: Diagnosis not present

## 2023-01-18 DIAGNOSIS — M9905 Segmental and somatic dysfunction of pelvic region: Secondary | ICD-10-CM | POA: Diagnosis not present

## 2023-01-18 DIAGNOSIS — M9903 Segmental and somatic dysfunction of lumbar region: Secondary | ICD-10-CM | POA: Diagnosis not present

## 2023-01-18 DIAGNOSIS — M9902 Segmental and somatic dysfunction of thoracic region: Secondary | ICD-10-CM | POA: Diagnosis not present

## 2023-01-23 DIAGNOSIS — M9905 Segmental and somatic dysfunction of pelvic region: Secondary | ICD-10-CM | POA: Diagnosis not present

## 2023-01-23 DIAGNOSIS — M9903 Segmental and somatic dysfunction of lumbar region: Secondary | ICD-10-CM | POA: Diagnosis not present

## 2023-01-23 DIAGNOSIS — M9902 Segmental and somatic dysfunction of thoracic region: Secondary | ICD-10-CM | POA: Diagnosis not present

## 2023-01-23 DIAGNOSIS — M9901 Segmental and somatic dysfunction of cervical region: Secondary | ICD-10-CM | POA: Diagnosis not present

## 2023-01-25 DIAGNOSIS — M9903 Segmental and somatic dysfunction of lumbar region: Secondary | ICD-10-CM | POA: Diagnosis not present

## 2023-01-25 DIAGNOSIS — M9905 Segmental and somatic dysfunction of pelvic region: Secondary | ICD-10-CM | POA: Diagnosis not present

## 2023-01-25 DIAGNOSIS — M9901 Segmental and somatic dysfunction of cervical region: Secondary | ICD-10-CM | POA: Diagnosis not present

## 2023-01-25 DIAGNOSIS — M9902 Segmental and somatic dysfunction of thoracic region: Secondary | ICD-10-CM | POA: Diagnosis not present

## 2023-01-25 DIAGNOSIS — F4323 Adjustment disorder with mixed anxiety and depressed mood: Secondary | ICD-10-CM | POA: Diagnosis not present

## 2023-01-29 DIAGNOSIS — H04123 Dry eye syndrome of bilateral lacrimal glands: Secondary | ICD-10-CM | POA: Diagnosis not present

## 2023-01-29 DIAGNOSIS — H52223 Regular astigmatism, bilateral: Secondary | ICD-10-CM | POA: Diagnosis not present

## 2023-01-29 DIAGNOSIS — H5213 Myopia, bilateral: Secondary | ICD-10-CM | POA: Diagnosis not present

## 2023-01-29 DIAGNOSIS — H524 Presbyopia: Secondary | ICD-10-CM | POA: Diagnosis not present

## 2023-01-30 DIAGNOSIS — M9905 Segmental and somatic dysfunction of pelvic region: Secondary | ICD-10-CM | POA: Diagnosis not present

## 2023-01-30 DIAGNOSIS — M9901 Segmental and somatic dysfunction of cervical region: Secondary | ICD-10-CM | POA: Diagnosis not present

## 2023-01-30 DIAGNOSIS — M9902 Segmental and somatic dysfunction of thoracic region: Secondary | ICD-10-CM | POA: Diagnosis not present

## 2023-01-30 DIAGNOSIS — M9903 Segmental and somatic dysfunction of lumbar region: Secondary | ICD-10-CM | POA: Diagnosis not present

## 2023-02-01 DIAGNOSIS — M9905 Segmental and somatic dysfunction of pelvic region: Secondary | ICD-10-CM | POA: Diagnosis not present

## 2023-02-01 DIAGNOSIS — M9903 Segmental and somatic dysfunction of lumbar region: Secondary | ICD-10-CM | POA: Diagnosis not present

## 2023-02-01 DIAGNOSIS — M9901 Segmental and somatic dysfunction of cervical region: Secondary | ICD-10-CM | POA: Diagnosis not present

## 2023-02-01 DIAGNOSIS — M9902 Segmental and somatic dysfunction of thoracic region: Secondary | ICD-10-CM | POA: Diagnosis not present

## 2023-02-08 DIAGNOSIS — M9905 Segmental and somatic dysfunction of pelvic region: Secondary | ICD-10-CM | POA: Diagnosis not present

## 2023-02-08 DIAGNOSIS — M9903 Segmental and somatic dysfunction of lumbar region: Secondary | ICD-10-CM | POA: Diagnosis not present

## 2023-02-08 DIAGNOSIS — M9901 Segmental and somatic dysfunction of cervical region: Secondary | ICD-10-CM | POA: Diagnosis not present

## 2023-02-08 DIAGNOSIS — M9902 Segmental and somatic dysfunction of thoracic region: Secondary | ICD-10-CM | POA: Diagnosis not present

## 2023-02-13 DIAGNOSIS — M9902 Segmental and somatic dysfunction of thoracic region: Secondary | ICD-10-CM | POA: Diagnosis not present

## 2023-02-13 DIAGNOSIS — M9905 Segmental and somatic dysfunction of pelvic region: Secondary | ICD-10-CM | POA: Diagnosis not present

## 2023-02-13 DIAGNOSIS — M9903 Segmental and somatic dysfunction of lumbar region: Secondary | ICD-10-CM | POA: Diagnosis not present

## 2023-02-13 DIAGNOSIS — M9901 Segmental and somatic dysfunction of cervical region: Secondary | ICD-10-CM | POA: Diagnosis not present

## 2023-02-22 DIAGNOSIS — M9903 Segmental and somatic dysfunction of lumbar region: Secondary | ICD-10-CM | POA: Diagnosis not present

## 2023-02-22 DIAGNOSIS — M9905 Segmental and somatic dysfunction of pelvic region: Secondary | ICD-10-CM | POA: Diagnosis not present

## 2023-02-22 DIAGNOSIS — M9902 Segmental and somatic dysfunction of thoracic region: Secondary | ICD-10-CM | POA: Diagnosis not present

## 2023-02-22 DIAGNOSIS — F4323 Adjustment disorder with mixed anxiety and depressed mood: Secondary | ICD-10-CM | POA: Diagnosis not present

## 2023-02-22 DIAGNOSIS — M9901 Segmental and somatic dysfunction of cervical region: Secondary | ICD-10-CM | POA: Diagnosis not present

## 2023-02-27 DIAGNOSIS — M9901 Segmental and somatic dysfunction of cervical region: Secondary | ICD-10-CM | POA: Diagnosis not present

## 2023-02-27 DIAGNOSIS — M9905 Segmental and somatic dysfunction of pelvic region: Secondary | ICD-10-CM | POA: Diagnosis not present

## 2023-02-27 DIAGNOSIS — M9903 Segmental and somatic dysfunction of lumbar region: Secondary | ICD-10-CM | POA: Diagnosis not present

## 2023-02-27 DIAGNOSIS — M9902 Segmental and somatic dysfunction of thoracic region: Secondary | ICD-10-CM | POA: Diagnosis not present

## 2023-03-06 DIAGNOSIS — M9901 Segmental and somatic dysfunction of cervical region: Secondary | ICD-10-CM | POA: Diagnosis not present

## 2023-03-06 DIAGNOSIS — M9905 Segmental and somatic dysfunction of pelvic region: Secondary | ICD-10-CM | POA: Diagnosis not present

## 2023-03-06 DIAGNOSIS — M9903 Segmental and somatic dysfunction of lumbar region: Secondary | ICD-10-CM | POA: Diagnosis not present

## 2023-03-06 DIAGNOSIS — M9902 Segmental and somatic dysfunction of thoracic region: Secondary | ICD-10-CM | POA: Diagnosis not present

## 2023-03-08 DIAGNOSIS — M9905 Segmental and somatic dysfunction of pelvic region: Secondary | ICD-10-CM | POA: Diagnosis not present

## 2023-03-08 DIAGNOSIS — F4323 Adjustment disorder with mixed anxiety and depressed mood: Secondary | ICD-10-CM | POA: Diagnosis not present

## 2023-03-08 DIAGNOSIS — M9903 Segmental and somatic dysfunction of lumbar region: Secondary | ICD-10-CM | POA: Diagnosis not present

## 2023-03-08 DIAGNOSIS — M9901 Segmental and somatic dysfunction of cervical region: Secondary | ICD-10-CM | POA: Diagnosis not present

## 2023-03-08 DIAGNOSIS — M9902 Segmental and somatic dysfunction of thoracic region: Secondary | ICD-10-CM | POA: Diagnosis not present

## 2023-03-13 DIAGNOSIS — M9901 Segmental and somatic dysfunction of cervical region: Secondary | ICD-10-CM | POA: Diagnosis not present

## 2023-03-13 DIAGNOSIS — M9905 Segmental and somatic dysfunction of pelvic region: Secondary | ICD-10-CM | POA: Diagnosis not present

## 2023-03-13 DIAGNOSIS — M9903 Segmental and somatic dysfunction of lumbar region: Secondary | ICD-10-CM | POA: Diagnosis not present

## 2023-03-13 DIAGNOSIS — M9902 Segmental and somatic dysfunction of thoracic region: Secondary | ICD-10-CM | POA: Diagnosis not present

## 2023-03-15 DIAGNOSIS — M9903 Segmental and somatic dysfunction of lumbar region: Secondary | ICD-10-CM | POA: Diagnosis not present

## 2023-03-15 DIAGNOSIS — M9905 Segmental and somatic dysfunction of pelvic region: Secondary | ICD-10-CM | POA: Diagnosis not present

## 2023-03-15 DIAGNOSIS — M9901 Segmental and somatic dysfunction of cervical region: Secondary | ICD-10-CM | POA: Diagnosis not present

## 2023-03-15 DIAGNOSIS — M9902 Segmental and somatic dysfunction of thoracic region: Secondary | ICD-10-CM | POA: Diagnosis not present

## 2023-03-20 DIAGNOSIS — M9902 Segmental and somatic dysfunction of thoracic region: Secondary | ICD-10-CM | POA: Diagnosis not present

## 2023-03-20 DIAGNOSIS — M9901 Segmental and somatic dysfunction of cervical region: Secondary | ICD-10-CM | POA: Diagnosis not present

## 2023-03-20 DIAGNOSIS — M9905 Segmental and somatic dysfunction of pelvic region: Secondary | ICD-10-CM | POA: Diagnosis not present

## 2023-03-20 DIAGNOSIS — M9903 Segmental and somatic dysfunction of lumbar region: Secondary | ICD-10-CM | POA: Diagnosis not present

## 2023-03-22 ENCOUNTER — Other Ambulatory Visit (HOSPITAL_BASED_OUTPATIENT_CLINIC_OR_DEPARTMENT_OTHER): Payer: Self-pay | Admitting: Family

## 2023-03-22 DIAGNOSIS — M9905 Segmental and somatic dysfunction of pelvic region: Secondary | ICD-10-CM | POA: Diagnosis not present

## 2023-03-22 DIAGNOSIS — M9901 Segmental and somatic dysfunction of cervical region: Secondary | ICD-10-CM | POA: Diagnosis not present

## 2023-03-22 DIAGNOSIS — M9902 Segmental and somatic dysfunction of thoracic region: Secondary | ICD-10-CM | POA: Diagnosis not present

## 2023-03-22 DIAGNOSIS — F4323 Adjustment disorder with mixed anxiety and depressed mood: Secondary | ICD-10-CM | POA: Diagnosis not present

## 2023-03-22 DIAGNOSIS — M9903 Segmental and somatic dysfunction of lumbar region: Secondary | ICD-10-CM | POA: Diagnosis not present

## 2023-03-26 DIAGNOSIS — M9901 Segmental and somatic dysfunction of cervical region: Secondary | ICD-10-CM | POA: Diagnosis not present

## 2023-03-26 DIAGNOSIS — M9905 Segmental and somatic dysfunction of pelvic region: Secondary | ICD-10-CM | POA: Diagnosis not present

## 2023-03-26 DIAGNOSIS — M9903 Segmental and somatic dysfunction of lumbar region: Secondary | ICD-10-CM | POA: Diagnosis not present

## 2023-03-26 DIAGNOSIS — M9902 Segmental and somatic dysfunction of thoracic region: Secondary | ICD-10-CM | POA: Diagnosis not present

## 2023-04-03 DIAGNOSIS — M9905 Segmental and somatic dysfunction of pelvic region: Secondary | ICD-10-CM | POA: Diagnosis not present

## 2023-04-03 DIAGNOSIS — M9903 Segmental and somatic dysfunction of lumbar region: Secondary | ICD-10-CM | POA: Diagnosis not present

## 2023-04-03 DIAGNOSIS — M9902 Segmental and somatic dysfunction of thoracic region: Secondary | ICD-10-CM | POA: Diagnosis not present

## 2023-04-03 DIAGNOSIS — M9901 Segmental and somatic dysfunction of cervical region: Secondary | ICD-10-CM | POA: Diagnosis not present

## 2023-04-04 NOTE — Progress Notes (Signed)
error 

## 2023-04-05 DIAGNOSIS — M9901 Segmental and somatic dysfunction of cervical region: Secondary | ICD-10-CM | POA: Diagnosis not present

## 2023-04-05 DIAGNOSIS — M9905 Segmental and somatic dysfunction of pelvic region: Secondary | ICD-10-CM | POA: Diagnosis not present

## 2023-04-05 DIAGNOSIS — F4323 Adjustment disorder with mixed anxiety and depressed mood: Secondary | ICD-10-CM | POA: Diagnosis not present

## 2023-04-05 DIAGNOSIS — M9903 Segmental and somatic dysfunction of lumbar region: Secondary | ICD-10-CM | POA: Diagnosis not present

## 2023-04-05 DIAGNOSIS — M9902 Segmental and somatic dysfunction of thoracic region: Secondary | ICD-10-CM | POA: Diagnosis not present

## 2023-04-10 DIAGNOSIS — M9905 Segmental and somatic dysfunction of pelvic region: Secondary | ICD-10-CM | POA: Diagnosis not present

## 2023-04-10 DIAGNOSIS — M9903 Segmental and somatic dysfunction of lumbar region: Secondary | ICD-10-CM | POA: Diagnosis not present

## 2023-04-10 DIAGNOSIS — M9902 Segmental and somatic dysfunction of thoracic region: Secondary | ICD-10-CM | POA: Diagnosis not present

## 2023-04-10 DIAGNOSIS — M9901 Segmental and somatic dysfunction of cervical region: Secondary | ICD-10-CM | POA: Diagnosis not present

## 2023-04-12 DIAGNOSIS — M9901 Segmental and somatic dysfunction of cervical region: Secondary | ICD-10-CM | POA: Diagnosis not present

## 2023-04-12 DIAGNOSIS — M9903 Segmental and somatic dysfunction of lumbar region: Secondary | ICD-10-CM | POA: Diagnosis not present

## 2023-04-12 DIAGNOSIS — M9905 Segmental and somatic dysfunction of pelvic region: Secondary | ICD-10-CM | POA: Diagnosis not present

## 2023-04-12 DIAGNOSIS — M9902 Segmental and somatic dysfunction of thoracic region: Secondary | ICD-10-CM | POA: Diagnosis not present

## 2023-04-13 ENCOUNTER — Other Ambulatory Visit: Payer: Self-pay | Admitting: Gastroenterology

## 2023-04-13 DIAGNOSIS — K824 Cholesterolosis of gallbladder: Secondary | ICD-10-CM | POA: Diagnosis not present

## 2023-04-13 DIAGNOSIS — D1803 Hemangioma of intra-abdominal structures: Secondary | ICD-10-CM | POA: Diagnosis not present

## 2023-04-13 DIAGNOSIS — K769 Liver disease, unspecified: Secondary | ICD-10-CM

## 2023-04-16 DIAGNOSIS — I700 Atherosclerosis of aorta: Secondary | ICD-10-CM | POA: Diagnosis not present

## 2023-04-16 DIAGNOSIS — E785 Hyperlipidemia, unspecified: Secondary | ICD-10-CM | POA: Diagnosis not present

## 2023-04-17 DIAGNOSIS — M9901 Segmental and somatic dysfunction of cervical region: Secondary | ICD-10-CM | POA: Diagnosis not present

## 2023-04-17 DIAGNOSIS — M9903 Segmental and somatic dysfunction of lumbar region: Secondary | ICD-10-CM | POA: Diagnosis not present

## 2023-04-17 DIAGNOSIS — M9902 Segmental and somatic dysfunction of thoracic region: Secondary | ICD-10-CM | POA: Diagnosis not present

## 2023-04-17 DIAGNOSIS — M9905 Segmental and somatic dysfunction of pelvic region: Secondary | ICD-10-CM | POA: Diagnosis not present

## 2023-04-19 DIAGNOSIS — M9902 Segmental and somatic dysfunction of thoracic region: Secondary | ICD-10-CM | POA: Diagnosis not present

## 2023-04-19 DIAGNOSIS — M9901 Segmental and somatic dysfunction of cervical region: Secondary | ICD-10-CM | POA: Diagnosis not present

## 2023-04-19 DIAGNOSIS — M9903 Segmental and somatic dysfunction of lumbar region: Secondary | ICD-10-CM | POA: Diagnosis not present

## 2023-04-19 DIAGNOSIS — M9905 Segmental and somatic dysfunction of pelvic region: Secondary | ICD-10-CM | POA: Diagnosis not present

## 2023-04-19 DIAGNOSIS — F4323 Adjustment disorder with mixed anxiety and depressed mood: Secondary | ICD-10-CM | POA: Diagnosis not present

## 2023-04-23 DIAGNOSIS — M9901 Segmental and somatic dysfunction of cervical region: Secondary | ICD-10-CM | POA: Diagnosis not present

## 2023-04-23 DIAGNOSIS — M9902 Segmental and somatic dysfunction of thoracic region: Secondary | ICD-10-CM | POA: Diagnosis not present

## 2023-04-23 DIAGNOSIS — M9903 Segmental and somatic dysfunction of lumbar region: Secondary | ICD-10-CM | POA: Diagnosis not present

## 2023-04-23 DIAGNOSIS — M9905 Segmental and somatic dysfunction of pelvic region: Secondary | ICD-10-CM | POA: Diagnosis not present

## 2023-04-26 DIAGNOSIS — M9901 Segmental and somatic dysfunction of cervical region: Secondary | ICD-10-CM | POA: Diagnosis not present

## 2023-04-26 DIAGNOSIS — M9903 Segmental and somatic dysfunction of lumbar region: Secondary | ICD-10-CM | POA: Diagnosis not present

## 2023-04-26 DIAGNOSIS — M9902 Segmental and somatic dysfunction of thoracic region: Secondary | ICD-10-CM | POA: Diagnosis not present

## 2023-04-26 DIAGNOSIS — M9905 Segmental and somatic dysfunction of pelvic region: Secondary | ICD-10-CM | POA: Diagnosis not present

## 2023-05-01 DIAGNOSIS — M9901 Segmental and somatic dysfunction of cervical region: Secondary | ICD-10-CM | POA: Diagnosis not present

## 2023-05-01 DIAGNOSIS — M9902 Segmental and somatic dysfunction of thoracic region: Secondary | ICD-10-CM | POA: Diagnosis not present

## 2023-05-01 DIAGNOSIS — M9903 Segmental and somatic dysfunction of lumbar region: Secondary | ICD-10-CM | POA: Diagnosis not present

## 2023-05-01 DIAGNOSIS — M9905 Segmental and somatic dysfunction of pelvic region: Secondary | ICD-10-CM | POA: Diagnosis not present

## 2023-05-03 DIAGNOSIS — F4323 Adjustment disorder with mixed anxiety and depressed mood: Secondary | ICD-10-CM | POA: Diagnosis not present

## 2023-05-03 DIAGNOSIS — M9905 Segmental and somatic dysfunction of pelvic region: Secondary | ICD-10-CM | POA: Diagnosis not present

## 2023-05-03 DIAGNOSIS — M9903 Segmental and somatic dysfunction of lumbar region: Secondary | ICD-10-CM | POA: Diagnosis not present

## 2023-05-03 DIAGNOSIS — M9902 Segmental and somatic dysfunction of thoracic region: Secondary | ICD-10-CM | POA: Diagnosis not present

## 2023-05-03 DIAGNOSIS — M9901 Segmental and somatic dysfunction of cervical region: Secondary | ICD-10-CM | POA: Diagnosis not present

## 2023-05-07 ENCOUNTER — Other Ambulatory Visit: Payer: Medicare Other

## 2023-05-08 DIAGNOSIS — M9905 Segmental and somatic dysfunction of pelvic region: Secondary | ICD-10-CM | POA: Diagnosis not present

## 2023-05-08 DIAGNOSIS — M9901 Segmental and somatic dysfunction of cervical region: Secondary | ICD-10-CM | POA: Diagnosis not present

## 2023-05-08 DIAGNOSIS — M9902 Segmental and somatic dysfunction of thoracic region: Secondary | ICD-10-CM | POA: Diagnosis not present

## 2023-05-08 DIAGNOSIS — M9903 Segmental and somatic dysfunction of lumbar region: Secondary | ICD-10-CM | POA: Diagnosis not present

## 2023-05-10 ENCOUNTER — Encounter (HOSPITAL_BASED_OUTPATIENT_CLINIC_OR_DEPARTMENT_OTHER): Payer: Self-pay

## 2023-05-10 DIAGNOSIS — M9903 Segmental and somatic dysfunction of lumbar region: Secondary | ICD-10-CM | POA: Diagnosis not present

## 2023-05-10 DIAGNOSIS — M9905 Segmental and somatic dysfunction of pelvic region: Secondary | ICD-10-CM | POA: Diagnosis not present

## 2023-05-10 DIAGNOSIS — M9901 Segmental and somatic dysfunction of cervical region: Secondary | ICD-10-CM | POA: Diagnosis not present

## 2023-05-10 DIAGNOSIS — M9902 Segmental and somatic dysfunction of thoracic region: Secondary | ICD-10-CM | POA: Diagnosis not present

## 2023-05-10 MED ORDER — ATORVASTATIN CALCIUM 10 MG PO TABS: 5.0000 mg | ORAL_TABLET | Freq: Every day | ORAL | 3 refills | Status: DC

## 2023-05-14 ENCOUNTER — Ambulatory Visit
Admission: RE | Admit: 2023-05-14 | Discharge: 2023-05-14 | Disposition: A | Discharge status: Home / Self Care | Payer: Medicare Other | Source: Ambulatory Visit | Attending: Gastroenterology | Admitting: Gastroenterology

## 2023-05-14 DIAGNOSIS — K7689 Other specified diseases of liver: Secondary | ICD-10-CM | POA: Diagnosis not present

## 2023-05-14 DIAGNOSIS — K824 Cholesterolosis of gallbladder: Secondary | ICD-10-CM | POA: Diagnosis not present

## 2023-05-14 DIAGNOSIS — K769 Liver disease, unspecified: Secondary | ICD-10-CM

## 2023-05-15 DIAGNOSIS — M9905 Segmental and somatic dysfunction of pelvic region: Secondary | ICD-10-CM | POA: Diagnosis not present

## 2023-05-15 DIAGNOSIS — M9901 Segmental and somatic dysfunction of cervical region: Secondary | ICD-10-CM | POA: Diagnosis not present

## 2023-05-15 DIAGNOSIS — M9903 Segmental and somatic dysfunction of lumbar region: Secondary | ICD-10-CM | POA: Diagnosis not present

## 2023-05-15 DIAGNOSIS — M9902 Segmental and somatic dysfunction of thoracic region: Secondary | ICD-10-CM | POA: Diagnosis not present

## 2023-05-17 DIAGNOSIS — M9905 Segmental and somatic dysfunction of pelvic region: Secondary | ICD-10-CM | POA: Diagnosis not present

## 2023-05-17 DIAGNOSIS — F4323 Adjustment disorder with mixed anxiety and depressed mood: Secondary | ICD-10-CM | POA: Diagnosis not present

## 2023-05-17 DIAGNOSIS — M9901 Segmental and somatic dysfunction of cervical region: Secondary | ICD-10-CM | POA: Diagnosis not present

## 2023-05-17 DIAGNOSIS — M9903 Segmental and somatic dysfunction of lumbar region: Secondary | ICD-10-CM | POA: Diagnosis not present

## 2023-05-17 DIAGNOSIS — M9902 Segmental and somatic dysfunction of thoracic region: Secondary | ICD-10-CM | POA: Diagnosis not present

## 2023-05-22 DIAGNOSIS — M9901 Segmental and somatic dysfunction of cervical region: Secondary | ICD-10-CM | POA: Diagnosis not present

## 2023-05-22 DIAGNOSIS — M9905 Segmental and somatic dysfunction of pelvic region: Secondary | ICD-10-CM | POA: Diagnosis not present

## 2023-05-22 DIAGNOSIS — M9902 Segmental and somatic dysfunction of thoracic region: Secondary | ICD-10-CM | POA: Diagnosis not present

## 2023-05-22 DIAGNOSIS — M9903 Segmental and somatic dysfunction of lumbar region: Secondary | ICD-10-CM | POA: Diagnosis not present

## 2023-05-24 DIAGNOSIS — M9905 Segmental and somatic dysfunction of pelvic region: Secondary | ICD-10-CM | POA: Diagnosis not present

## 2023-05-24 DIAGNOSIS — M9901 Segmental and somatic dysfunction of cervical region: Secondary | ICD-10-CM | POA: Diagnosis not present

## 2023-05-24 DIAGNOSIS — M9902 Segmental and somatic dysfunction of thoracic region: Secondary | ICD-10-CM | POA: Diagnosis not present

## 2023-05-24 DIAGNOSIS — M9903 Segmental and somatic dysfunction of lumbar region: Secondary | ICD-10-CM | POA: Diagnosis not present

## 2023-05-29 DIAGNOSIS — M9902 Segmental and somatic dysfunction of thoracic region: Secondary | ICD-10-CM | POA: Diagnosis not present

## 2023-05-29 DIAGNOSIS — M9905 Segmental and somatic dysfunction of pelvic region: Secondary | ICD-10-CM | POA: Diagnosis not present

## 2023-05-29 DIAGNOSIS — M9901 Segmental and somatic dysfunction of cervical region: Secondary | ICD-10-CM | POA: Diagnosis not present

## 2023-05-29 DIAGNOSIS — M9903 Segmental and somatic dysfunction of lumbar region: Secondary | ICD-10-CM | POA: Diagnosis not present

## 2023-05-31 DIAGNOSIS — M9905 Segmental and somatic dysfunction of pelvic region: Secondary | ICD-10-CM | POA: Diagnosis not present

## 2023-05-31 DIAGNOSIS — M9901 Segmental and somatic dysfunction of cervical region: Secondary | ICD-10-CM | POA: Diagnosis not present

## 2023-05-31 DIAGNOSIS — M9903 Segmental and somatic dysfunction of lumbar region: Secondary | ICD-10-CM | POA: Diagnosis not present

## 2023-05-31 DIAGNOSIS — M9902 Segmental and somatic dysfunction of thoracic region: Secondary | ICD-10-CM | POA: Diagnosis not present

## 2023-06-04 DIAGNOSIS — F4323 Adjustment disorder with mixed anxiety and depressed mood: Secondary | ICD-10-CM | POA: Diagnosis not present

## 2023-06-05 DIAGNOSIS — M62830 Muscle spasm of back: Secondary | ICD-10-CM | POA: Diagnosis not present

## 2023-06-05 DIAGNOSIS — M5441 Lumbago with sciatica, right side: Secondary | ICD-10-CM | POA: Diagnosis not present

## 2023-06-05 DIAGNOSIS — M9903 Segmental and somatic dysfunction of lumbar region: Secondary | ICD-10-CM | POA: Diagnosis not present

## 2023-06-05 DIAGNOSIS — M9901 Segmental and somatic dysfunction of cervical region: Secondary | ICD-10-CM | POA: Diagnosis not present

## 2023-06-07 DIAGNOSIS — M9901 Segmental and somatic dysfunction of cervical region: Secondary | ICD-10-CM | POA: Diagnosis not present

## 2023-06-07 DIAGNOSIS — M9902 Segmental and somatic dysfunction of thoracic region: Secondary | ICD-10-CM | POA: Diagnosis not present

## 2023-06-07 DIAGNOSIS — M9905 Segmental and somatic dysfunction of pelvic region: Secondary | ICD-10-CM | POA: Diagnosis not present

## 2023-06-07 DIAGNOSIS — M9903 Segmental and somatic dysfunction of lumbar region: Secondary | ICD-10-CM | POA: Diagnosis not present

## 2023-06-12 DIAGNOSIS — M9903 Segmental and somatic dysfunction of lumbar region: Secondary | ICD-10-CM | POA: Diagnosis not present

## 2023-06-12 DIAGNOSIS — M5441 Lumbago with sciatica, right side: Secondary | ICD-10-CM | POA: Diagnosis not present

## 2023-06-12 DIAGNOSIS — M9901 Segmental and somatic dysfunction of cervical region: Secondary | ICD-10-CM | POA: Diagnosis not present

## 2023-06-12 DIAGNOSIS — M62830 Muscle spasm of back: Secondary | ICD-10-CM | POA: Diagnosis not present

## 2023-06-14 DIAGNOSIS — M9903 Segmental and somatic dysfunction of lumbar region: Secondary | ICD-10-CM | POA: Diagnosis not present

## 2023-06-14 DIAGNOSIS — M9905 Segmental and somatic dysfunction of pelvic region: Secondary | ICD-10-CM | POA: Diagnosis not present

## 2023-06-14 DIAGNOSIS — M9902 Segmental and somatic dysfunction of thoracic region: Secondary | ICD-10-CM | POA: Diagnosis not present

## 2023-06-14 DIAGNOSIS — F4323 Adjustment disorder with mixed anxiety and depressed mood: Secondary | ICD-10-CM | POA: Diagnosis not present

## 2023-06-14 DIAGNOSIS — M9901 Segmental and somatic dysfunction of cervical region: Secondary | ICD-10-CM | POA: Diagnosis not present

## 2023-06-17 DIAGNOSIS — R519 Headache, unspecified: Secondary | ICD-10-CM | POA: Diagnosis not present

## 2023-06-17 DIAGNOSIS — R0981 Nasal congestion: Secondary | ICD-10-CM | POA: Diagnosis not present

## 2023-06-17 DIAGNOSIS — R509 Fever, unspecified: Secondary | ICD-10-CM | POA: Diagnosis not present

## 2023-06-17 DIAGNOSIS — R051 Acute cough: Secondary | ICD-10-CM | POA: Diagnosis not present

## 2023-06-26 DIAGNOSIS — M9901 Segmental and somatic dysfunction of cervical region: Secondary | ICD-10-CM | POA: Diagnosis not present

## 2023-06-26 DIAGNOSIS — M9903 Segmental and somatic dysfunction of lumbar region: Secondary | ICD-10-CM | POA: Diagnosis not present

## 2023-06-26 DIAGNOSIS — M5441 Lumbago with sciatica, right side: Secondary | ICD-10-CM | POA: Diagnosis not present

## 2023-06-26 DIAGNOSIS — M62830 Muscle spasm of back: Secondary | ICD-10-CM | POA: Diagnosis not present

## 2023-06-28 DIAGNOSIS — M9901 Segmental and somatic dysfunction of cervical region: Secondary | ICD-10-CM | POA: Diagnosis not present

## 2023-06-28 DIAGNOSIS — M9903 Segmental and somatic dysfunction of lumbar region: Secondary | ICD-10-CM | POA: Diagnosis not present

## 2023-06-28 DIAGNOSIS — M9905 Segmental and somatic dysfunction of pelvic region: Secondary | ICD-10-CM | POA: Diagnosis not present

## 2023-06-28 DIAGNOSIS — M9902 Segmental and somatic dysfunction of thoracic region: Secondary | ICD-10-CM | POA: Diagnosis not present

## 2023-06-28 DIAGNOSIS — F4323 Adjustment disorder with mixed anxiety and depressed mood: Secondary | ICD-10-CM | POA: Diagnosis not present

## 2023-07-03 DIAGNOSIS — M9903 Segmental and somatic dysfunction of lumbar region: Secondary | ICD-10-CM | POA: Diagnosis not present

## 2023-07-03 DIAGNOSIS — M5441 Lumbago with sciatica, right side: Secondary | ICD-10-CM | POA: Diagnosis not present

## 2023-07-03 DIAGNOSIS — L814 Other melanin hyperpigmentation: Secondary | ICD-10-CM | POA: Diagnosis not present

## 2023-07-03 DIAGNOSIS — Z85828 Personal history of other malignant neoplasm of skin: Secondary | ICD-10-CM | POA: Diagnosis not present

## 2023-07-03 DIAGNOSIS — Z85820 Personal history of malignant melanoma of skin: Secondary | ICD-10-CM | POA: Diagnosis not present

## 2023-07-03 DIAGNOSIS — M9901 Segmental and somatic dysfunction of cervical region: Secondary | ICD-10-CM | POA: Diagnosis not present

## 2023-07-03 DIAGNOSIS — M62830 Muscle spasm of back: Secondary | ICD-10-CM | POA: Diagnosis not present

## 2023-07-03 DIAGNOSIS — D2262 Melanocytic nevi of left upper limb, including shoulder: Secondary | ICD-10-CM | POA: Diagnosis not present

## 2023-07-04 DIAGNOSIS — K080 Exfoliation of teeth due to systemic causes: Secondary | ICD-10-CM | POA: Diagnosis not present

## 2023-07-05 DIAGNOSIS — M9903 Segmental and somatic dysfunction of lumbar region: Secondary | ICD-10-CM | POA: Diagnosis not present

## 2023-07-05 DIAGNOSIS — M9902 Segmental and somatic dysfunction of thoracic region: Secondary | ICD-10-CM | POA: Diagnosis not present

## 2023-07-05 DIAGNOSIS — M9905 Segmental and somatic dysfunction of pelvic region: Secondary | ICD-10-CM | POA: Diagnosis not present

## 2023-07-05 DIAGNOSIS — M9901 Segmental and somatic dysfunction of cervical region: Secondary | ICD-10-CM | POA: Diagnosis not present

## 2023-07-10 DIAGNOSIS — M9903 Segmental and somatic dysfunction of lumbar region: Secondary | ICD-10-CM | POA: Diagnosis not present

## 2023-07-10 DIAGNOSIS — M5441 Lumbago with sciatica, right side: Secondary | ICD-10-CM | POA: Diagnosis not present

## 2023-07-10 DIAGNOSIS — M9901 Segmental and somatic dysfunction of cervical region: Secondary | ICD-10-CM | POA: Diagnosis not present

## 2023-07-10 DIAGNOSIS — M62830 Muscle spasm of back: Secondary | ICD-10-CM | POA: Diagnosis not present

## 2023-07-12 DIAGNOSIS — M9902 Segmental and somatic dysfunction of thoracic region: Secondary | ICD-10-CM | POA: Diagnosis not present

## 2023-07-12 DIAGNOSIS — M9903 Segmental and somatic dysfunction of lumbar region: Secondary | ICD-10-CM | POA: Diagnosis not present

## 2023-07-12 DIAGNOSIS — M9901 Segmental and somatic dysfunction of cervical region: Secondary | ICD-10-CM | POA: Diagnosis not present

## 2023-07-12 DIAGNOSIS — M9905 Segmental and somatic dysfunction of pelvic region: Secondary | ICD-10-CM | POA: Diagnosis not present

## 2023-07-12 DIAGNOSIS — F4323 Adjustment disorder with mixed anxiety and depressed mood: Secondary | ICD-10-CM | POA: Diagnosis not present

## 2023-07-17 DIAGNOSIS — M62830 Muscle spasm of back: Secondary | ICD-10-CM | POA: Diagnosis not present

## 2023-07-17 DIAGNOSIS — M5441 Lumbago with sciatica, right side: Secondary | ICD-10-CM | POA: Diagnosis not present

## 2023-07-17 DIAGNOSIS — M9903 Segmental and somatic dysfunction of lumbar region: Secondary | ICD-10-CM | POA: Diagnosis not present

## 2023-07-17 DIAGNOSIS — M9901 Segmental and somatic dysfunction of cervical region: Secondary | ICD-10-CM | POA: Diagnosis not present

## 2023-07-19 DIAGNOSIS — M9905 Segmental and somatic dysfunction of pelvic region: Secondary | ICD-10-CM | POA: Diagnosis not present

## 2023-07-19 DIAGNOSIS — M9903 Segmental and somatic dysfunction of lumbar region: Secondary | ICD-10-CM | POA: Diagnosis not present

## 2023-07-19 DIAGNOSIS — M9901 Segmental and somatic dysfunction of cervical region: Secondary | ICD-10-CM | POA: Diagnosis not present

## 2023-07-19 DIAGNOSIS — M9902 Segmental and somatic dysfunction of thoracic region: Secondary | ICD-10-CM | POA: Diagnosis not present

## 2023-07-24 DIAGNOSIS — M5441 Lumbago with sciatica, right side: Secondary | ICD-10-CM | POA: Diagnosis not present

## 2023-07-24 DIAGNOSIS — M9903 Segmental and somatic dysfunction of lumbar region: Secondary | ICD-10-CM | POA: Diagnosis not present

## 2023-07-24 DIAGNOSIS — M9901 Segmental and somatic dysfunction of cervical region: Secondary | ICD-10-CM | POA: Diagnosis not present

## 2023-07-24 DIAGNOSIS — M62830 Muscle spasm of back: Secondary | ICD-10-CM | POA: Diagnosis not present

## 2023-07-26 DIAGNOSIS — M9905 Segmental and somatic dysfunction of pelvic region: Secondary | ICD-10-CM | POA: Diagnosis not present

## 2023-07-26 DIAGNOSIS — M9901 Segmental and somatic dysfunction of cervical region: Secondary | ICD-10-CM | POA: Diagnosis not present

## 2023-07-26 DIAGNOSIS — M9903 Segmental and somatic dysfunction of lumbar region: Secondary | ICD-10-CM | POA: Diagnosis not present

## 2023-07-26 DIAGNOSIS — M9902 Segmental and somatic dysfunction of thoracic region: Secondary | ICD-10-CM | POA: Diagnosis not present

## 2023-07-31 DIAGNOSIS — M62830 Muscle spasm of back: Secondary | ICD-10-CM | POA: Diagnosis not present

## 2023-07-31 DIAGNOSIS — M5441 Lumbago with sciatica, right side: Secondary | ICD-10-CM | POA: Diagnosis not present

## 2023-07-31 DIAGNOSIS — M9903 Segmental and somatic dysfunction of lumbar region: Secondary | ICD-10-CM | POA: Diagnosis not present

## 2023-07-31 DIAGNOSIS — M9901 Segmental and somatic dysfunction of cervical region: Secondary | ICD-10-CM | POA: Diagnosis not present

## 2023-08-02 DIAGNOSIS — M9903 Segmental and somatic dysfunction of lumbar region: Secondary | ICD-10-CM | POA: Diagnosis not present

## 2023-08-02 DIAGNOSIS — M9902 Segmental and somatic dysfunction of thoracic region: Secondary | ICD-10-CM | POA: Diagnosis not present

## 2023-08-02 DIAGNOSIS — M9905 Segmental and somatic dysfunction of pelvic region: Secondary | ICD-10-CM | POA: Diagnosis not present

## 2023-08-02 DIAGNOSIS — M9901 Segmental and somatic dysfunction of cervical region: Secondary | ICD-10-CM | POA: Diagnosis not present

## 2023-08-07 DIAGNOSIS — M5441 Lumbago with sciatica, right side: Secondary | ICD-10-CM | POA: Diagnosis not present

## 2023-08-07 DIAGNOSIS — M9903 Segmental and somatic dysfunction of lumbar region: Secondary | ICD-10-CM | POA: Diagnosis not present

## 2023-08-07 DIAGNOSIS — M9901 Segmental and somatic dysfunction of cervical region: Secondary | ICD-10-CM | POA: Diagnosis not present

## 2023-08-07 DIAGNOSIS — M62830 Muscle spasm of back: Secondary | ICD-10-CM | POA: Diagnosis not present

## 2023-08-09 DIAGNOSIS — F4323 Adjustment disorder with mixed anxiety and depressed mood: Secondary | ICD-10-CM | POA: Diagnosis not present

## 2023-08-09 DIAGNOSIS — M5441 Lumbago with sciatica, right side: Secondary | ICD-10-CM | POA: Diagnosis not present

## 2023-08-09 DIAGNOSIS — M9903 Segmental and somatic dysfunction of lumbar region: Secondary | ICD-10-CM | POA: Diagnosis not present

## 2023-08-09 DIAGNOSIS — M62830 Muscle spasm of back: Secondary | ICD-10-CM | POA: Diagnosis not present

## 2023-08-09 DIAGNOSIS — M9901 Segmental and somatic dysfunction of cervical region: Secondary | ICD-10-CM | POA: Diagnosis not present

## 2023-08-14 DIAGNOSIS — M5441 Lumbago with sciatica, right side: Secondary | ICD-10-CM | POA: Diagnosis not present

## 2023-08-14 DIAGNOSIS — M9901 Segmental and somatic dysfunction of cervical region: Secondary | ICD-10-CM | POA: Diagnosis not present

## 2023-08-14 DIAGNOSIS — M62830 Muscle spasm of back: Secondary | ICD-10-CM | POA: Diagnosis not present

## 2023-08-14 DIAGNOSIS — M9903 Segmental and somatic dysfunction of lumbar region: Secondary | ICD-10-CM | POA: Diagnosis not present

## 2023-08-16 DIAGNOSIS — M9901 Segmental and somatic dysfunction of cervical region: Secondary | ICD-10-CM | POA: Diagnosis not present

## 2023-08-16 DIAGNOSIS — M62830 Muscle spasm of back: Secondary | ICD-10-CM | POA: Diagnosis not present

## 2023-08-16 DIAGNOSIS — M5441 Lumbago with sciatica, right side: Secondary | ICD-10-CM | POA: Diagnosis not present

## 2023-08-16 DIAGNOSIS — M9903 Segmental and somatic dysfunction of lumbar region: Secondary | ICD-10-CM | POA: Diagnosis not present

## 2023-08-21 DIAGNOSIS — M62830 Muscle spasm of back: Secondary | ICD-10-CM | POA: Diagnosis not present

## 2023-08-21 DIAGNOSIS — M9901 Segmental and somatic dysfunction of cervical region: Secondary | ICD-10-CM | POA: Diagnosis not present

## 2023-08-21 DIAGNOSIS — M5441 Lumbago with sciatica, right side: Secondary | ICD-10-CM | POA: Diagnosis not present

## 2023-08-21 DIAGNOSIS — M9903 Segmental and somatic dysfunction of lumbar region: Secondary | ICD-10-CM | POA: Diagnosis not present

## 2023-08-23 DIAGNOSIS — M62830 Muscle spasm of back: Secondary | ICD-10-CM | POA: Diagnosis not present

## 2023-08-23 DIAGNOSIS — M9903 Segmental and somatic dysfunction of lumbar region: Secondary | ICD-10-CM | POA: Diagnosis not present

## 2023-08-23 DIAGNOSIS — M9901 Segmental and somatic dysfunction of cervical region: Secondary | ICD-10-CM | POA: Diagnosis not present

## 2023-08-23 DIAGNOSIS — F4323 Adjustment disorder with mixed anxiety and depressed mood: Secondary | ICD-10-CM | POA: Diagnosis not present

## 2023-08-23 DIAGNOSIS — M5441 Lumbago with sciatica, right side: Secondary | ICD-10-CM | POA: Diagnosis not present

## 2023-08-28 DIAGNOSIS — M62830 Muscle spasm of back: Secondary | ICD-10-CM | POA: Diagnosis not present

## 2023-08-28 DIAGNOSIS — M9901 Segmental and somatic dysfunction of cervical region: Secondary | ICD-10-CM | POA: Diagnosis not present

## 2023-08-28 DIAGNOSIS — M9903 Segmental and somatic dysfunction of lumbar region: Secondary | ICD-10-CM | POA: Diagnosis not present

## 2023-08-28 DIAGNOSIS — M5441 Lumbago with sciatica, right side: Secondary | ICD-10-CM | POA: Diagnosis not present

## 2023-08-30 DIAGNOSIS — M9901 Segmental and somatic dysfunction of cervical region: Secondary | ICD-10-CM | POA: Diagnosis not present

## 2023-08-30 DIAGNOSIS — M9903 Segmental and somatic dysfunction of lumbar region: Secondary | ICD-10-CM | POA: Diagnosis not present

## 2023-08-30 DIAGNOSIS — M5441 Lumbago with sciatica, right side: Secondary | ICD-10-CM | POA: Diagnosis not present

## 2023-08-30 DIAGNOSIS — M62830 Muscle spasm of back: Secondary | ICD-10-CM | POA: Diagnosis not present

## 2023-09-04 DIAGNOSIS — M5441 Lumbago with sciatica, right side: Secondary | ICD-10-CM | POA: Diagnosis not present

## 2023-09-04 DIAGNOSIS — M62830 Muscle spasm of back: Secondary | ICD-10-CM | POA: Diagnosis not present

## 2023-09-04 DIAGNOSIS — M9903 Segmental and somatic dysfunction of lumbar region: Secondary | ICD-10-CM | POA: Diagnosis not present

## 2023-09-04 DIAGNOSIS — M9901 Segmental and somatic dysfunction of cervical region: Secondary | ICD-10-CM | POA: Diagnosis not present

## 2023-09-06 DIAGNOSIS — M9901 Segmental and somatic dysfunction of cervical region: Secondary | ICD-10-CM | POA: Diagnosis not present

## 2023-09-06 DIAGNOSIS — F4323 Adjustment disorder with mixed anxiety and depressed mood: Secondary | ICD-10-CM | POA: Diagnosis not present

## 2023-09-06 DIAGNOSIS — M62830 Muscle spasm of back: Secondary | ICD-10-CM | POA: Diagnosis not present

## 2023-09-06 DIAGNOSIS — M5441 Lumbago with sciatica, right side: Secondary | ICD-10-CM | POA: Diagnosis not present

## 2023-09-06 DIAGNOSIS — M9903 Segmental and somatic dysfunction of lumbar region: Secondary | ICD-10-CM | POA: Diagnosis not present

## 2023-09-11 DIAGNOSIS — M9901 Segmental and somatic dysfunction of cervical region: Secondary | ICD-10-CM | POA: Diagnosis not present

## 2023-09-11 DIAGNOSIS — M5441 Lumbago with sciatica, right side: Secondary | ICD-10-CM | POA: Diagnosis not present

## 2023-09-11 DIAGNOSIS — M62830 Muscle spasm of back: Secondary | ICD-10-CM | POA: Diagnosis not present

## 2023-09-11 DIAGNOSIS — M9903 Segmental and somatic dysfunction of lumbar region: Secondary | ICD-10-CM | POA: Diagnosis not present

## 2023-09-13 DIAGNOSIS — M9901 Segmental and somatic dysfunction of cervical region: Secondary | ICD-10-CM | POA: Diagnosis not present

## 2023-09-13 DIAGNOSIS — M9903 Segmental and somatic dysfunction of lumbar region: Secondary | ICD-10-CM | POA: Diagnosis not present

## 2023-09-13 DIAGNOSIS — M62830 Muscle spasm of back: Secondary | ICD-10-CM | POA: Diagnosis not present

## 2023-09-13 DIAGNOSIS — M5441 Lumbago with sciatica, right side: Secondary | ICD-10-CM | POA: Diagnosis not present

## 2023-09-18 DIAGNOSIS — M5441 Lumbago with sciatica, right side: Secondary | ICD-10-CM | POA: Diagnosis not present

## 2023-09-18 DIAGNOSIS — M9901 Segmental and somatic dysfunction of cervical region: Secondary | ICD-10-CM | POA: Diagnosis not present

## 2023-09-18 DIAGNOSIS — M62830 Muscle spasm of back: Secondary | ICD-10-CM | POA: Diagnosis not present

## 2023-09-18 DIAGNOSIS — M9903 Segmental and somatic dysfunction of lumbar region: Secondary | ICD-10-CM | POA: Diagnosis not present

## 2023-09-20 ENCOUNTER — Telehealth: Payer: Self-pay | Admitting: Cardiovascular Disease

## 2023-09-20 NOTE — Telephone Encounter (Signed)
  Patient sent message via mychart asking for a call back since she missed your call

## 2023-09-20 NOTE — Telephone Encounter (Signed)
 Called and left voice message to call back

## 2023-09-20 NOTE — Telephone Encounter (Signed)
 Will route this call to Dr. Gerlene Koh office at [facility] location.

## 2023-09-20 NOTE — Telephone Encounter (Signed)
 Called and spoke to patient. Patient verified with 2 identifiers (DOB and Last Name)  Patient states:  - Pt's daughter and best friend passed away recently. Has been dealing a lot of stress.  - Pt has felt some increased anxiety lately.  - Arrhythmias have not been very "dramatic" ; pt only takes Atenolol  PRN.  - Pt feels like her BP has been a bit off.  - BP yesterday: 136/64 - 64; pt took a hot shower and had not eaten anything: 88/52 - 76; after eating pretzels and drinking water at 11 am: 130/61  Nurse's Recommendations:  - Advised to eat something prior to taking shower, hypotension could be from dehydration and hot water contribution. Eat something salty when BP is low so BP can rise.  - Manage stress regular; anxiety could be contributor to hypotension.  - Will send to MD/APP for review.  - Pt scheduled to APP in Aug.  - Will add onto cancellation list.  Patient verbalized understanding.

## 2023-09-20 NOTE — Telephone Encounter (Signed)
  Per Mychart scheduling message:  Pt c/o BP issue: STAT if pt c/o blurred vision, one-sided weakness or slurred speech.  STAT if BP is GREATER than 180/120 TODAY.  STAT if BP is LESS than 90/60 and SYMPTOMATIC TODAY  1. What is your BP concern?   2. Have you taken any BP medication today?  3. What are your last 5 BP readings?  4. Are you having any other symptoms (ex. Dizziness, headache, blurred vision, passed out)?    My concern is my BP readings at times are very low. I have not taken any blood pressure medication today. I rarely take 1/4 of a 25mg  Atenolol  for arrythmia or if Heart Rate is highish otherwise no blood pressure medication is taken.   in bed this morning:  136/64/64 after shower this morning 88/52/76 yesterday :    103/60/72 ;  119/56/59; 91/54/78    I am quite fatigued but also not sleeping well. my head feels light headed at times.

## 2023-09-21 NOTE — Telephone Encounter (Signed)
 Sorry to hear about her recent difficulties. Hot showers can lower blood pressure as they vasodilate. Would recommend lukewarm showers. Important to eat regular meals and stay well hydrated to prevent low blood pressures. Agree with plan for office visit.   Aleeta Schmaltz S Jessah Danser, NP

## 2023-09-21 NOTE — Telephone Encounter (Signed)
 Called and spoke to pt. Discussed APP's recommendations. Pt verbalized understanding.

## 2023-09-25 DIAGNOSIS — M5441 Lumbago with sciatica, right side: Secondary | ICD-10-CM | POA: Diagnosis not present

## 2023-09-25 DIAGNOSIS — M9903 Segmental and somatic dysfunction of lumbar region: Secondary | ICD-10-CM | POA: Diagnosis not present

## 2023-09-25 DIAGNOSIS — M9901 Segmental and somatic dysfunction of cervical region: Secondary | ICD-10-CM | POA: Diagnosis not present

## 2023-09-25 DIAGNOSIS — M62830 Muscle spasm of back: Secondary | ICD-10-CM | POA: Diagnosis not present

## 2023-10-02 DIAGNOSIS — M62830 Muscle spasm of back: Secondary | ICD-10-CM | POA: Diagnosis not present

## 2023-10-02 DIAGNOSIS — M9901 Segmental and somatic dysfunction of cervical region: Secondary | ICD-10-CM | POA: Diagnosis not present

## 2023-10-02 DIAGNOSIS — M5441 Lumbago with sciatica, right side: Secondary | ICD-10-CM | POA: Diagnosis not present

## 2023-10-02 DIAGNOSIS — M9903 Segmental and somatic dysfunction of lumbar region: Secondary | ICD-10-CM | POA: Diagnosis not present

## 2023-10-09 DIAGNOSIS — M5441 Lumbago with sciatica, right side: Secondary | ICD-10-CM | POA: Diagnosis not present

## 2023-10-09 DIAGNOSIS — M62830 Muscle spasm of back: Secondary | ICD-10-CM | POA: Diagnosis not present

## 2023-10-09 DIAGNOSIS — M9903 Segmental and somatic dysfunction of lumbar region: Secondary | ICD-10-CM | POA: Diagnosis not present

## 2023-10-09 DIAGNOSIS — M9901 Segmental and somatic dysfunction of cervical region: Secondary | ICD-10-CM | POA: Diagnosis not present

## 2023-10-16 DIAGNOSIS — E039 Hypothyroidism, unspecified: Secondary | ICD-10-CM | POA: Diagnosis not present

## 2023-10-16 DIAGNOSIS — M5441 Lumbago with sciatica, right side: Secondary | ICD-10-CM | POA: Diagnosis not present

## 2023-10-16 DIAGNOSIS — M810 Age-related osteoporosis without current pathological fracture: Secondary | ICD-10-CM | POA: Diagnosis not present

## 2023-10-16 DIAGNOSIS — E785 Hyperlipidemia, unspecified: Secondary | ICD-10-CM | POA: Diagnosis not present

## 2023-10-16 DIAGNOSIS — M9901 Segmental and somatic dysfunction of cervical region: Secondary | ICD-10-CM | POA: Diagnosis not present

## 2023-10-16 DIAGNOSIS — Z1212 Encounter for screening for malignant neoplasm of rectum: Secondary | ICD-10-CM | POA: Diagnosis not present

## 2023-10-16 DIAGNOSIS — M9903 Segmental and somatic dysfunction of lumbar region: Secondary | ICD-10-CM | POA: Diagnosis not present

## 2023-10-16 DIAGNOSIS — M62830 Muscle spasm of back: Secondary | ICD-10-CM | POA: Diagnosis not present

## 2023-10-23 DIAGNOSIS — M62830 Muscle spasm of back: Secondary | ICD-10-CM | POA: Diagnosis not present

## 2023-10-23 DIAGNOSIS — M9901 Segmental and somatic dysfunction of cervical region: Secondary | ICD-10-CM | POA: Diagnosis not present

## 2023-10-23 DIAGNOSIS — M9903 Segmental and somatic dysfunction of lumbar region: Secondary | ICD-10-CM | POA: Diagnosis not present

## 2023-10-23 DIAGNOSIS — M5441 Lumbago with sciatica, right side: Secondary | ICD-10-CM | POA: Diagnosis not present

## 2023-10-25 DIAGNOSIS — Z0000 Encounter for general adult medical examination without abnormal findings: Secondary | ICD-10-CM | POA: Diagnosis not present

## 2023-10-25 DIAGNOSIS — Z1339 Encounter for screening examination for other mental health and behavioral disorders: Secondary | ICD-10-CM | POA: Diagnosis not present

## 2023-10-25 DIAGNOSIS — R82998 Other abnormal findings in urine: Secondary | ICD-10-CM | POA: Diagnosis not present

## 2023-10-25 DIAGNOSIS — Z1331 Encounter for screening for depression: Secondary | ICD-10-CM | POA: Diagnosis not present

## 2023-10-25 DIAGNOSIS — G4700 Insomnia, unspecified: Secondary | ICD-10-CM | POA: Diagnosis not present

## 2023-10-30 DIAGNOSIS — M5441 Lumbago with sciatica, right side: Secondary | ICD-10-CM | POA: Diagnosis not present

## 2023-10-30 DIAGNOSIS — M62830 Muscle spasm of back: Secondary | ICD-10-CM | POA: Diagnosis not present

## 2023-10-30 DIAGNOSIS — M9903 Segmental and somatic dysfunction of lumbar region: Secondary | ICD-10-CM | POA: Diagnosis not present

## 2023-10-30 DIAGNOSIS — M9901 Segmental and somatic dysfunction of cervical region: Secondary | ICD-10-CM | POA: Diagnosis not present

## 2023-11-06 DIAGNOSIS — M9901 Segmental and somatic dysfunction of cervical region: Secondary | ICD-10-CM | POA: Diagnosis not present

## 2023-11-06 DIAGNOSIS — M9903 Segmental and somatic dysfunction of lumbar region: Secondary | ICD-10-CM | POA: Diagnosis not present

## 2023-11-06 DIAGNOSIS — M62830 Muscle spasm of back: Secondary | ICD-10-CM | POA: Diagnosis not present

## 2023-11-06 DIAGNOSIS — M5441 Lumbago with sciatica, right side: Secondary | ICD-10-CM | POA: Diagnosis not present

## 2023-11-07 DIAGNOSIS — H52223 Regular astigmatism, bilateral: Secondary | ICD-10-CM | POA: Diagnosis not present

## 2023-11-13 DIAGNOSIS — M62830 Muscle spasm of back: Secondary | ICD-10-CM | POA: Diagnosis not present

## 2023-11-13 DIAGNOSIS — M5441 Lumbago with sciatica, right side: Secondary | ICD-10-CM | POA: Diagnosis not present

## 2023-11-13 DIAGNOSIS — M9901 Segmental and somatic dysfunction of cervical region: Secondary | ICD-10-CM | POA: Diagnosis not present

## 2023-11-13 DIAGNOSIS — R300 Dysuria: Secondary | ICD-10-CM | POA: Diagnosis not present

## 2023-11-13 DIAGNOSIS — M9903 Segmental and somatic dysfunction of lumbar region: Secondary | ICD-10-CM | POA: Diagnosis not present

## 2023-11-16 DIAGNOSIS — M9901 Segmental and somatic dysfunction of cervical region: Secondary | ICD-10-CM | POA: Diagnosis not present

## 2023-11-16 DIAGNOSIS — M9905 Segmental and somatic dysfunction of pelvic region: Secondary | ICD-10-CM | POA: Diagnosis not present

## 2023-11-16 DIAGNOSIS — M9903 Segmental and somatic dysfunction of lumbar region: Secondary | ICD-10-CM | POA: Diagnosis not present

## 2023-11-16 DIAGNOSIS — M9902 Segmental and somatic dysfunction of thoracic region: Secondary | ICD-10-CM | POA: Diagnosis not present

## 2023-11-20 DIAGNOSIS — M5441 Lumbago with sciatica, right side: Secondary | ICD-10-CM | POA: Diagnosis not present

## 2023-11-20 DIAGNOSIS — M9903 Segmental and somatic dysfunction of lumbar region: Secondary | ICD-10-CM | POA: Diagnosis not present

## 2023-11-20 DIAGNOSIS — M62830 Muscle spasm of back: Secondary | ICD-10-CM | POA: Diagnosis not present

## 2023-11-20 DIAGNOSIS — M9901 Segmental and somatic dysfunction of cervical region: Secondary | ICD-10-CM | POA: Diagnosis not present

## 2023-11-29 DIAGNOSIS — M5441 Lumbago with sciatica, right side: Secondary | ICD-10-CM | POA: Diagnosis not present

## 2023-11-29 DIAGNOSIS — M9901 Segmental and somatic dysfunction of cervical region: Secondary | ICD-10-CM | POA: Diagnosis not present

## 2023-11-29 DIAGNOSIS — M62830 Muscle spasm of back: Secondary | ICD-10-CM | POA: Diagnosis not present

## 2023-11-29 DIAGNOSIS — M9903 Segmental and somatic dysfunction of lumbar region: Secondary | ICD-10-CM | POA: Diagnosis not present

## 2023-12-04 DIAGNOSIS — M9901 Segmental and somatic dysfunction of cervical region: Secondary | ICD-10-CM | POA: Diagnosis not present

## 2023-12-04 DIAGNOSIS — M62830 Muscle spasm of back: Secondary | ICD-10-CM | POA: Diagnosis not present

## 2023-12-04 DIAGNOSIS — M9903 Segmental and somatic dysfunction of lumbar region: Secondary | ICD-10-CM | POA: Diagnosis not present

## 2023-12-04 DIAGNOSIS — Z1231 Encounter for screening mammogram for malignant neoplasm of breast: Secondary | ICD-10-CM | POA: Diagnosis not present

## 2023-12-04 DIAGNOSIS — M5441 Lumbago with sciatica, right side: Secondary | ICD-10-CM | POA: Diagnosis not present

## 2023-12-06 DIAGNOSIS — M9901 Segmental and somatic dysfunction of cervical region: Secondary | ICD-10-CM | POA: Diagnosis not present

## 2023-12-06 DIAGNOSIS — M62830 Muscle spasm of back: Secondary | ICD-10-CM | POA: Diagnosis not present

## 2023-12-06 DIAGNOSIS — M5441 Lumbago with sciatica, right side: Secondary | ICD-10-CM | POA: Diagnosis not present

## 2023-12-06 DIAGNOSIS — M9903 Segmental and somatic dysfunction of lumbar region: Secondary | ICD-10-CM | POA: Diagnosis not present

## 2023-12-10 NOTE — Progress Notes (Signed)
 Cardiology Office Note   Date:  12/11/2023  ID:  Caya M Hayes, DOB 05-Jul-1945, MRN [id number] PCP: Avva, Ravisankar, MD  [facility] Providers Cardiologist:  Annabella Scarce, MD     History of Present Illness ADA HOLNESS is a 78 y.o. female with a hx of CAD, arthritis, diverticulosis, hepatic hemangioma, osteopenia, HTN, HLD, moderate PAH, MVP.   Seen 11/30/2021 by Dr. Scarce with BP not at goal. Atenolol  transitioned to Carvedilol . However, she felt heart rate not well controlled and returned to Atenolol . At follow up 12/30/21 Amlodipine  added for BP control. Cardiac CTA ordered due to family history of coronary disease. Cardiac CTA coronary calcium  score of 121 placing her at the 59th percentile for age, sex, race matched control.  Minimal nonobstructive CAD (1-24%) in LAD, LCx.    She was seen 03/01/22 doing overall well and blood pressure was well controlled without Amlodipine . Zetia  was added to her Atorvastatin  regimen. However, she noted headache and had to discontinue.   She wore monitor 06/2022 with PAC 23.2%, PVC <1%, 4 beats NSVt. She was seen by Dr. Fernande and eventually transitioned ot Flecainide  25mg  BID. ETT 08/2023 no exericse induced arrhythmias. Flecainide  later stopped due to alopecia and Diltiazem  30mg  PRN initiated. However, did not tolerate with chest pressure and she resumed PRN Atenolol . Echo 11/2022 LVEF 55-60%, milc LVH, gr1dd, mild MVP, mild MR.    Presents today for follow-up independently.  Notes more palpitations which she attributes to recent stressors.  Her youngest brother's son who is 48 years old recently had MI with 100% LAD stenosis and is presently hospitalized just having had heart pump removed. Does not feel Atenolol  helped much with palpitations as it does significantly lower her blood pressure. BP at home has been labile. This morning 98/68 then an hour later 119/65.  No significant hypertension.  Stays active with her grandchildren, volunteering at  her church, volunteering with ESL students. Labs  09/2023 total cholesterol 168, HDL 66, LDL 84, triglycerides 92.  ROS: Please see the history of present illness.    All other systems reviewed and are negative.   Studies Reviewed EKG Interpretation Date/Time:  Tuesday December 11 2023 08:03:18 EDT Ventricular Rate:  73 PR Interval:  180 QRS Duration:  74 QT Interval:  408 QTC Calculation: 449 R Axis:   53  Text Interpretation: Sinus rhythm with Premature atrial complexes No acute ST/T wave changes Confirmed by Vannie Mora ([id number]) on 12/11/2023 8:16:41 AM    Cardiac Studies & Procedures   ______________________________________________________________________________________________   STRESS TESTS  EXERCISE TOLERANCE TEST (ETT) 09/05/2022  Interpretation Summary No exercise induced arrhtyhmia  a few PACs and PVCs were seen, the lastter partciularly in recovery  Continue flecainide    ECHOCARDIOGRAM  ECHOCARDIOGRAM COMPLETE 12/06/2022  Narrative ECHOCARDIOGRAM REPORT    Patient Name:   DONNALYN JURAN Date of Exam: 12/06/2022 Medical Rec #:  [id number]      Height:       63.5 in Accession #:    [id number]     Weight:       124.0 lb Date of Birth:  05/25/1945      BSA:          1.587 m Patient Age:    76 years       BP:           125/70 mmHg Patient Gender: F              HR:  77 bpm. Exam Location:  Outpatient  Procedure: 2D Echo, 3D Echo, Color Doppler, Cardiac Doppler and Strain Analysis  Indications:    MVP  History:        Patient has prior history of Echocardiogram examinations, most recent 12/06/2021. Arrythmias:PVC; Risk Factors:Hypertension, Non-Smoker and Dyslipidemia. PHTN.  Sonographer:    Orvil Holmes RDCS Referring Phys: [id number] Charitie Hinote S Amariss Detamore  IMPRESSIONS   1. Left ventricular ejection fraction, by estimation, is 55 to 60%. The left ventricle has normal function. The left ventricle has no regional wall motion abnormalities. There is mild  asymmetric left ventricular hypertrophy of the basal-septal segment. Left ventricular diastolic parameters are consistent with Grade I diastolic dysfunction (impaired relaxation). 2. Right ventricular systolic function is normal. The right ventricular size is normal. 3. The mitral valve is normal in structure. Mild mitral valve regurgitation. No evidence of mitral stenosis. There is mild prolapse of both leaflets of the mitral valve. 4. Tricuspid valve regurgitation is mild to moderate. 5. The aortic valve is tricuspid. Aortic valve regurgitation is not visualized. No aortic stenosis is present. 6. The inferior vena cava is normal in size with greater than 50% respiratory variability, suggesting right atrial pressure of 3 mmHg.  FINDINGS Left Ventricle: Left ventricular ejection fraction, by estimation, is 55 to 60%. The left ventricle has normal function. The left ventricle has no regional wall motion abnormalities. Global longitudinal strain performed but not reported based on interpreter judgement due to suboptimal tracking. The left ventricular internal cavity size was normal in size. There is mild asymmetric left ventricular hypertrophy of the basal-septal segment. Left ventricular diastolic parameters are consistent with Grade I diastolic dysfunction (impaired relaxation).  Right Ventricle: The right ventricular size is normal. No increase in right ventricular wall thickness. Right ventricular systolic function is normal.  Left Atrium: Left atrial size was normal in size.  Right Atrium: Right atrial size was normal in size.  Pericardium: There is no evidence of pericardial effusion. Presence of epicardial fat layer.  Mitral Valve: The mitral valve is normal in structure. There is mild prolapse of both leaflets of the mitral valve. Mild mitral valve regurgitation. No evidence of mitral valve stenosis.  Tricuspid Valve: The tricuspid valve is normal in structure. Tricuspid valve  regurgitation is mild to moderate. No evidence of tricuspid stenosis.  Aortic Valve: The aortic valve is tricuspid. Aortic valve regurgitation is not visualized. No aortic stenosis is present.  Pulmonic Valve: The pulmonic valve was not well visualized. Pulmonic valve regurgitation is not visualized. No evidence of pulmonic stenosis.  Aorta: The aortic root is normal in size and structure.  Venous: The inferior vena cava is normal in size with greater than 50% respiratory variability, suggesting right atrial pressure of 3 mmHg.  IAS/Shunts: No atrial level shunt detected by color flow Doppler.   LEFT VENTRICLE PLAX 2D LVIDd:         4.11 cm      Diastology LVIDs:         2.36 cm      LV e' medial:    5.98 cm/s LV PW:         0.95 cm      LV E/e' medial:  9.1 LV IVS:        1.09 cm      LV e' lateral:   5.66 cm/s LVOT diam:     2.00 cm      LV E/e' lateral: 9.6 LV SV:  46 LV SV Index:   29 LVOT Area:     3.14 cm  3D Volume EF: LV Volumes (MOD)            3D EF:        54 % LV vol d, MOD A2C: 88.2 ml  LV EDV:       87 ml LV vol d, MOD A4C: 107.0 ml LV ESV:       40 ml LV vol s, MOD A2C: 35.2 ml  LV SV:        47 ml LV vol s, MOD A4C: 38.6 ml LV SV MOD A2C:     53.0 ml LV SV MOD A4C:     107.0 ml LV SV MOD BP:      59.6 ml  RIGHT VENTRICLE RV Basal diam:  3.18 cm RV Mid diam:    2.63 cm RV S prime:     12.10 cm/s TAPSE (M-mode): 2.8 cm  LEFT ATRIUM           Index        RIGHT ATRIUM           Index LA diam:      3.60 cm 2.27 cm/m   RA Area:     10.50 cm LA Vol (A2C): 49.2 ml 30.99 ml/m  RA Volume:   22.00 ml  13.86 ml/m LA Vol (A4C): 43.2 ml 27.21 ml/m AORTIC VALVE LVOT Vmax:   71.80 cm/s LVOT Vmean:  47.700 cm/s LVOT VTI:    0.145 m  AORTA Ao Root diam: 3.20 cm Ao Asc diam:  3.00 cm  MITRAL VALVE               TRICUSPID VALVE MV Area (PHT): 3.60 cm    TR Peak grad:   33.4 mmHg MV Decel Time: 211 msec    TR Vmax:        289.00 cm/s MR Peak grad:  107.3 mmHg MR Vmax:      518.00 cm/s  SHUNTS MV E velocity: 54.50 cm/s  Systemic VTI:  0.14 m MV A velocity: 98.50 cm/s  Systemic Diam: 2.00 cm MV E/A ratio:  0.55  Kardie Tobb DO Electronically signed by Dub Huntsman DO Signature Date/Time: 12/06/2022/10:09:11 AM    Final    MONITORS  LONG TERM MONITOR (3-14 DAYS) 06/29/2022  Narrative 7 Day Zio Monitor  Quality: Fair.  Baseline artifact. Predominant rhythm: sinus rhythm Average heart rate: 68 bpm Max heart rate: 115 bpm Min heart rate: 45 bpm Pauses >2.5 seconds: none  29 episodes of SVT vs. Atrial tachycardia.  Fastest interval 132 bpm.  Longest episode 15 beats. Frequent (23.2%) PACs Rare (<1%) PVCs 4 beats NSVT  Tiffany C. Raford, MD, [facility] 07/14/2022 11:02 PM   CT SCANS  CT CORONARY MORPH W/CTA COR W/SCORE 01/18/2022  Addendum 01/18/2022  4:44 PM ADDENDUM REPORT: 01/18/2022 16:42  CLINICAL DATA:  This over-read does not include interpretation of cardiac or coronary anatomy or pathology. The coronary CTA interpretation by the cardiologist is attached.  COMPARISON:  None available.  FINDINGS: No suspicious nodules, masses, or infiltrates are identified in the visualized portion of the lungs. No pleural fluid seen.  The visualized portions of the mediastinum and chest wall are unremarkable.  IMPRESSION: No significant non-cardiac abnormality identified.   Electronically Signed By: Norleen DELENA Kil M.D. On: 01/18/2022 16:42  Narrative CLINICAL DATA:  78 Year-old White Female  EXAM: Cardiac/Coronary  CTA  TECHNIQUE: The patient was scanned on  a [employer].  FINDINGS: Scan was triggered in the descending thoracic aorta. Axial non-contrast 3 mm slices were carried out through the heart. The data set was analyzed on a dedicated work station and scored using the Agatson method. Gantry rotation speed was 250 msecs and collimation was .6 mm. 0.8 mg of sl NTG was given. The 3D data set was  reconstructed in 5% intervals of the 67-82 % of the R-R cycle. Diastolic phases were analyzed on a dedicated work station using MPR, MIP and VRT modes. The patient received 100 cc of contrast.  Coronary Arteries:  Normal coronary origin.  Left dominance.  Coronary Calcium  Score:  Left main: 0  Left anterior descending artery: 41  Left circumflex artery: 80  Right coronary artery: 0  Total: 121  Percentile: 59th for age, sex, and race matched control.  RCA is a small non-dominant artery.  There is no significant plaque.  Left main is a large artery that gives rise to LAD and LCX arteries. There is no significant plaque.  LAD is a large vessel that gives rise to multiple diagonal vessels. Minimal non-obstructive calcified plaques (1-24%) in the proximal and mid LAD.  LCX is a dominant artery that gives rise multiple OM vessels and an L-PDA. Minimal non-obstructive calcified plaques (1-24%) in the mid LCX.  Other findings:  Aorta: Normal size.  Aortic atherosclerosis.  No dissection.  Main Pulmonary Artery: Normal size of the pulmonary artery.  Systemic Veins: Normal drainage  Aortic Valve:  Tri-leaflet.  No calcifications.  Mitral valve: Minimal calcification  Normal pulmonary vein drainage into the left atrium.  Normal left atrial appendage without a thrombus.  Interatrial septum with no communications  Left Ventricle: Normal size  Left Atrium: Mild dilation  Right Ventricle: Normal size  Right Atrium: Normal size  Pericardium: Normal thickness  Extra-cardiac findings: See attached radiology report for non-cardiac structures.  Artifact: Cardiac motion slab artifact  IMPRESSION: 1. Coronary calcium  score of 121. This was 59th percentile for age, sex, and race matched control.  2. CAD-RADS 1. Minimal non-obstructive CAD (1-24%). Consider non-atherosclerotic causes of chest pain. Consider preventive therapy and risk factor modification.  3. Aortic  atherosclerosis.  RECOMMENDATIONS: RECOMMENDATIONS The proposed cut-off value of 1,651 AU yielded a 93 % sensitivity and 75 % specificity in grading AS severity in patients with classical low-flow, low-gradient AS. Proposed different cut-off values to define severe AS for men and women as 2,065 AU and 1,274 AU, respectively. The joint European and American recommendations for the assessment of AS consider the aortic valve calcium  score as a continuum - a very high calcium  score suggests severe AS and a low calcium  score suggests severe AS is unlikely.  Donney VEAR Jarome LULLA Stephen RENETTE, et al. 2017 ESC/EACTS Guidelines for the management of valvular heart disease. Eur Heart J 2017;38:2739-91.  Coronary artery calcium  (CAC) score is a strong predictor of incident coronary heart disease (CHD) and provides predictive information beyond traditional risk factors. CAC scoring is reasonable to use in the decision to withhold, postpone, or initiate statin therapy in intermediate-risk or selected borderline-risk asymptomatic adults (age 7-75 years and LDL-C >=70 to <190 mg/dL) who do not have diabetes or established atherosclerotic cardiovascular disease (ASCVD).* In intermediate-risk (10-year ASCVD risk >=7.5% to <20%) adults or selected borderline-risk (10-year ASCVD risk >=5% to <7.5%) adults in whom a CAC score is measured for the purpose of making a treatment decision the following recommendations have been made:  If CAC = 0, it  is reasonable to withhold statin therapy and reassess in 5 to 10 years, as long as higher risk conditions are absent (diabetes mellitus, family history of premature CHD in first degree relatives (males <55 years; females <65 years), cigarette smoking, LDL >=190 mg/dL or other independent risk factors).  If CAC is 1 to 99, it is reasonable to initiate statin therapy for patients >=40 years of age.  If CAC is >=100 or >=75th percentile, it is reasonable to  initiate statin therapy at any age.  Cardiology referral should be considered for patients with CAC scores =400 or >=75th percentile.  *2018 AHA/ACC/AACVPR/AAPA/ABC/ACPM/ADA/AGS/APhA/ASPC/NLA/PCNA Guideline on the Management of Blood Cholesterol: A Report of the American College of Cardiology/American Heart Association Task Force on Clinical Practice Guidelines. J Am Coll Cardiol. 2019;73(24):3168-3209.  Stanly Leavens, MD  Electronically Signed: By: Stanly Leavens M.D. On: 01/18/2022 15:50     ______________________________________________________________________________________________      Risk Assessment/Calculations           Physical Exam VS:  BP 119/65 Comment: home BP  Pulse 75   Ht 5' 3.5 (1.613 m)   Wt 127 lb 12.8 oz (58 kg)   SpO2 99%   BMI 22.28 kg/m        Wt Readings from Last 3 Encounters:  12/11/23 127 lb 12.8 oz (58 kg)  12/20/22 126 lb 9.6 oz (57.4 kg)  11/28/22 124 lb (56.2 kg)    GEN: Well nourished, well developed in no acute distress NECK: No JVD; No carotid bruits CARDIAC: RRR, no murmurs, rubs, gallops RESPIRATORY:  Clear to auscultation without rales, wheezing or rhonchi  ABDOMEN: Soft, non-tender, non-distended EXTREMITIES:  No edema; No deformity   ASSESSMENT AND PLAN  PAC / SVT - more palpitations recently which she attributes to anxiety. Has PRN Atenolol  which she uses very sparingly due to relative hypotension at home. She avoids caffeine, hydrates well. Has tried multiple anxiolytics with PCP which she unfortunately did not tolerate.  Previously did not tolerate flecainide  with alopecia.  Per Dr. Celine previous notes for neck step would be propafenone.  She does not feel her symptoms are severe enough for medication changes at this time.  If needed in the future will refer to Dr. Kennyth of EP.  HTN - BP controlled to hypotensive by home BP readings. No near syncope, syncope. No indication for antihypertensive agent  at this time.   MVP / MR - 12/06/22 LVEF 55 to 60%, mild asymmetric LVH, grade 1 diastolic dysfunction, mild prolapse of both leaflets of the mitral valve with mild MR, mild to moderate TR.  Continue optimal BP control, as above.  Management of palpitations, as above.  Plan for repeat echocardiogram in 1 year prior to office visit.  CAD / HLD, LDL goal <70 - Stable with no anginal symptoms. No indication for ischemic evaluation.  GDMT aspirin  81 mg daily, atorvastatin  5 mg daily.  Previously did not tolerate Zetia  due to headache, abdominal pain.  Rx Nexletol  180 mg daily.  2-week sample provided.  MyChart message in 2 weeks to check in and see if tolerating. If tolerating, will plan to repeat lipid panel in 2 months.       Dispo: follow up in 1 year  Signed, Reche GORMAN Finder, NP

## 2023-12-11 ENCOUNTER — Encounter (HOSPITAL_BASED_OUTPATIENT_CLINIC_OR_DEPARTMENT_OTHER): Payer: Self-pay

## 2023-12-11 ENCOUNTER — Encounter (HOSPITAL_BASED_OUTPATIENT_CLINIC_OR_DEPARTMENT_OTHER): Payer: Self-pay | Admitting: Family

## 2023-12-11 ENCOUNTER — Ambulatory Visit (HOSPITAL_BASED_OUTPATIENT_CLINIC_OR_DEPARTMENT_OTHER): Admitting: Family

## 2023-12-11 VITALS — BP 119/65 | HR 75 | Ht 63.5 in | Wt 127.8 lb

## 2023-12-11 DIAGNOSIS — E785 Hyperlipidemia, unspecified: Secondary | ICD-10-CM

## 2023-12-11 DIAGNOSIS — I491 Atrial premature depolarization: Secondary | ICD-10-CM | POA: Diagnosis not present

## 2023-12-11 DIAGNOSIS — M9903 Segmental and somatic dysfunction of lumbar region: Secondary | ICD-10-CM | POA: Diagnosis not present

## 2023-12-11 DIAGNOSIS — I10 Essential (primary) hypertension: Secondary | ICD-10-CM | POA: Diagnosis not present

## 2023-12-11 DIAGNOSIS — I341 Nonrheumatic mitral (valve) prolapse: Secondary | ICD-10-CM | POA: Diagnosis not present

## 2023-12-11 DIAGNOSIS — R002 Palpitations: Secondary | ICD-10-CM | POA: Diagnosis not present

## 2023-12-11 DIAGNOSIS — M62830 Muscle spasm of back: Secondary | ICD-10-CM | POA: Diagnosis not present

## 2023-12-11 DIAGNOSIS — M9901 Segmental and somatic dysfunction of cervical region: Secondary | ICD-10-CM | POA: Diagnosis not present

## 2023-12-11 DIAGNOSIS — Z5181 Encounter for therapeutic drug level monitoring: Secondary | ICD-10-CM

## 2023-12-11 DIAGNOSIS — M5441 Lumbago with sciatica, right side: Secondary | ICD-10-CM | POA: Diagnosis not present

## 2023-12-11 MED ORDER — NEXLETOL 180 MG PO TABS: 180.0000 mg | ORAL_TABLET | Freq: Every day | ORAL | 0 refills | Status: DC

## 2023-12-11 NOTE — Patient Instructions (Addendum)
 Medication Instructions:  START Nexletol  one tablet daily  We will send you a MyChart message in 2 weeks to check in  *If you need a refill on your cardiac medications before your next appointment, please call your pharmacy*  Testing/Procedures: Your EKG today shows occasional early beat called PAC.  Your physician has requested that you have an echocardiogram in 1 year prior to your office visit. Echocardiography is a painless test that uses sound waves to create images of your heart. It provides your doctor with information about the size and shape of your heart and how well your heart's chambers and valves are working. This procedure takes approximately one hour. There are no restrictions for this procedure. Please do NOT wear cologne, perfume, aftershave, or lotions (deodorant is allowed). Please arrive 15 minutes prior to your appointment time.  Please note: We ask at that you not bring children with you during ultrasound (echo/ vascular) testing. Due to room size and safety concerns, children are not allowed in the ultrasound rooms during exams. Our front office staff cannot provide observation of children in our lobby area while testing is being conducted. An adult accompanying a patient to their appointment will only be allowed in the ultrasound room at the discretion of the ultrasound technician under special circumstances. We apologize for any inconvenience.   Follow-Up: At [facility], you and your health needs are our priority.  As part of our continuing mission to provide you with exceptional heart care, our providers are all part of one team.  This team includes your primary Cardiologist (physician) and Advanced Practice Providers or APPs (Physician Assistants and Nurse Practitioners) who all work together to provide you with the care you need, when you need it.  Your next appointment:   1 year(s)  Provider:   Annabella Scarce, MD, Rosaline Bane, NP, or Reche Finder, NP    We recommend signing up for the patient portal called MyChart.  Sign up information is provided on this After Visit Summary.  MyChart is used to connect with patients for Virtual Visits (Telemedicine).  Patients are able to view lab/test results, encounter notes, upcoming appointments, etc.  Non-urgent messages can be sent to your provider as well.   To learn more about what you can do with MyChart, go to [email or web address].   Other Instructions  Heart Healthy Diet Recommendations: A low-salt diet is recommended. Meats should be grilled, baked, or boiled. Avoid fried foods. Focus on lean protein sources like fish or chicken with vegetables and fruits. The American Heart Association is a Chief Technology Officer!  American Heart Association Diet and Lifeystyle Recommendations   Exercise recommendations: The American Heart Association recommends 150 minutes of moderate intensity exercise weekly. Try 30 minutes of moderate intensity exercise 4-5 times per week. This could include walking, jogging, or swimming.

## 2023-12-18 DIAGNOSIS — M5441 Lumbago with sciatica, right side: Secondary | ICD-10-CM | POA: Diagnosis not present

## 2023-12-18 DIAGNOSIS — M9901 Segmental and somatic dysfunction of cervical region: Secondary | ICD-10-CM | POA: Diagnosis not present

## 2023-12-18 DIAGNOSIS — M62830 Muscle spasm of back: Secondary | ICD-10-CM | POA: Diagnosis not present

## 2023-12-18 DIAGNOSIS — M9903 Segmental and somatic dysfunction of lumbar region: Secondary | ICD-10-CM | POA: Diagnosis not present

## 2023-12-21 MED ORDER — NEXLETOL 180 MG PO TABS: 180.0000 mg | ORAL_TABLET | Freq: Every day | ORAL | 3 refills | Status: DC

## 2023-12-24 ENCOUNTER — Telehealth (HOSPITAL_BASED_OUTPATIENT_CLINIC_OR_DEPARTMENT_OTHER): Payer: Self-pay | Admitting: *Deleted

## 2023-12-24 ENCOUNTER — Other Ambulatory Visit (HOSPITAL_COMMUNITY): Payer: Self-pay

## 2023-12-24 MED ORDER — NEXLETOL 180 MG PO TABS
180.0000 mg | ORAL_TABLET | Freq: Every day | ORAL | 3 refills | Status: AC
Start: 2023-12-24 — End: ?

## 2023-12-24 NOTE — Telephone Encounter (Signed)
 Patient started on Nexletol , will forward to PA to see if PA needed

## 2023-12-24 NOTE — Telephone Encounter (Signed)
PA request has been Submitted.

## 2023-12-25 DIAGNOSIS — M62830 Muscle spasm of back: Secondary | ICD-10-CM | POA: Diagnosis not present

## 2023-12-25 DIAGNOSIS — M9901 Segmental and somatic dysfunction of cervical region: Secondary | ICD-10-CM | POA: Diagnosis not present

## 2023-12-25 DIAGNOSIS — M9903 Segmental and somatic dysfunction of lumbar region: Secondary | ICD-10-CM | POA: Diagnosis not present

## 2023-12-25 DIAGNOSIS — M5441 Lumbago with sciatica, right side: Secondary | ICD-10-CM | POA: Diagnosis not present

## 2023-12-25 NOTE — Telephone Encounter (Signed)
 Pharmacy Patient Advocate Encounter  Received notification from [facility] that Prior Authorization for NEXLETOL  has been DENIED.  Full denial letter will be uploaded to the media tab. See denial reason below.   NON FORM, MUST HAVE TRIED AND FAILED 3 PREFERRED DRUGS (LOVASTATIN, PRAVASTATIN, ROSUVASTATIN, SIMVASTATIN)

## 2023-12-25 NOTE — Telephone Encounter (Signed)
 Will forward to Ronn Melena NP for review

## 2024-01-01 DIAGNOSIS — M62830 Muscle spasm of back: Secondary | ICD-10-CM | POA: Diagnosis not present

## 2024-01-01 DIAGNOSIS — M9903 Segmental and somatic dysfunction of lumbar region: Secondary | ICD-10-CM | POA: Diagnosis not present

## 2024-01-01 DIAGNOSIS — M5441 Lumbago with sciatica, right side: Secondary | ICD-10-CM | POA: Diagnosis not present

## 2024-01-01 DIAGNOSIS — M9901 Segmental and somatic dysfunction of cervical region: Secondary | ICD-10-CM | POA: Diagnosis not present

## 2024-01-02 DIAGNOSIS — Z1331 Encounter for screening for depression: Secondary | ICD-10-CM | POA: Diagnosis not present

## 2024-01-02 DIAGNOSIS — Z01419 Encounter for gynecological examination (general) (routine) without abnormal findings: Secondary | ICD-10-CM | POA: Diagnosis not present

## 2024-01-07 DIAGNOSIS — K080 Exfoliation of teeth due to systemic causes: Secondary | ICD-10-CM | POA: Diagnosis not present

## 2024-01-08 DIAGNOSIS — M5441 Lumbago with sciatica, right side: Secondary | ICD-10-CM | POA: Diagnosis not present

## 2024-01-08 DIAGNOSIS — M62830 Muscle spasm of back: Secondary | ICD-10-CM | POA: Diagnosis not present

## 2024-01-08 DIAGNOSIS — M9901 Segmental and somatic dysfunction of cervical region: Secondary | ICD-10-CM | POA: Diagnosis not present

## 2024-01-08 DIAGNOSIS — M9903 Segmental and somatic dysfunction of lumbar region: Secondary | ICD-10-CM | POA: Diagnosis not present

## 2024-01-15 DIAGNOSIS — M9901 Segmental and somatic dysfunction of cervical region: Secondary | ICD-10-CM | POA: Diagnosis not present

## 2024-01-15 DIAGNOSIS — M62830 Muscle spasm of back: Secondary | ICD-10-CM | POA: Diagnosis not present

## 2024-01-15 DIAGNOSIS — M5441 Lumbago with sciatica, right side: Secondary | ICD-10-CM | POA: Diagnosis not present

## 2024-01-22 DIAGNOSIS — M9901 Segmental and somatic dysfunction of cervical region: Secondary | ICD-10-CM | POA: Diagnosis not present

## 2024-01-22 DIAGNOSIS — M62830 Muscle spasm of back: Secondary | ICD-10-CM | POA: Diagnosis not present

## 2024-01-22 DIAGNOSIS — M9903 Segmental and somatic dysfunction of lumbar region: Secondary | ICD-10-CM | POA: Diagnosis not present

## 2024-01-22 DIAGNOSIS — M5441 Lumbago with sciatica, right side: Secondary | ICD-10-CM | POA: Diagnosis not present

## 2024-01-29 DIAGNOSIS — M62830 Muscle spasm of back: Secondary | ICD-10-CM | POA: Diagnosis not present

## 2024-01-29 DIAGNOSIS — M9901 Segmental and somatic dysfunction of cervical region: Secondary | ICD-10-CM | POA: Diagnosis not present

## 2024-01-29 DIAGNOSIS — M5441 Lumbago with sciatica, right side: Secondary | ICD-10-CM | POA: Diagnosis not present

## 2024-01-29 DIAGNOSIS — M9903 Segmental and somatic dysfunction of lumbar region: Secondary | ICD-10-CM | POA: Diagnosis not present

## 2024-02-05 DIAGNOSIS — M9903 Segmental and somatic dysfunction of lumbar region: Secondary | ICD-10-CM | POA: Diagnosis not present

## 2024-02-05 DIAGNOSIS — M5441 Lumbago with sciatica, right side: Secondary | ICD-10-CM | POA: Diagnosis not present

## 2024-02-05 DIAGNOSIS — M9901 Segmental and somatic dysfunction of cervical region: Secondary | ICD-10-CM | POA: Diagnosis not present

## 2024-02-05 DIAGNOSIS — M62830 Muscle spasm of back: Secondary | ICD-10-CM | POA: Diagnosis not present

## 2024-02-12 DIAGNOSIS — M62830 Muscle spasm of back: Secondary | ICD-10-CM | POA: Diagnosis not present

## 2024-02-12 DIAGNOSIS — M9901 Segmental and somatic dysfunction of cervical region: Secondary | ICD-10-CM | POA: Diagnosis not present

## 2024-02-12 DIAGNOSIS — M9903 Segmental and somatic dysfunction of lumbar region: Secondary | ICD-10-CM | POA: Diagnosis not present

## 2024-02-12 DIAGNOSIS — M5441 Lumbago with sciatica, right side: Secondary | ICD-10-CM | POA: Diagnosis not present

## 2024-02-19 DIAGNOSIS — M5441 Lumbago with sciatica, right side: Secondary | ICD-10-CM | POA: Diagnosis not present

## 2024-02-19 DIAGNOSIS — M62830 Muscle spasm of back: Secondary | ICD-10-CM | POA: Diagnosis not present

## 2024-02-19 DIAGNOSIS — M9903 Segmental and somatic dysfunction of lumbar region: Secondary | ICD-10-CM | POA: Diagnosis not present

## 2024-02-19 DIAGNOSIS — M9901 Segmental and somatic dysfunction of cervical region: Secondary | ICD-10-CM | POA: Diagnosis not present

## 2024-02-26 DIAGNOSIS — M9901 Segmental and somatic dysfunction of cervical region: Secondary | ICD-10-CM | POA: Diagnosis not present

## 2024-02-26 DIAGNOSIS — M9903 Segmental and somatic dysfunction of lumbar region: Secondary | ICD-10-CM | POA: Diagnosis not present

## 2024-02-26 DIAGNOSIS — M5441 Lumbago with sciatica, right side: Secondary | ICD-10-CM | POA: Diagnosis not present

## 2024-02-26 DIAGNOSIS — M62830 Muscle spasm of back: Secondary | ICD-10-CM | POA: Diagnosis not present

## 2024-03-04 DIAGNOSIS — M9903 Segmental and somatic dysfunction of lumbar region: Secondary | ICD-10-CM | POA: Diagnosis not present

## 2024-03-04 DIAGNOSIS — M9901 Segmental and somatic dysfunction of cervical region: Secondary | ICD-10-CM | POA: Diagnosis not present

## 2024-03-04 DIAGNOSIS — M62830 Muscle spasm of back: Secondary | ICD-10-CM | POA: Diagnosis not present

## 2024-03-04 DIAGNOSIS — M5441 Lumbago with sciatica, right side: Secondary | ICD-10-CM | POA: Diagnosis not present

## 2024-03-11 DIAGNOSIS — M62830 Muscle spasm of back: Secondary | ICD-10-CM | POA: Diagnosis not present

## 2024-03-11 DIAGNOSIS — M9903 Segmental and somatic dysfunction of lumbar region: Secondary | ICD-10-CM | POA: Diagnosis not present

## 2024-03-11 DIAGNOSIS — M5441 Lumbago with sciatica, right side: Secondary | ICD-10-CM | POA: Diagnosis not present

## 2024-03-11 DIAGNOSIS — M9901 Segmental and somatic dysfunction of cervical region: Secondary | ICD-10-CM | POA: Diagnosis not present

## 2024-03-20 DIAGNOSIS — M5441 Lumbago with sciatica, right side: Secondary | ICD-10-CM | POA: Diagnosis not present

## 2024-03-20 DIAGNOSIS — M9903 Segmental and somatic dysfunction of lumbar region: Secondary | ICD-10-CM | POA: Diagnosis not present

## 2024-03-20 DIAGNOSIS — M9901 Segmental and somatic dysfunction of cervical region: Secondary | ICD-10-CM | POA: Diagnosis not present

## 2024-03-20 DIAGNOSIS — M62830 Muscle spasm of back: Secondary | ICD-10-CM | POA: Diagnosis not present

## 2024-03-25 DIAGNOSIS — M9901 Segmental and somatic dysfunction of cervical region: Secondary | ICD-10-CM | POA: Diagnosis not present

## 2024-03-25 DIAGNOSIS — M9903 Segmental and somatic dysfunction of lumbar region: Secondary | ICD-10-CM | POA: Diagnosis not present

## 2024-03-25 DIAGNOSIS — M5441 Lumbago with sciatica, right side: Secondary | ICD-10-CM | POA: Diagnosis not present

## 2024-03-25 DIAGNOSIS — M62830 Muscle spasm of back: Secondary | ICD-10-CM | POA: Diagnosis not present

## 2024-04-01 DIAGNOSIS — M9903 Segmental and somatic dysfunction of lumbar region: Secondary | ICD-10-CM | POA: Diagnosis not present

## 2024-04-01 DIAGNOSIS — M5441 Lumbago with sciatica, right side: Secondary | ICD-10-CM | POA: Diagnosis not present

## 2024-04-01 DIAGNOSIS — M9901 Segmental and somatic dysfunction of cervical region: Secondary | ICD-10-CM | POA: Diagnosis not present

## 2024-04-01 DIAGNOSIS — M62830 Muscle spasm of back: Secondary | ICD-10-CM | POA: Diagnosis not present

## 2024-04-08 DIAGNOSIS — M9901 Segmental and somatic dysfunction of cervical region: Secondary | ICD-10-CM | POA: Diagnosis not present

## 2024-04-08 DIAGNOSIS — M5441 Lumbago with sciatica, right side: Secondary | ICD-10-CM | POA: Diagnosis not present

## 2024-04-08 DIAGNOSIS — M62830 Muscle spasm of back: Secondary | ICD-10-CM | POA: Diagnosis not present

## 2024-04-08 DIAGNOSIS — M9903 Segmental and somatic dysfunction of lumbar region: Secondary | ICD-10-CM | POA: Diagnosis not present

## 2024-05-03 ENCOUNTER — Other Ambulatory Visit (HOSPITAL_BASED_OUTPATIENT_CLINIC_OR_DEPARTMENT_OTHER): Payer: Self-pay | Admitting: Cardiovascular Disease
